# Patient Record
Sex: Female | Born: 1980
Health system: Southern US, Community
[De-identification: ages and names within clinical notes are randomized; demographics above are authoritative.]

## PROBLEM LIST (undated history)

## (undated) ENCOUNTER — Inpatient Hospital Stay (HOSPITAL_COMMUNITY): Payer: Self-pay

## (undated) DIAGNOSIS — Z8619 Personal history of other infectious and parasitic diseases: Secondary | ICD-10-CM

## (undated) DIAGNOSIS — Z5189 Encounter for other specified aftercare: Secondary | ICD-10-CM

## (undated) DIAGNOSIS — C719 Malignant neoplasm of brain, unspecified: Secondary | ICD-10-CM

## (undated) DIAGNOSIS — D573 Sickle-cell trait: Secondary | ICD-10-CM

## (undated) DIAGNOSIS — A599 Trichomoniasis, unspecified: Secondary | ICD-10-CM

## (undated) DIAGNOSIS — O09299 Supervision of pregnancy with other poor reproductive or obstetric history, unspecified trimester: Secondary | ICD-10-CM

## (undated) DIAGNOSIS — R87629 Unspecified abnormal cytological findings in specimens from vagina: Secondary | ICD-10-CM

## (undated) DIAGNOSIS — O139 Gestational [pregnancy-induced] hypertension without significant proteinuria, unspecified trimester: Secondary | ICD-10-CM

## (undated) DIAGNOSIS — F419 Anxiety disorder, unspecified: Secondary | ICD-10-CM

## (undated) DIAGNOSIS — N946 Dysmenorrhea, unspecified: Secondary | ICD-10-CM

## (undated) HISTORY — DX: Supervision of pregnancy with other poor reproductive or obstetric history, unspecified trimester: O09.299

## (undated) HISTORY — DX: Unspecified abnormal cytological findings in specimens from vagina: R87.629

## (undated) HISTORY — DX: Anxiety disorder, unspecified: F41.9

## (undated) HISTORY — DX: Personal history of other infectious and parasitic diseases: Z86.19

## (undated) HISTORY — DX: Trichomoniasis, unspecified: A59.9

## (undated) HISTORY — PX: CERVICAL CONE BIOPSY: SUR198

## (undated) HISTORY — DX: Malignant neoplasm of brain, unspecified: C71.9

## (undated) HISTORY — PX: BRAIN SURGERY: SHX531

---

## 2003-03-24 ENCOUNTER — Emergency Department (HOSPITAL_COMMUNITY): Admission: EM | Admit: 2003-03-24 | Discharge: 2003-03-24 | Payer: Self-pay | Admitting: Emergency Medicine

## 2004-06-12 ENCOUNTER — Emergency Department (HOSPITAL_COMMUNITY): Admission: EM | Admit: 2004-06-12 | Discharge: 2004-06-12 | Payer: Self-pay | Admitting: Emergency Medicine

## 2004-08-12 ENCOUNTER — Inpatient Hospital Stay (HOSPITAL_COMMUNITY): Admission: AD | Admit: 2004-08-12 | Discharge: 2004-08-16 | Payer: Self-pay | Admitting: General Surgery

## 2006-02-27 ENCOUNTER — Emergency Department (HOSPITAL_COMMUNITY): Admission: EM | Admit: 2006-02-27 | Discharge: 2006-02-27 | Payer: Self-pay | Admitting: Emergency Medicine

## 2006-09-05 ENCOUNTER — Encounter (INDEPENDENT_AMBULATORY_CARE_PROVIDER_SITE_OTHER): Payer: Self-pay | Admitting: Specialist

## 2006-09-05 ENCOUNTER — Other Ambulatory Visit: Admission: RE | Admit: 2006-09-05 | Discharge: 2006-09-05 | Payer: Self-pay | Admitting: Unknown Physician Specialty

## 2007-11-16 ENCOUNTER — Ambulatory Visit (HOSPITAL_COMMUNITY): Admission: RE | Admit: 2007-11-16 | Discharge: 2007-11-16 | Payer: Self-pay | Admitting: Family Medicine

## 2007-11-20 ENCOUNTER — Ambulatory Visit (HOSPITAL_COMMUNITY): Admission: RE | Admit: 2007-11-20 | Discharge: 2007-11-20 | Payer: Self-pay | Admitting: Family Medicine

## 2008-08-21 ENCOUNTER — Other Ambulatory Visit: Admission: RE | Admit: 2008-08-21 | Discharge: 2008-08-21 | Payer: Self-pay | Admitting: Obstetrics and Gynecology

## 2008-12-20 ENCOUNTER — Emergency Department (HOSPITAL_COMMUNITY): Admission: EM | Admit: 2008-12-20 | Discharge: 2008-12-20 | Payer: Self-pay | Admitting: Emergency Medicine

## 2009-03-12 ENCOUNTER — Ambulatory Visit: Payer: Self-pay | Admitting: Obstetrics & Gynecology

## 2009-03-12 ENCOUNTER — Inpatient Hospital Stay (HOSPITAL_COMMUNITY): Admission: AD | Admit: 2009-03-12 | Discharge: 2009-03-15 | Payer: Self-pay | Admitting: Obstetrics & Gynecology

## 2009-08-30 ENCOUNTER — Emergency Department (HOSPITAL_COMMUNITY): Admission: EM | Admit: 2009-08-30 | Discharge: 2009-08-30 | Payer: Self-pay | Admitting: Emergency Medicine

## 2009-09-07 ENCOUNTER — Emergency Department (HOSPITAL_COMMUNITY): Admission: EM | Admit: 2009-09-07 | Discharge: 2009-09-07 | Payer: Self-pay | Admitting: Emergency Medicine

## 2010-06-07 ENCOUNTER — Emergency Department (HOSPITAL_COMMUNITY)
Admission: EM | Admit: 2010-06-07 | Discharge: 2010-06-07 | Payer: Self-pay | Source: Home / Self Care | Admitting: Emergency Medicine

## 2010-10-15 LAB — CBC
HCT: 26.8 % — ABNORMAL LOW (ref 36.0–46.0)
HCT: 30.6 % — ABNORMAL LOW (ref 36.0–46.0)
MCHC: 34.3 g/dL (ref 30.0–36.0)
MCV: 106.8 fL — ABNORMAL HIGH (ref 78.0–100.0)
Platelets: 198 10*3/uL (ref 150–400)
RBC: 2.47 MIL/uL — ABNORMAL LOW (ref 3.87–5.11)
RDW: 13.3 % (ref 11.5–15.5)
WBC: 9.9 10*3/uL (ref 4.0–10.5)

## 2010-10-15 LAB — RPR: RPR Ser Ql: NONREACTIVE

## 2010-10-18 LAB — URINALYSIS, ROUTINE W REFLEX MICROSCOPIC
Bilirubin Urine: NEGATIVE
Hgb urine dipstick: NEGATIVE
Ketones, ur: NEGATIVE mg/dL
Nitrite: NEGATIVE
pH: 7 (ref 5.0–8.0)

## 2010-10-18 LAB — PREGNANCY, URINE: Preg Test, Ur: POSITIVE

## 2010-10-18 LAB — URINE MICROSCOPIC-ADD ON

## 2010-10-18 LAB — URINE CULTURE

## 2010-11-26 NOTE — Discharge Summary (Signed)
Maureen, Key         ACCOUNT NO.:  [id number]   MEDICAL RECORD NO.:  [id number]          PATIENT TYPE:  INP   LOCATION:  A311                          FACILITY:  [facility]   PHYSICIAN:  Annia Friendly. Loleta Chance, MD     DATE OF BIRTH:  [birth year]   DATE OF ADMISSION:  08/12/2004  DATE OF DISCHARGE:  02/06/2006LH                                 DISCHARGE SUMMARY   HISTORY OF PRESENT ILLNESS:  The patient is a 30 year old, single, gravida  0, unemployed black female from [place], [place].  She complains  of chills, general malaise, nausea, vomiting, diarrhea.  The patient had  been experiencing these symptoms x4 days.  She also experienced similar pain  with three episodes of watery brown stool on August 10, 2004.  History was  significant for runny nose (clear) x3 days, hot/cold shaking spells times  three days, headaches x4 days, nausea and vomiting less than one day.  Vomiting was described as clear.  She denied frequent cough, sore throat,  shortness of breath and wheezing.  The patient was exposed to a friend's  child with diarrhea approximately 5-6 days before admission.  History was  significant for soreness of lower back and stomach.  The patient was not  allergic to any medication.  Last menstrual cycle was on the day of  admission.   PAST SURGICAL HISTORY:  Hospitalization for meningitis at [facility] at age 82.   HABITS:  Positive for cigarette smoking (one pack per day); started at age  37.  Social use of ethanol since age 39.   FAMILY HISTORY:  Mother living at age 87, good health.  Father living at age  66, health unknown.  One sister living at age 73 with history of diabetes.   PROBLEMS:  #1 - Acute viral gastroenteritis.  General appearance revealed a  small frame, medium height, young adult black female in no apparent  respiratory distress.  The patient appeared not to feel well.  Vitals on  admission were as follows:  Temperature 104.2, pulse  128, respirations 20,  blood pressure 102/62.  Skin was hot and dry.  Sclerae were white.  Eyes  demonstrated no discharge.  Nose was negative of discharge.  Mouth  demonstrated piercing ring of tongue.  Posterior pharynx looked benign.  Neck negative for adenopathy or thyromegaly.  Lungs were clear.  Heart  revealed audible S1, S2, without murmur.  Rhythm was regular rate, was 96 at  the time of my exam.  Abdomen demonstrated no distention.  Abdominal exam  demonstrated hypoactive bowel signs.  Abdomen was positive for mid  epigastric tenderness and no palpable mass or organomegaly. Umbilical's  demonstrated piercing ring.  Back demonstrated some diffuse lumbar  tenderness.  Extremities demonstrated no joint swelling, redness or hotness.  Patient neurologically intact.  Significant labs on admission were as  follows: White count 7.4, hemoglobin 13.3, hematocrit 38.1, platelets  207,000, erythrocyte sedimentation rate 24.  Sodium 128, potassium 2.7,  chloride 94, CO2 28, glucose 121, BUN 8, creatinine 0.7, calcium 8.7.  Urinalysis:  Specific gravity 1.015, pH 5.0, no glucose, large amount  of  hemoglobin, ketone 15 mg/dL, protein 30 mg/dL, nitrate negative, 78-46  WBC's, 11-20 RBCs and many bacteria.  The patient was treated with IV  fluids, IV Levaquin, ordered urine culture, stool culture, stool for WBC, IV  fluids at 100 cc per hour x2, then 500 cc per hour x3, then 250 cc per hour,  Tamiflu 75 mg p.o. b.i.d., potassium 20 mEq p.o. at time of admission, 2200  and O800, thyroid panel, HIV test, Pepcid 20 mg IV q.12h., full liquid diet  and other supportive measures.  Urine culture was negative for growth.  Stool culture was negative at time of admission.  Stool did demonstrate  WBCs.  Electrolytes were corrected with IV fluids and potassium supplement.  Repeat met-7 on 08/13/2004 was as follows:  Sodium 140, potassium 4.0,  chloride 114, CO2 23, glucose 90, BUN 4, creatinine 0.7.  The  patient had a  chest x-ray on admission which demonstrated no active lung disease.  Ultrasound of kidney on 08/13/2004 was read as normal bladder.  Right kidney  demonstrated no evidence of hydronephrosis.  Ultrasound of left kidney  demonstrated no evidence of hydronephrosis.  Conclusion was normal urinary  tract ultrasound, specifically, no evidence of pyelonephritis read by Dr.  Hulan Saas.  Erythrocyte sedimentation rate was 24.  The patient  continued to improve during this hospitalization.  HIV test was nonreactive.  Thyroid panel demonstrated the following:  T3 uptake 45.0% (normal 22.5-  37.0), TSH 3.001 (normal 0.35-5.5 mIU) and T4 0.97 ng/dL (normal 9.62-9.52).  The patient's diet was advanced at the end of hospitalization.  She was  observed ambulatory in the hall without any discomfort.  She was keeping  down regular diet at the time of discharge.  She was alert and oriented to  person, place and time at time of discharge.  She had no complaints of  fever, chills or general malaise at time of discharge.  She was discharged  to her home where she lives with her grandmother.  Abdominal exam  demonstrated no significant tenderness at time of discharge.   #2 - Sickle cell screen positive.  The patient had a family history of  sickle cell disease.  Therefore, sickle cell screen was done and it was  positive.  Hemoglobin electrophoresis results were pending at time of  discharge.  Results will be followed as outpatient.   #3 - Street drug used.  Urine drug screen was positive for marijuana.  The  patient will be encouraged to seek counseling at [facility].   #4 - Dysmenorrhea.  During this hospitalization, the patient's menstrual  cycle came on.  She has experienced some discomfort involving back and lower  abdomen at times.  Her menstrual cycle was still on at time of discharge.   DISCHARGE INSTRUCTIONS:  1.  Diet:  Bland. 2.  Activity:  No restrictions.   DISCHARGE  MEDICATIONS:  1.  Zantac 75 mg one tablet b.i.d. (over-the-counter) x1 week.  2.  Tylenol 500 mg one tablet q.6h. p.r.n. pain.   FOLLOWUP:  Follow up with Dr. Harrell Lark Presnell in one week.   PRIMARY DIAGNOSES:  1.  Acute viral gastroenteritis.  2.  Electrolyte abnormality secondary to #1.   SECONDARY DIAGNOSES:  1.  Positive sickle cell screen test.  2.  Street drugs used.      GKH/MEDQ  D:  08/16/2004  T:  08/17/2004  Job:  [id number]

## 2010-11-26 NOTE — H&P (Signed)
NAMEFLORENE, BRILL         ACCOUNT NO.:  [id number]   MEDICAL RECORD NO.:  [id number]          PATIENT TYPE:  INP   LOCATION:  A310                          FACILITY:  [facility]   PHYSICIAN:  Annia Friendly. Loleta Chance, MD     DATE OF BIRTH:  07-11-1981   DATE OF ADMISSION:  08/12/2004  DATE OF DISCHARGE:  LH                                HISTORY & PHYSICAL   The patient is a 30 year old single gravida 0 unemployed black female from  [place], [place].  Chief complaints are chills, general malaise,  nausea, vomiting and diarrhea.   HISTORY OF PRESENT ILLNESS:  Patient has been experiencing these symptoms  over 4 days.  She incurred stomach pain with three episodes of watery brown  stool on August 10, 2004.  History is significant for a runny nose (clear)  x3 days, hot-cold shaking spells x3 days, headache x4 day, nausea and  vomiting less than 1 day.  Vomitus is described as clear.  She denied  frequent cough, sore throat, shortness of breath and wheezing.  Patient was  exposed to friend's child with the diarrhea approximately 5-6 days before  admission.  History is significant for soreness of lower back and stomach.   MEDICAL HISTORY:  Negative for hypertension, diabetes, tuberculosis, cancer,  sickle cell, asthma, seizure disorder.   PRESCRIBED MEDICATIONS ON ADMISSION:  None.   MEDICATION:  The patient is not allergic to any known medication.   LAST MENSTRUAL PERIOD:  Menstrual cycle came on on the day of admission.   PAST MEDICAL HISTORY:  Positive for hospitalization for meningitis at [facility] age 56 year.   HABITS:  Positive for cigarette smoking (six per day, started age 88) and  social use of ethanol (since age 56).  Patient denied using street drugs.   SEXUALLY TRANSMITTED DISEASE HISTORY:  Negative for gonorrhea, syphilis,  herpes and HIV infection.   FAMILY HISTORY:  Mother living age 56 good health; father living age 40  health unknown; one sister  living age 58 with history of diabetes.   REVIEW OF SYSTEMS:  Negative for epistaxis, bleeding gums, dysphagia,  hemoptysis, hematemesis, gross hematuria, melena, joint pain, joint hotness,  joint redness, dysuria, change in urinary frequency, vaginal discharge, etc.   PHYSICAL EXAMINATION:  GENERAL APPEARANCE:  Small frame, medium height,  young adult black female in no apparent respiratory distress.  Patient  appeared not to feel well.  VITALS:  Temperature 104.2, pulse 128, respirations 20, blood pressure  102/62.  SKIN:  Hot and dry.  HEAD:  Normocephalic.  EARS:  Normal auricles, external canals patent, tympanic membranes pearly  gray.  EYES:  Lids negative for ptosis.  Sclerae white.  Pupils round, equal and  reactive to light.  Extraocular movement intact.  Negative for discharge.  NOSE:  Negative for discharge.  MOUTH:  Tongue positive for ring piercing.  No oral lesion.  Posterior  pharynx benign.  NECK:  Negative for lymphadenopathy or thyromegaly.  Positive for tattoo  butterfly like on right side of neck.  Supraclavicular space no palpable  nodes.  LUNGS:  Clear.  BACK:  Lumbar positive for diffuse tenderness on palpation.  Positive for  tattoo in the form of a name.  HEART:  Audible S1/S2 without murmur, regular rhythm, rate of 96.  BREASTS:  No skin changes.  No nodules on palpation.  Nipples erect without  discharge.  ABDOMEN:  No distention.  Hyperactive bowel sounds, soft, positive  midepigastric tenderness.  No palpable masses.  No organomegaly.  Umbilicus  positive for ring piercing.  PELVIC:  Deferred.  RECTAL:  Deferred.  EXTREMITIES:  Upper left hand dorsal aspect positive for tattoo.  Lower no  tibia edema, no joint swelling, no joint redness, no joint hotness.  NEUROLOGIC:  Alert and oriented to person, place and time.  Cranial nerves  II-XII appeared intact.   LABORATORIES:  White count is 7.4, hemoglobin 13.3, hematocrit 38.1,  platelets 207,000,  sodium 128, potassium 2.7, chloride 94, CO2 28, glucose  121, BUN 8, creatinine 0.9.   IMPRESSION:  Primary acute bowel gastroenteritis.   SECONDARY DIAGNOSIS:  Hypokalemia.   PLAN:  IV fluid normal saline at 1000 mL/hr x2 then 500 mL/hr x4 then 250  mL/hr.  Potassium supplement p.o. 20 mEq now at 2300 hours and 0800 hours.  Repeat CBC and MET-7 in the morning.  Tamiflu 75 mg p.o. b.i.d. x5 days.  Pepcid 20 mg IV q.12h.  Diet full liquid.  Tylenol 325 mg p.o. q.4-6h. for  temperature 101 or greater.  Chest x-ray.  Urinalysis.  Erythrocyte  sedimentation rate.  Ultrasound as ordered by Dr. Katrinka Blazing.  Watch I's and O's  (input and output).      GKH/MEDQ  D:  08/12/2004  T:  08/12/2004  Job:  [id number]

## 2012-03-26 ENCOUNTER — Emergency Department (HOSPITAL_COMMUNITY)
Admission: EM | Admit: 2012-03-26 | Discharge: 2012-03-26 | Disposition: A | Discharge status: Home / Self Care | Payer: Self-pay | Attending: Emergency Medicine | Admitting: Emergency Medicine

## 2012-03-26 ENCOUNTER — Encounter (HOSPITAL_COMMUNITY): Payer: Self-pay | Admitting: *Deleted

## 2012-03-26 DIAGNOSIS — F17200 Nicotine dependence, unspecified, uncomplicated: Secondary | ICD-10-CM | POA: Insufficient documentation

## 2012-03-26 DIAGNOSIS — T6391XA Toxic effect of contact with unspecified venomous animal, accidental (unintentional), initial encounter: Secondary | ICD-10-CM | POA: Insufficient documentation

## 2012-03-26 DIAGNOSIS — T63461A Toxic effect of venom of wasps, accidental (unintentional), initial encounter: Secondary | ICD-10-CM | POA: Insufficient documentation

## 2012-03-26 DIAGNOSIS — T63441A Toxic effect of venom of bees, accidental (unintentional), initial encounter: Secondary | ICD-10-CM

## 2012-03-26 MED ORDER — HYDROXYZINE HCL 25 MG PO TABS
25.0000 mg | ORAL_TABLET | Freq: Once | ORAL | Status: AC
  Administered 2012-03-26: 25 mg via ORAL
  Filled 2012-03-26: qty 1

## 2012-03-26 MED ORDER — HYDROXYZINE PAMOATE 25 MG PO CAPS: ORAL_CAPSULE | ORAL | Status: DC

## 2012-03-26 MED ORDER — PREDNISONE 20 MG PO TABS
60.0000 mg | ORAL_TABLET | Freq: Once | ORAL | Status: AC
  Administered 2012-03-26: 60 mg via ORAL
  Filled 2012-03-26: qty 3

## 2012-03-26 MED ORDER — PREDNISONE 10 MG PO TABS: ORAL_TABLET | ORAL | Status: DC

## 2012-03-26 NOTE — ED Notes (Addendum)
Bee sting x2  To  Rt leg, red , swollen, painful.  No resp distress. No rashes.

## 2012-03-26 NOTE — ED Provider Notes (Signed)
History     CSN: [id number]  Arrival date & time 03/26/12  1137   First MD Initiated Contact with Patient 03/26/12 1330      Chief Complaint  Patient presents with  . Insect Bite    (Consider location/radiation/quality/duration/timing/severity/associated sxs/prior treatment) HPI Comments: Just prior to arrival to the emergency room the patient sustained 2E stings while at a gas station. The patient complains of a skin burning sensation in the lower leg at the site of the stings. The patient denies difficulty with breathing, swelling of the face tongue or throat. There've been no reported hives or other rash appreciated. Is no reported problem with previous bee stings.  The history is provided by the patient.    History reviewed. No pertinent past medical history.  History reviewed. No pertinent past surgical history.  History reviewed. No pertinent family history.  History  Substance Use Topics  . Smoking status: Current Every Day Smoker  . Smokeless tobacco: Not on file  . Alcohol Use: Yes     occ    OB History    Grav Para Term Preterm Abortions TAB SAB Ect Mult Living                  Review of Systems  Constitutional: Negative for activity change.       All ROS Neg except as noted in HPI  HENT: Negative for nosebleeds and neck pain.   Eyes: Negative for photophobia and discharge.  Respiratory: Negative for cough, shortness of breath and wheezing.   Cardiovascular: Negative for chest pain and palpitations.  Gastrointestinal: Negative for abdominal pain and blood in stool.  Genitourinary: Negative for dysuria, frequency and hematuria.  Musculoskeletal: Negative for back pain and arthralgias.  Skin: Negative.   Neurological: Negative for dizziness, seizures and speech difficulty.  Psychiatric/Behavioral: Negative for hallucinations and confusion.    Allergies  Review of patient's allergies indicates no known allergies.  Home Medications   Current  Outpatient Rx  Name Route Sig Dispense Refill  . ETONOGESTREL 68 MG [place] IMPL Subcutaneous Inject 1 each into the skin once.    Marland Kitchen HYDROXYZINE PAMOATE 25 MG PO CAPS  1 po q6h prn itching/burning rash 20 capsule 0  . PREDNISONE 10 MG PO TABS  5,4,3,2,1 - take with food 15 tablet 0    BP 99/73  Pulse 94  Temp 99.1 F (37.3 C) (Oral)  Resp 18  Ht 5' (1.524 m)  Wt 105 lb (47.628 kg)  BMI 20.51 kg/m2  SpO2 100%  Physical Exam  Nursing note and vitals reviewed. Constitutional: She is oriented to person, place, and time. She appears well-developed and well-nourished.  Non-toxic appearance.  HENT:  Head: Normocephalic.  Right Ear: Tympanic membrane and external ear normal.  Left Ear: Tympanic membrane and external ear normal.       No facial, mouth, or tongue area swelling.  Eyes: EOM and lids are normal. Pupils are equal, round, and reactive to light.  Neck: Normal range of motion. Neck supple. Carotid bruit is not present.  Cardiovascular: Normal rate, regular rhythm, normal heart sounds, intact distal pulses and normal pulses.   Pulmonary/Chest: Breath sounds normal. No respiratory distress.  Abdominal: Soft. Bowel sounds are normal. There is no tenderness. There is no guarding.  Musculoskeletal: Normal range of motion.       There is a small slightly raised red area of the upper calf of the left leg, as well as a small slightly raised red area of  the lower portion of the left eye. There is full range of motion of the right and left lower extremities.  Lymphadenopathy:       Head (right side): No submandibular adenopathy present.       Head (left side): No submandibular adenopathy present.    She has no cervical adenopathy.  Neurological: She is alert and oriented to person, place, and time. She has normal strength. No cranial nerve deficit or sensory deficit.  Skin: Skin is warm and dry.       No hives  Psychiatric: She has a normal mood and affect. Her speech is normal.    ED  Course  Procedures (including critical care time)  Labs Reviewed - No data to display No results found.   1. Bee sting       MDM  I have reviewed nursing notes, vital signs, and all appropriate lab and imaging results for this patient. The patient sustained 2 bee stings to the left lower extremity. The patient is not experiencing any form of anaphylaxis. She has been observed in the emergency department for nearly 2 hours and no anaphylaxis appreciated. The patient history needed with prednisone taper and Vistaril. The patient is advised to return immediately if any changes, problems, or concerns.       Kathie Dike, [place] 03/26/12 [id number]

## 2012-03-27 NOTE — ED Provider Notes (Signed)
Medical screening examination/treatment/procedure(s) were performed by non-physician practitioner and as supervising physician I was immediately available for consultation/collaboration.   Darnise Montag W. Hawke Villalpando, MD 03/27/12 2122 

## 2012-12-17 ENCOUNTER — Emergency Department (HOSPITAL_COMMUNITY): Payer: Self-pay

## 2012-12-17 ENCOUNTER — Emergency Department (HOSPITAL_COMMUNITY)
Admission: EM | Admit: 2012-12-17 | Discharge: 2012-12-17 | Disposition: A | Discharge status: Home / Self Care | Payer: Self-pay | Attending: Emergency Medicine | Admitting: Emergency Medicine

## 2012-12-17 ENCOUNTER — Encounter (HOSPITAL_COMMUNITY): Payer: Self-pay

## 2012-12-17 DIAGNOSIS — Z3202 Encounter for pregnancy test, result negative: Secondary | ICD-10-CM | POA: Insufficient documentation

## 2012-12-17 DIAGNOSIS — R1084 Generalized abdominal pain: Secondary | ICD-10-CM | POA: Insufficient documentation

## 2012-12-17 DIAGNOSIS — R1110 Vomiting, unspecified: Secondary | ICD-10-CM | POA: Insufficient documentation

## 2012-12-17 DIAGNOSIS — R109 Unspecified abdominal pain: Secondary | ICD-10-CM

## 2012-12-17 DIAGNOSIS — N939 Abnormal uterine and vaginal bleeding, unspecified: Secondary | ICD-10-CM | POA: Insufficient documentation

## 2012-12-17 DIAGNOSIS — N926 Irregular menstruation, unspecified: Secondary | ICD-10-CM | POA: Insufficient documentation

## 2012-12-17 DIAGNOSIS — F17200 Nicotine dependence, unspecified, uncomplicated: Secondary | ICD-10-CM | POA: Insufficient documentation

## 2012-12-17 DIAGNOSIS — Z79899 Other long term (current) drug therapy: Secondary | ICD-10-CM | POA: Insufficient documentation

## 2012-12-17 DIAGNOSIS — Z862 Personal history of diseases of the blood and blood-forming organs and certain disorders involving the immune mechanism: Secondary | ICD-10-CM | POA: Insufficient documentation

## 2012-12-17 DIAGNOSIS — Z8742 Personal history of other diseases of the female genital tract: Secondary | ICD-10-CM | POA: Insufficient documentation

## 2012-12-17 HISTORY — DX: Sickle-cell trait: D57.3

## 2012-12-17 HISTORY — DX: Dysmenorrhea, unspecified: N94.6

## 2012-12-17 LAB — URINALYSIS, ROUTINE W REFLEX MICROSCOPIC
Bilirubin Urine: NEGATIVE
Ketones, ur: 15 mg/dL — AB
Leukocytes, UA: NEGATIVE
Nitrite: NEGATIVE
Specific Gravity, Urine: <1.005 — ABNORMAL LOW (ref 1.005–1.030)

## 2012-12-17 LAB — COMPREHENSIVE METABOLIC PANEL
AST: 29 U/L (ref 0–37)
Albumin: 3.4 g/dL — ABNORMAL LOW (ref 3.5–5.2)
BUN: 9 mg/dL (ref 6–23)
Calcium: 9.3 mg/dL (ref 8.4–10.5)
Creatinine, Ser: 0.63 mg/dL (ref 0.50–1.10)
Total Bilirubin: 0.4 mg/dL (ref 0.3–1.2)
Total Protein: 6.9 g/dL (ref 6.0–8.3)

## 2012-12-17 LAB — CBC WITH DIFFERENTIAL/PLATELET
Basophils Absolute: 0 10*3/uL (ref 0.0–0.1)
Eosinophils Absolute: 0 10*3/uL (ref 0.0–0.7)
HCT: 33.1 % — ABNORMAL LOW (ref 36.0–46.0)
Lymphs Abs: 0.8 10*3/uL (ref 0.7–4.0)
MCHC: 36 g/dL (ref 30.0–36.0)
MCV: 94.8 fL (ref 78.0–100.0)
Monocytes Absolute: 0.4 10*3/uL (ref 0.1–1.0)
Neutro Abs: 10.9 10*3/uL — ABNORMAL HIGH (ref 1.7–7.7)
Platelets: 154 10*3/uL (ref 150–400)
RDW: 12.3 % (ref 11.5–15.5)
WBC: 12.1 10*3/uL — ABNORMAL HIGH (ref 4.0–10.5)

## 2012-12-17 LAB — URINE MICROSCOPIC-ADD ON

## 2012-12-17 LAB — PREGNANCY, URINE: Preg Test, Ur: NEGATIVE

## 2012-12-17 LAB — LIPASE, BLOOD: Lipase: 11 U/L (ref 11–59)

## 2012-12-17 MED ORDER — ONDANSETRON 8 MG PO TBDP
8.0000 mg | ORAL_TABLET | Freq: Once | ORAL | Status: AC
  Administered 2012-12-17: 8 mg via ORAL
  Filled 2012-12-17: qty 1

## 2012-12-17 MED ORDER — KETOROLAC TROMETHAMINE 60 MG/2ML IM SOLN
60.0000 mg | Freq: Once | INTRAMUSCULAR | Status: AC
Start: 2012-12-17 — End: 2012-12-17
  Administered 2012-12-17: 60 mg via INTRAMUSCULAR
  Filled 2012-12-17: qty 2

## 2012-12-17 MED ORDER — HYDROCODONE-ACETAMINOPHEN 5-325 MG PO TABS: ORAL_TABLET | ORAL | Status: DC

## 2012-12-17 MED ORDER — OXYCODONE-ACETAMINOPHEN 5-325 MG PO TABS
1.0000 | ORAL_TABLET | Freq: Once | ORAL | Status: AC
  Administered 2012-12-17: 1 via ORAL
  Filled 2012-12-17: qty 1

## 2012-12-17 MED ORDER — POTASSIUM CHLORIDE 20 MEQ/15ML (10%) PO LIQD
40.0000 meq | Freq: Once | ORAL | Status: AC
  Administered 2012-12-17: 40 meq via ORAL
  Filled 2012-12-17: qty 30

## 2012-12-17 MED ORDER — NAPROXEN 250 MG PO TABS: 250.0000 mg | ORAL_TABLET | Freq: Two times a day (BID) | ORAL | Status: DC

## 2012-12-17 NOTE — ED Notes (Addendum)
To restroom with assistance.  States that she needs a wheelchair because the pain is too great for her to walk, however does walk with stand by assist x1.

## 2012-12-17 NOTE — ED Notes (Signed)
See edp h and p for complete note, pt was seen at [facility] yesterday and returned today for a second opinion. Would also like a work note.  She was only given 1 day off by [facility].

## 2012-12-17 NOTE — ED Notes (Addendum)
Pt c/o generalized abd pain with n/v/d since Friday.  Went to [facility] yesterday and had CT scan.  Pt said the ct was neg and pt was diagnosed with "acute abd pain."  Pt says also had pelvic exam yesterday.  Reports was given IV demerol, IM phenergan and IM antibiotic.  Was given a prescription for nausea medication and flagyl but says didn't want to fill them until she got a second opinion.

## 2012-12-17 NOTE — ED Provider Notes (Signed)
History     CSN: [id number]  Arrival date & time 12/17/12  1247   First MD Initiated Contact with Patient 12/17/12 1542      Chief Complaint  Patient presents with  . Abdominal Pain  . Emesis    HPI Pt was seen at 1545.   Per pt, c/o gradual onset and persistence of constant generalized abd "pain" for the past 4 days.  Has been associated with several intermittent episodes of N/V/D as well as vaginal bleeding.  Describes the abd pain as "cramping."  States she has implantable birth control for several years, and "haven't bled in a while since I've been on it."  States she was evaluated at [facility] ED yesterday for same with physical exam (including pelvic exam), CT scan and labs; dx with "abd pain," and rx cipro and flagyl. States she has come to this ED today "for another opinion before I take the medicines" and "a work note because they only gave me yesterday off and I need another day."  Denies any change in her symptoms. Denies vaginal discharge, no fevers, no flank/back pain, no rash, no CP/SOB, no black or blood in stools or emesis.        Past Medical History  Diagnosis Date  . Sickle cell trait   . Dysmenorrhea     Past Surgical History  Procedure Laterality Date  . Cervical cone biopsy       History  Substance Use Topics  . Smoking status: Current Every Day Smoker  . Smokeless tobacco: Not on file  . Alcohol Use: Yes     Comment: occ      Review of Systems ROS: Statement: All systems negative except as marked or noted in the HPI; Constitutional: Negative for fever and chills. ; ; Eyes: Negative for eye pain, redness and discharge. ; ; ENMT: Negative for ear pain, hoarseness, nasal congestion, sinus pressure and sore throat. ; ; Cardiovascular: Negative for chest pain, palpitations, diaphoresis, dyspnea and peripheral edema. ; ; Respiratory: Negative for cough, wheezing and stridor. ; ; Gastrointestinal: +N/V/D, abd pain. Negative for blood in stool, hematemesis,  jaundice and rectal bleeding. . ; ; Genitourinary: Negative for dysuria, flank pain and hematuria. ; ; GYN:  +vaginal bleeding, no vaginal discharge, no vulvar pain.;; Musculoskeletal: Negative for back pain and neck pain. Negative for swelling and trauma.; ; Skin: Negative for pruritus, rash, abrasions, blisters, bruising and skin lesion.; ; Neuro: Negative for headache, lightheadedness and neck stiffness. Negative for weakness, altered level of consciousness , altered mental status, extremity weakness, paresthesias, involuntary movement, seizure and syncope.       Allergies  Review of patient's allergies indicates no known allergies.  Home Medications   Current Outpatient Rx  Name  Route  Sig  Dispense  Refill  . ibuprofen (ADVIL,MOTRIN) 200 MG tablet   Oral   Take 200 mg by mouth every 6 (six) hours as needed for pain.         Marland Kitchen etonogestrel (IMPLANON) 68 MG IMPL implant   Subcutaneous   Inject 1 each into the skin once.         Marland Kitchen HYDROcodone-acetaminophen (NORCO/VICODIN) 5-325 MG per tablet      1 or 2 tabs PO q6 hours prn pain   20 tablet   0   . naproxen (NAPROSYN) 250 MG tablet   Oral   Take 1 tablet (250 mg total) by mouth 2 (two) times daily with a meal.   14 tablet  0     BP 98/65  Pulse 102  Temp(Src) 100 F (37.8 C) (Oral)  Resp 18  Ht 5\' 1"  (1.549 m)  Wt 106 lb (48.081 kg)  BMI 20.04 kg/m2  SpO2 100%  Physical Exam 1550: Physical examination:  Nursing notes reviewed; Vital signs and O2 SAT reviewed;  Constitutional: Well developed, Well nourished, Well hydrated, In no acute distress when distracted; Head:  Normocephalic, atraumatic; Eyes: EOMI, PERRL, No scleral icterus; ENMT: Mouth and pharynx normal, Mucous membranes moist; Neck: Supple, Full range of motion, No lymphadenopathy; Cardiovascular: Regular rate and rhythm, No murmur, rub, or gallop; Respiratory: Breath sounds clear & equal bilaterally, No rales, rhonchi, wheezes.  Speaking full sentences  with ease, Normal respiratory effort/excursion; Chest: Nontender, Movement normal; Abdomen: Soft, +diffusely tender to palp. No rebound or guarding. Nondistended, Normal bowel sounds; Genitourinary: No CVA tenderness; Extremities: Pulses normal, No tenderness, No edema, No calf edema or asymmetry.; Neuro: AA&Ox3, Major CN grossly intact.  Speech clear. No gross focal motor or sensory deficits in extremities.; Skin: Color normal, Warm, Dry.   ED Course  Procedures  1600:  When distracted, pt stops crying, appears NAD, resps easy and speaks calmly. Pt informed that I will medicate her for pain, but that I will not repeat a CT of her abd because she just had one yesterday. Pt and family verb understanding. [facility] ED Records receipt pending.   1730:  Received ED records from [facility]: WBC count 16, +BV on wet prep, +THC on UDS, CT A/P: "suggestion of mild dilatation of the proximal small bowel, thickening of the distal small bowel and some associated fluid in the proximal colon. Most likely etiology of findings is some type of enteritis. Inflammatory bowel disease if felt less likely. There is no evidence of acute appendicitis or focal abscess."  Pt was rx cipro and flagyl. Will recheck UA/preg, AXR, labs today.  Explained to pt that I will not re-CT scan her today, as she just had one yesterday; verb understanding.    MDM  MDM Reviewed: previous chart, nursing note and vitals Reviewed previous: labs and CT scan Interpretation: labs, ultrasound and x-ray   Results for orders placed during the hospital encounter of 12/17/12  URINALYSIS, ROUTINE W REFLEX MICROSCOPIC      Result Value Range   Color, Urine YELLOW  YELLOW   APPearance CLEAR  CLEAR   Specific Gravity, Urine <1.005 (*) 1.005 - 1.030   pH 6.0  5.0 - 8.0   Glucose, UA NEGATIVE  NEGATIVE mg/dL   Hgb urine dipstick LARGE (*) NEGATIVE   Bilirubin Urine NEGATIVE  NEGATIVE   Ketones, ur 15 (*) NEGATIVE mg/dL   Protein, ur TRACE (*)  NEGATIVE mg/dL   Urobilinogen, UA 0.2  0.0 - 1.0 mg/dL   Nitrite NEGATIVE  NEGATIVE   Leukocytes, UA NEGATIVE  NEGATIVE  PREGNANCY, URINE      Result Value Range   Preg Test, Ur NEGATIVE  NEGATIVE  URINE MICROSCOPIC-ADD ON      Result Value Range   Squamous Epithelial / LPF MANY (*) RARE   WBC, UA 7-10  <3 WBC/hpf   RBC / HPF 0-2  <3 RBC/hpf   Bacteria, UA MANY (*) RARE  CBC WITH DIFFERENTIAL      Result Value Range   WBC 12.1 (*) 4.0 - 10.5 K/uL   RBC 3.49 (*) 3.87 - 5.11 MIL/uL   Hemoglobin 11.9 (*) 12.0 - 15.0 g/dL   HCT 19.1 (*) 47.8 - 29.5 %  MCV 94.8  78.0 - 100.0 fL   MCH 34.1 (*) 26.0 - 34.0 pg   MCHC 36.0  30.0 - 36.0 g/dL   RDW 16.1  09.6 - 04.5 %   Platelets 154  150 - 400 K/uL   Neutrophils Relative % 90 (*) 43 - 77 %   Lymphocytes Relative 7 (*) 12 - 46 %   Monocytes Relative 3  3 - 12 %   Eosinophils Relative 0  0 - 5 %   Basophils Relative 0  0 - 1 %   Neutro Abs 10.9 (*) 1.7 - 7.7 K/uL   Lymphs Abs 0.8  0.7 - 4.0 K/uL   Monocytes Absolute 0.4  0.1 - 1.0 K/uL   Eosinophils Absolute 0.0  0.0 - 0.7 K/uL   Basophils Absolute 0.0  0.0 - 0.1 K/uL   WBC Morphology ATYPICAL LYMPHOCYTES    COMPREHENSIVE METABOLIC PANEL      Result Value Range   Sodium 140  135 - 145 mEq/L   Potassium 3.0 (*) 3.5 - 5.1 mEq/L   Chloride 102  96 - 112 mEq/L   CO2 26  19 - 32 mEq/L   Glucose, Bld 91  70 - 99 mg/dL   BUN 9  6 - 23 mg/dL   Creatinine, Ser 4.09  0.50 - 1.10 mg/dL   Calcium 9.3  8.4 - 81.1 mg/dL   Total Protein 6.9  6.0 - 8.3 g/dL   Albumin 3.4 (*) 3.5 - 5.2 g/dL   AST 29  0 - 37 U/L   ALT 14  0 - 35 U/L   Alkaline Phosphatase 68  39 - 117 U/L   Total Bilirubin 0.4  0.3 - 1.2 mg/dL   GFR calc non Af Amer >90  >90 mL/min   GFR calc Af Amer >90  >90 mL/min  LIPASE, BLOOD      Result Value Range   Lipase 11  11 - 59 U/L   US Transvaginal Non-ob 12/17/2012   *RADIOLOGY REPORT*  Clinical Data: Vaginal bleeding, pain  TRANSABDOMINAL AND TRANSVAGINAL ULTRASOUND OF  PELVIS Technique:  Both transabdominal and transvaginal ultrasound examinations of the pelvis were performed. Transabdominal technique was performed for global imaging of the pelvis including uterus, ovaries, adnexal regions, and pelvic cul-de-sac.  It was necessary to proceed with endovaginal exam following the transabdominal exam to visualize the uterus and bilateral ovaries.  Comparison:  None  Findings:  Uterus: Normal in size and appearance, measuring 5.7 x 3.1 x 4.4 cm.  Endometrium: Normal in thickness and appearance, measuring 5 mm.  Right ovary:  Normal appearance/no adnexal mass, measuring 3.3 x 2.3 x 3.2 cm.  Left ovary: Normal appearance/no adnexal mass, measuring 2.5 x 1.8 x 3.2 cm.  Other findings: Small volume pelvic fluid/hemorrhage, likely physiologic.  IMPRESSION: Small volume pelvic fluid/hemorrhage, likely physiologic.  Otherwise negative pelvic ultrasound.   Original Report Authenticated By: Charline Bills, M.D.   Dg Abd Acute W/chest 12/17/2012   *RADIOLOGY REPORT*  Clinical Data: Abdominal pain, distention and vomiting.  History sickle cell anemia.  ACUTE ABDOMEN SERIES (ABDOMEN 2 VIEW & CHEST 1 VIEW)  Comparison:  CT of the abdomen and pelvis performed at [facility] on 12/16/2012.  Findings:  There is no evidence of dilated bowel loops or free intraperitoneal air.  No radiopaque calculi or other significant radiographic abnormality is seen. Oral contrast administered yesterday for CT is identified and a nondilated colon.  Heart size and mediastinal contours are within normal limits.  Both lungs are clear.  IMPRESSION: Negative abdominal radiographs.  No acute cardiopulmonary disease.   Original Report Authenticated By: Irish Lack, M.D.    1930:  WBC improved today. No SBO on AXR.  Potassium repleted PO.  Pt has tol PO well while in the ED without N/V.  No stooling while in the ED.  Abd benign, VSS. Wants to go home now. Encouraged to fill her rx from her ED visit at  [facility] yesterday. Dx and testing d/w pt and family.  Questions answered.  Verb understanding, agreeable to d/c home with outpt f/u.            Laray Anger, DO 12/20/12 0129

## 2012-12-17 NOTE — ED Notes (Signed)
Pt alert & oriented x4, stable gait. Patient given discharge instructions, paperwork & prescription(s). Patient  instructed to stop at the registration desk to finish any additional paperwork. Patient verbalized understanding. Pt left department w/ no further questions. 

## 2012-12-19 LAB — URINE CULTURE: Colony Count: NO GROWTH

## 2015-09-29 DIAGNOSIS — O26879 Cervical shortening, unspecified trimester: Secondary | ICD-10-CM

## 2016-09-21 LAB — OB RESULTS CONSOLE GC/CHLAMYDIA
CHLAMYDIA, DNA PROBE: NEGATIVE
Chlamydia: NEGATIVE
GC PROBE AMP, GENITAL: NEGATIVE
GC PROBE AMP, GENITAL: NEGATIVE

## 2016-09-21 LAB — OB RESULTS CONSOLE HIV ANTIBODY (ROUTINE TESTING)
HIV: NONREACTIVE
HIV: NONREACTIVE

## 2016-09-21 LAB — OB RESULTS CONSOLE RUBELLA ANTIBODY, IGM
Rubella: IMMUNE
Rubella: IMMUNE

## 2016-09-21 LAB — OB RESULTS CONSOLE HGB/HCT, BLOOD
HEMATOCRIT: 35
HEMOGLOBIN: 12.2

## 2016-09-21 LAB — OB RESULTS CONSOLE ABO/RH: RH TYPE: POSITIVE

## 2016-09-21 LAB — OB RESULTS CONSOLE PLATELET COUNT: PLATELETS: 174

## 2016-09-21 LAB — OB RESULTS CONSOLE HEPATITIS B SURFACE ANTIGEN
Hepatitis B Surface Ag: NEGATIVE
Hepatitis B Surface Ag: NEGATIVE

## 2016-09-21 LAB — OB RESULTS CONSOLE RPR
RPR: NONREACTIVE
RPR: NONREACTIVE

## 2016-09-21 LAB — OB RESULTS CONSOLE ANTIBODY SCREEN: Antibody Screen: NEGATIVE

## 2017-01-12 ENCOUNTER — Encounter: Payer: Self-pay | Admitting: *Deleted

## 2017-01-18 ENCOUNTER — Ambulatory Visit: Payer: Medicaid Other | Admitting: *Deleted

## 2017-01-18 ENCOUNTER — Ambulatory Visit (INDEPENDENT_AMBULATORY_CARE_PROVIDER_SITE_OTHER): Payer: Medicaid Other | Admitting: Advanced Practice Midwife

## 2017-01-18 ENCOUNTER — Encounter: Payer: Self-pay | Admitting: Advanced Practice Midwife

## 2017-01-18 VITALS — BP 94/68 | HR 97 | Wt 106.0 lb

## 2017-01-18 DIAGNOSIS — Z3482 Encounter for supervision of other normal pregnancy, second trimester: Secondary | ICD-10-CM

## 2017-01-18 DIAGNOSIS — Z331 Pregnant state, incidental: Secondary | ICD-10-CM

## 2017-01-18 DIAGNOSIS — O0932 Supervision of pregnancy with insufficient antenatal care, second trimester: Secondary | ICD-10-CM | POA: Diagnosis not present

## 2017-01-18 DIAGNOSIS — Z1389 Encounter for screening for other disorder: Secondary | ICD-10-CM

## 2017-01-18 DIAGNOSIS — Z3A22 22 weeks gestation of pregnancy: Secondary | ICD-10-CM

## 2017-01-18 DIAGNOSIS — Z8751 Personal history of pre-term labor: Secondary | ICD-10-CM

## 2017-01-18 DIAGNOSIS — A599 Trichomoniasis, unspecified: Secondary | ICD-10-CM

## 2017-01-18 DIAGNOSIS — Z363 Encounter for antenatal screening for malformations: Secondary | ICD-10-CM

## 2017-01-18 DIAGNOSIS — Z3490 Encounter for supervision of normal pregnancy, unspecified, unspecified trimester: Secondary | ICD-10-CM | POA: Insufficient documentation

## 2017-01-18 DIAGNOSIS — O98312 Other infections with a predominantly sexual mode of transmission complicating pregnancy, second trimester: Secondary | ICD-10-CM | POA: Diagnosis not present

## 2017-01-18 DIAGNOSIS — O0930 Supervision of pregnancy with insufficient antenatal care, unspecified trimester: Secondary | ICD-10-CM

## 2017-01-18 LAB — POCT URINALYSIS DIPSTICK
Blood, UA: NEGATIVE
GLUCOSE UA: NEGATIVE
Ketones, UA: NEGATIVE
NITRITE UA: NEGATIVE
Protein, UA: NEGATIVE

## 2017-01-18 MED ORDER — PRENATAL 27-0.8 MG PO TABS: 1.0000 | ORAL_TABLET | Freq: Every day | ORAL | 11 refills | Status: DC

## 2017-01-18 NOTE — Progress Notes (Signed)
Subjective:    Maureen Key is a [id number] [redacted]w[redacted]d being seen today for her first obstetrical visit.  Her obstetrical history is significant for PTB @ 32 weeks, CS for twins.  Records requested, pt poor historian. Mono/Mono, but states was intubated in a coma for 2 days. Had a SVD at term in 2010. Wants TOLAC.  CO vaginal itch/irritation for a few weeks.  Has been w/FOB for 10 years, but he was caught cheating a few years ago and he got gonorrhea.  Has felt depressed for 3 years, now wants to get treated.  Agreed to therapy and possibly meds.   Patient reports feeling like she has BV.   Vitals:   01/18/17 1432  BP: 94/68  Pulse: 97  Weight: 106 lb (48.1 kg)    HISTORY: OB History  Gravida Para Term Preterm AB Living  3 2 1 1   3   SAB TAB Ectopic Multiple Live Births        1 3    # Outcome Date GA Lbr Len/2nd Weight Sex Delivery Anes PTL Lv  3 Current           2A Preterm 09/29/15 [redacted]w[redacted]d  2 lb 14 oz (1.304 kg) F CS-LTranv Spinal N LIV     Complications: Short cervix affecting pregnancy  2B Preterm 09/29/15 [redacted]w[redacted]d  2 lb 6 oz (1.077 kg) F CS-LTranv Spinal N LIV  1 Term 03/13/09 [redacted]w[redacted]d  6 lb 7 oz (2.92 kg) M Vag-Spont EPI N LIV     Past Medical History:  Diagnosis Date  . Dysmenorrhea   . Sickle cell trait [facility])    Past Surgical History:  Procedure Laterality Date  . CERVICAL CONE BIOPSY    . CESAREAN SECTION     Family History  Problem Relation Age of Onset  . Hypertension Mother   . HIV/AIDS Father   . Cancer Father        lung  . Diabetes Sister   . Diabetes Maternal Aunt   . Diabetes Maternal Uncle   . Cancer Paternal Aunt   . Cancer Paternal Uncle   . Diabetes Maternal Grandmother   . Alzheimer's disease Paternal Grandmother      Exam       Pelvic Exam:    Perineum: Normal Perineum   Vulva: normal   Vagina:  normal mucosa, yellow frothy dc;  Wet prep + trich, WBC, no clues.  no palpable nodules   Uterus Normal, Gravid, FH:       Cervix: normal   Adnexa: Not palpable   Urinary:  urethral meatus normal    System:     Skin: normal coloration and turgor, no rashes    Neurologic: oriented, normal, normal mood   Extremities: normal strength, tone, and muscle mass   HEENT PERRLA   Mouth/Teeth mucous membranes moist, normal dentition   Neck supple and no masses   Cardiovascular: regular rate and rhythm   Respiratory:  appears well, vitals normal, no respiratory distress, acyanotic   Abdomen: soft, non-tender;  FHR: 150          Assessment:    Pregnancy: [id number] Patient Active Problem List   Diagnosis Date Noted  . Supervision of normal pregnancy 01/18/2017  . History of preterm delivery 01/18/2017        Plan:      Continue prenatal vitamins  Problem list reviewed and updated  Reviewed recommended weight gain based on pre-gravid BMI  Encouraged well-balanced diet Flagyl for pt and  partner (doesn't have insurance yet, so 2gm Flagyl each ordered rather than 7 day tx per request) Referral to [facility] sent Genetic Screening discussed CfDNA: results reviewed.  Ultrasound discussed; fetal survey: ordered.  Return for asap for anatomy scan only; 4 weeks for [facility]  Request records from [place].  Maureen Key 01/18/2017

## 2017-01-18 NOTE — Patient Instructions (Signed)
Safe Medications in Pregnancy   Acne: Benzoyl Peroxide Salicylic Acid  Backache/Headache: Tylenol: 2 regular strength every 4 hours OR              2 Extra strength every 6 hours  Colds/Coughs/Allergies: Benadryl (alcohol free) 25 mg every 6 hours as needed Breath right strips Claritin Cepacol throat lozenges Chloraseptic throat spray Cold-Eeze- up to three times per day Cough drops, alcohol free Flonase (by prescription only) Guaifenesin Mucinex Robitussin DM (plain only, alcohol free) Saline nasal spray/drops Sudafed (pseudoephedrine) & Actifed ** use only after [redacted] weeks gestation and if you do not have high blood pressure Tylenol Vicks Vaporub Zinc lozenges Zyrtec   Constipation: Colace Ducolax suppositories Fleet enema Glycerin suppositories Metamucil Milk of magnesia Miralax Senokot Smooth move tea  Diarrhea: Kaopectate Imodium A-D  *NO pepto Bismol  Hemorrhoids: Anusol Anusol HC Preparation H Tucks  Indigestion: Tums Maalox Mylanta Zantac  Pepcid  Insomnia: Benadryl (alcohol free) 25mg every 6 hours as needed Tylenol PM Unisom, no Gelcaps  Leg Cramps: Tums MagGel  Nausea/Vomiting:  Bonine Dramamine Emetrol Ginger extract Sea bands Meclizine  Nausea medication to take during pregnancy:  Unisom (doxylamine succinate 25 mg tablets) Take one tablet daily at bedtime. If symptoms are not adequately controlled, the dose can be increased to a maximum recommended dose of two tablets daily (1/2 tablet in the morning, 1/2 tablet mid-afternoon and one at bedtime). Vitamin B6 100mg tablets. Take one tablet twice a day (up to 200 mg per day).  Skin Rashes: Aveeno products Benadryl cream or 25mg every 6 hours as needed Calamine Lotion 1% cortisone cream  Yeast infection: Gyne-lotrimin 7 Monistat 7   **If taking multiple medications, please check labels to avoid duplicating the same active ingredients **take medication as directed on  the label ** Do not exceed 4000 mg of tylenol in 24 hours **Do not take medications that contain aspirin or ibuprofen     Second Trimester of Pregnancy The second trimester is from week 14 through week 27 (months 4 through 6). The second trimester is often a time when you feel your best. Your body has adjusted to being pregnant, and you begin to feel better physically. Usually, morning sickness has lessened or quit completely, you may have more energy, and you may have an increase in appetite. The second trimester is also a time when the fetus is growing rapidly. At the end of the sixth month, the fetus is about 9 inches long and weighs about 1 pounds. You will likely begin to feel the baby move (quickening) between 16 and 20 weeks of pregnancy. Body changes during your second trimester Your body continues to go through many changes during your second trimester. The changes vary from woman to woman.  Your weight will continue to increase. You will notice your lower abdomen bulging out.  You may begin to get stretch marks on your hips, abdomen, and breasts.  You may develop headaches that can be relieved by medicines. The medicines should be approved by your health care provider.  You may urinate more often because the fetus is pressing on your bladder.  You may develop or continue to have heartburn as a result of your pregnancy.  You may develop constipation because certain hormones are causing the muscles that push waste through your intestines to slow down.  You may develop hemorrhoids or swollen, bulging veins (varicose veins).  You may have back pain. This is caused by: ? Weight gain. ? Pregnancy hormones that are   relaxing the joints in your pelvis. ? A shift in weight and the muscles that support your balance.  Your breasts will continue to grow and they will continue to become tender.  Your gums may bleed and may be sensitive to brushing and flossing.  Dark spots or blotches  (chloasma, mask of pregnancy) may develop on your face. This will likely fade after the baby is born.  A dark line from your belly button to the pubic area (linea nigra) may appear. This will likely fade after the baby is born.  You may have changes in your hair. These can include thickening of your hair, rapid growth, and changes in texture. Some women also have hair loss during or after pregnancy, or hair that feels dry or thin. Your hair will most likely return to normal after your baby is born.  What to expect at prenatal visits During a routine prenatal visit:  You will be weighed to make sure you and the fetus are growing normally.  Your blood pressure will be taken.  Your abdomen will be measured to track your baby's growth.  The fetal heartbeat will be listened to.  Any test results from the previous visit will be discussed.  Your health care provider may ask you:  How you are feeling.  If you are feeling the baby move.  If you have had any abnormal symptoms, such as leaking fluid, bleeding, severe headaches, or abdominal cramping.  If you are using any tobacco products, including cigarettes, chewing tobacco, and electronic cigarettes.  If you have any questions.  Other tests that may be performed during your second trimester include:  Blood tests that check for: ? Low iron levels (anemia). ? High blood sugar that affects pregnant women (gestational diabetes) between 24 and 28 weeks. ? Rh antibodies. This is to check for a protein on red blood cells (Rh factor).  Urine tests to check for infections, diabetes, or protein in the urine.  An ultrasound to confirm the proper growth and development of the baby.  An amniocentesis to check for possible genetic problems.  Fetal screens for spina bifida and Down syndrome.  HIV (human immunodeficiency virus) testing. Routine prenatal testing includes screening for HIV, unless you choose not to have this test.  Follow  these instructions at home: Medicines  Follow your health care provider's instructions regarding medicine use. Specific medicines may be either safe or unsafe to take during pregnancy.  Take a prenatal vitamin that contains at least 600 micrograms (mcg) of folic acid.  If you develop constipation, try taking a stool softener if your health care provider approves. Eating and drinking  Eat a balanced diet that includes fresh fruits and vegetables, whole grains, good sources of protein such as meat, eggs, or tofu, and low-fat dairy. Your health care provider will help you determine the amount of weight gain that is right for you.  Avoid raw meat and uncooked cheese. These carry germs that can cause birth defects in the baby.  If you have low calcium intake from food, talk to your health care provider about whether you should take a daily calcium supplement.  Limit foods that are high in fat and processed sugars, such as fried and sweet foods.  To prevent constipation: ? Drink enough fluid to keep your urine clear or pale yellow. ? Eat foods that are high in fiber, such as fresh fruits and vegetables, whole grains, and beans. Activity  Exercise only as directed by your health care provider.   Most women can continue their usual exercise routine during pregnancy. Try to exercise for 30 minutes at least 5 days a week. Stop exercising if you experience uterine contractions.  Avoid heavy lifting, wear low heel shoes, and practice good posture.  A sexual relationship may be continued unless your health care provider directs you otherwise. Relieving pain and discomfort  Wear a good support bra to prevent discomfort from breast tenderness.  Take warm sitz baths to soothe any pain or discomfort caused by hemorrhoids. Use hemorrhoid cream if your health care provider approves.  Rest with your legs elevated if you have leg cramps or low back pain.  If you develop varicose veins, wear support  hose. Elevate your feet for 15 minutes, 3-4 times a day. Limit salt in your diet. Prenatal Care  Write down your questions. Take them to your prenatal visits.  Keep all your prenatal visits as told by your health care provider. This is important. Safety  Wear your seat belt at all times when driving.  Make a list of emergency phone numbers, including numbers for family, friends, the hospital, and police and fire departments. General instructions  Ask your health care provider for a referral to a local prenatal education class. Begin classes no later than the beginning of month 6 of your pregnancy.  Ask for help if you have counseling or nutritional needs during pregnancy. Your health care provider can offer advice or refer you to specialists for help with various needs.  Do not use hot tubs, steam rooms, or saunas.  Do not douche or use tampons or scented sanitary pads.  Do not cross your legs for long periods of time.  Avoid cat litter boxes and soil used by cats. These carry germs that can cause birth defects in the baby and possibly loss of the fetus by miscarriage or stillbirth.  Avoid all smoking, herbs, alcohol, and unprescribed drugs. Chemicals in these products can affect the formation and growth of the baby.  Do not use any products that contain nicotine or tobacco, such as cigarettes and e-cigarettes. If you need help quitting, ask your health care provider.  Visit your dentist if you have not gone yet during your pregnancy. Use a soft toothbrush to brush your teeth and be gentle when you floss. Contact a health care provider if:  You have dizziness.  You have mild pelvic cramps, pelvic pressure, or nagging pain in the abdominal area.  You have persistent nausea, vomiting, or diarrhea.  You have a bad smelling vaginal discharge.  You have pain when you urinate. Get help right away if:  You have a fever.  You are leaking fluid from your vagina.  You have  spotting or bleeding from your vagina.  You have severe abdominal cramping or pain.  You have rapid weight gain or weight loss.  You have shortness of breath with chest pain.  You notice sudden or extreme swelling of your face, hands, ankles, feet, or legs.  You have not felt your baby move in over an hour.  You have severe headaches that do not go away when you take medicine.  You have vision changes. Summary  The second trimester is from week 14 through week 27 (months 4 through 6). It is also a time when the fetus is growing rapidly.  Your body goes through many changes during pregnancy. The changes vary from woman to woman.  Avoid all smoking, herbs, alcohol, and unprescribed drugs. These chemicals affect the formation and growth   your baby.  Do not use any tobacco products, such as cigarettes, chewing tobacco, and e-cigarettes. If you need help quitting, ask your health care provider.  Contact your health care provider if you have any questions. Keep all prenatal visits as told by your health care provider. This is important. This information is not intended to replace advice given to you by your health care provider. Make sure you discuss any questions you have with your health care provider. Document Released: 06/21/2001 Document Revised: 12/03/2015 Document Reviewed: 08/28/2012 Elsevier Interactive Patient Education  2017 [employer]  

## 2017-01-18 NOTE — Progress Notes (Signed)
  Subjective:     error

## 2017-01-23 ENCOUNTER — Telehealth: Payer: Self-pay | Admitting: *Deleted

## 2017-01-24 ENCOUNTER — Ambulatory Visit (INDEPENDENT_AMBULATORY_CARE_PROVIDER_SITE_OTHER): Payer: Medicaid Other

## 2017-01-24 DIAGNOSIS — Z363 Encounter for antenatal screening for malformations: Secondary | ICD-10-CM | POA: Diagnosis not present

## 2017-01-24 DIAGNOSIS — Z3402 Encounter for supervision of normal first pregnancy, second trimester: Secondary | ICD-10-CM

## 2017-01-24 DIAGNOSIS — Z8751 Personal history of pre-term labor: Secondary | ICD-10-CM

## 2017-01-24 NOTE — Progress Notes (Signed)
[place] 23+4 wks,frank breech,cx 3.1 cm,ant pl gr 0,normal ovaries bilat,svp of fluid 5.3 cm,fhr 144 bpm,EFW 637 g,anatomy complete,no obvious abnormalities seen

## 2017-01-25 ENCOUNTER — Encounter: Payer: Self-pay | Admitting: Advanced Practice Midwife

## 2017-02-06 ENCOUNTER — Encounter: Payer: Self-pay | Admitting: Advanced Practice Midwife

## 2017-02-16 ENCOUNTER — Encounter: Payer: Self-pay | Admitting: Obstetrics & Gynecology

## 2017-02-16 ENCOUNTER — Encounter: Payer: Self-pay | Admitting: Women's Health

## 2017-02-16 ENCOUNTER — Ambulatory Visit (INDEPENDENT_AMBULATORY_CARE_PROVIDER_SITE_OTHER): Payer: Medicaid Other | Admitting: Obstetrics & Gynecology

## 2017-02-16 VITALS — BP 92/50 | HR 87 | Wt 115.5 lb

## 2017-02-16 DIAGNOSIS — B373 Candidiasis of vulva and vagina: Secondary | ICD-10-CM

## 2017-02-16 DIAGNOSIS — Z3482 Encounter for supervision of other normal pregnancy, second trimester: Secondary | ICD-10-CM

## 2017-02-16 DIAGNOSIS — Z1389 Encounter for screening for other disorder: Secondary | ICD-10-CM

## 2017-02-16 DIAGNOSIS — Z331 Pregnant state, incidental: Secondary | ICD-10-CM

## 2017-02-16 DIAGNOSIS — Z3A26 26 weeks gestation of pregnancy: Secondary | ICD-10-CM

## 2017-02-16 DIAGNOSIS — B3731 Acute candidiasis of vulva and vagina: Secondary | ICD-10-CM

## 2017-02-16 LAB — POCT URINALYSIS DIPSTICK
GLUCOSE UA: NEGATIVE
KETONES UA: NEGATIVE
Nitrite, UA: NEGATIVE
Protein, UA: NEGATIVE
RBC UA: NEGATIVE

## 2017-02-16 MED ORDER — TERCONAZOLE 0.4 % VA CREA: 1.0000 | TOPICAL_CREAM | Freq: Every day | VAGINAL | 0 refills | Status: DC

## 2017-02-16 NOTE — Progress Notes (Signed)
[id number] [redacted]w[redacted]d Estimated Date of Delivery: 05/19/17  Blood pressure (!) 92/50, pulse 87, weight 115 lb 8 oz (52.4 kg), last menstrual period 08/05/2016.   BP weight and urine results all reviewed and noted.  Please refer to the obstetrical flow sheet for the fundal height and fetal heart rate documentation:  Patient reports good fetal movement, denies any bleeding and no rupture of membranes symptoms or regular contractions. Patient is without complaints. All questions were answered.  Orders Placed This Encounter  Procedures  . POCT Urinalysis Dipstick    Plan:  Continued routine obstetrical care,  No trichomonas + yeast infection  Meds ordered this encounter  Medications  . terconazole (TERAZOL 7) 0.4 % vaginal cream    Sig: Place 1 applicator vaginally at bedtime.    Dispense:  45 g    Refill:  0    Return in about 2 weeks (around 03/02/2017) for PN2, LROB.

## 2017-02-22 ENCOUNTER — Encounter: Payer: Self-pay | Admitting: Obstetrics & Gynecology

## 2017-02-22 ENCOUNTER — Encounter: Payer: Self-pay | Admitting: Advanced Practice Midwife

## 2017-03-02 ENCOUNTER — Encounter: Payer: Self-pay | Admitting: Advanced Practice Midwife

## 2017-03-02 ENCOUNTER — Other Ambulatory Visit: Payer: Medicaid Other

## 2017-03-02 ENCOUNTER — Ambulatory Visit (INDEPENDENT_AMBULATORY_CARE_PROVIDER_SITE_OTHER): Payer: Medicaid Other | Admitting: Advanced Practice Midwife

## 2017-03-02 VITALS — BP 90/50 | HR 100 | Wt 116.0 lb

## 2017-03-02 DIAGNOSIS — Z3A28 28 weeks gestation of pregnancy: Secondary | ICD-10-CM

## 2017-03-02 DIAGNOSIS — Z1389 Encounter for screening for other disorder: Secondary | ICD-10-CM

## 2017-03-02 DIAGNOSIS — Z3483 Encounter for supervision of other normal pregnancy, third trimester: Secondary | ICD-10-CM

## 2017-03-02 DIAGNOSIS — O368130 Decreased fetal movements, third trimester, not applicable or unspecified: Secondary | ICD-10-CM | POA: Diagnosis not present

## 2017-03-02 DIAGNOSIS — Z331 Pregnant state, incidental: Secondary | ICD-10-CM

## 2017-03-02 DIAGNOSIS — Z3482 Encounter for supervision of other normal pregnancy, second trimester: Secondary | ICD-10-CM

## 2017-03-02 DIAGNOSIS — Z131 Encounter for screening for diabetes mellitus: Secondary | ICD-10-CM

## 2017-03-02 LAB — POCT URINALYSIS DIPSTICK
GLUCOSE UA: NEGATIVE
KETONES UA: NEGATIVE
Leukocytes, UA: NEGATIVE
Nitrite, UA: NEGATIVE
RBC UA: NEGATIVE

## 2017-03-02 NOTE — Progress Notes (Signed)
[id number] [redacted]w[redacted]d Estimated Date of Delivery: 05/19/17  Blood pressure (!) 90/50, pulse 100, weight 116 lb (52.6 kg), last menstrual period 08/05/2016.   BP weight and urine results all reviewed and noted.  Please refer to the obstetrical flow sheet for the fundal height and fetal heart rate documentation:  Patient reports decreased fetal movement last night, denies any bleeding and no rupture of membranes symptoms or regular contractions. Patient is without complaints. All questions were answered.   NSt reassuring, active fetus now.  Still don't have records from last delivery.  Request sent again   Orders Placed This Encounter  Procedures  . POCT urinalysis dipstick    Plan:  Continued routine obstetrical care, PN2 today  Return in about 3 weeks (around 03/23/2017) for LROB.

## 2017-03-02 NOTE — Patient Instructions (Signed)

## 2017-03-03 LAB — ANTIBODY SCREEN: ANTIBODY SCREEN: NEGATIVE

## 2017-03-03 LAB — CBC
HEMATOCRIT: 31.6 % — AB (ref 34.0–46.6)
HEMOGLOBIN: 10.9 g/dL — AB (ref 11.1–15.9)
MCH: 34.4 pg — ABNORMAL HIGH (ref 26.6–33.0)
MCHC: 34.5 g/dL (ref 31.5–35.7)
MCV: 100 fL — AB (ref 79–97)
Platelets: 168 10*3/uL (ref 150–379)
RBC: 3.17 x10E6/uL — AB (ref 3.77–5.28)
RDW: 12.5 % (ref 12.3–15.4)
WBC: 6.6 10*3/uL (ref 3.4–10.8)

## 2017-03-03 LAB — RPR: RPR: NONREACTIVE

## 2017-03-03 LAB — GLUCOSE TOLERANCE, 2 HOURS W/ 1HR
GLUCOSE, 1 HOUR: 108 mg/dL (ref 65–179)
GLUCOSE, 2 HOUR: 59 mg/dL — AB (ref 65–152)
Glucose, Fasting: 79 mg/dL (ref 65–91)

## 2017-03-03 LAB — HIV ANTIBODY (ROUTINE TESTING W REFLEX): HIV Screen 4th Generation wRfx: NONREACTIVE

## 2017-03-10 ENCOUNTER — Encounter: Payer: Self-pay | Admitting: Obstetrics & Gynecology

## 2017-03-14 ENCOUNTER — Telehealth: Payer: Self-pay | Admitting: *Deleted

## 2017-03-14 ENCOUNTER — Encounter: Payer: Self-pay | Admitting: Advanced Practice Midwife

## 2017-03-14 NOTE — Telephone Encounter (Signed)
Called pt trying to get more info regarding pt advice email to our office. Left message @ 5:07 pm. Advised if she needs to go to ER for eval, should go. [place]

## 2017-03-16 NOTE — Telephone Encounter (Signed)
Spoke with pt. Pt is not having any redness or warm to touch in leg. Pt's hand is still bothering her. Advised to keep her appt for the 13th and mention it at her appt. Call sooner if problems. Pt voiced understanding. [place]

## 2017-03-23 ENCOUNTER — Encounter: Payer: Self-pay | Admitting: Obstetrics & Gynecology

## 2017-03-23 ENCOUNTER — Ambulatory Visit (INDEPENDENT_AMBULATORY_CARE_PROVIDER_SITE_OTHER): Payer: Medicaid Other | Admitting: Obstetrics & Gynecology

## 2017-03-23 VITALS — BP 90/52 | HR 97 | Wt 126.0 lb

## 2017-03-23 DIAGNOSIS — Z23 Encounter for immunization: Secondary | ICD-10-CM

## 2017-03-23 DIAGNOSIS — Z3483 Encounter for supervision of other normal pregnancy, third trimester: Secondary | ICD-10-CM

## 2017-03-23 DIAGNOSIS — Z331 Pregnant state, incidental: Secondary | ICD-10-CM

## 2017-03-23 DIAGNOSIS — Z3A31 31 weeks gestation of pregnancy: Secondary | ICD-10-CM

## 2017-03-23 DIAGNOSIS — Z1389 Encounter for screening for other disorder: Secondary | ICD-10-CM

## 2017-03-23 LAB — POCT URINALYSIS DIPSTICK
Blood, UA: NEGATIVE
Glucose, UA: NEGATIVE
Ketones, UA: NEGATIVE
LEUKOCYTES UA: NEGATIVE
NITRITE UA: NEGATIVE
PROTEIN UA: NEGATIVE

## 2017-03-23 NOTE — Progress Notes (Signed)
[id number] [redacted]w[redacted]d Estimated Date of Delivery: 05/19/17  Blood pressure (!) 90/52, pulse 97, weight 126 lb (57.2 kg), last menstrual period 08/05/2016.   BP weight and urine results all reviewed and noted.  Please refer to the obstetrical flow sheet for the fundal height and fetal heart rate documentation:  Patient reports good fetal movement, denies any bleeding and no rupture of membranes symptoms or regular contractions. Patient is without complaints. All questions were answered.  Orders Placed This Encounter  Procedures  . Flu Vaccine QUAD 36+ mos IM  . POCT urinalysis dipstick    Plan:  Continued routine obstetrical care, previous C section desires TOLAC  Return in about 2 weeks (around 04/06/2017) for [place].

## 2017-04-06 ENCOUNTER — Encounter: Payer: Self-pay | Admitting: Obstetrics and Gynecology

## 2017-04-06 ENCOUNTER — Ambulatory Visit (INDEPENDENT_AMBULATORY_CARE_PROVIDER_SITE_OTHER): Payer: Medicaid Other | Admitting: Obstetrics and Gynecology

## 2017-04-06 ENCOUNTER — Encounter: Payer: Self-pay | Admitting: Advanced Practice Midwife

## 2017-04-06 VITALS — BP 100/60 | HR 84 | Wt 129.0 lb

## 2017-04-06 DIAGNOSIS — Z3482 Encounter for supervision of other normal pregnancy, second trimester: Secondary | ICD-10-CM

## 2017-04-06 DIAGNOSIS — Z331 Pregnant state, incidental: Secondary | ICD-10-CM

## 2017-04-06 DIAGNOSIS — Z3A33 33 weeks gestation of pregnancy: Secondary | ICD-10-CM

## 2017-04-06 DIAGNOSIS — Z1389 Encounter for screening for other disorder: Secondary | ICD-10-CM

## 2017-04-06 LAB — POCT URINALYSIS DIPSTICK
Blood, UA: NEGATIVE
Glucose, UA: NEGATIVE
KETONES UA: NEGATIVE
LEUKOCYTES UA: NEGATIVE
Nitrite, UA: NEGATIVE
PROTEIN UA: NEGATIVE

## 2017-04-06 NOTE — Progress Notes (Signed)
Patient ID: Maureen Key, female   DOB: [birth year], 36 y.o.   MRN: [id number]  Estimated Date of Delivery: 05/19/17 LROB [redacted]w[redacted]d  Chief Complaint  Patient presents with  . Routine Prenatal Visit  ____  Patient complaints: Pt is left handed and reports pain to her hand. She thinks it is carpel tunnel. Patient reports good fetal movement, denies any bleeding, rupture of membranes,or regular contractions.  Blood pressure 100/60, pulse 84, weight 129 lb (58.5 kg), last menstrual period 08/05/2016.   Urine results:notable for negative refer to the ob flow sheet for FH and FHR, ,                          Physical Examination: General appearance - alert, well appearing, and in no distress, oriented to person, place, and time and normal appearing weight Abdomen - FH 33 cm,                   -FHR 138                                            Questions were answered. Assessment: LROB [id number] @ [redacted]w[redacted]d   Plan:  Continued routine obstetrical care,  F/u in 2 weeks for LROB Tubal ligation after birth  By signing my name below, I, Margit Banda, attest that this documentation has been prepared under the direction and in the presence of Jonnie Kind, MD. Electronically Signed: Margit Banda, Medical Scribe. 04/06/17. 11:13 AM.  I personally performed the services described in this documentation, which was SCRIBED in my presence. The recorded information has been reviewed and considered accurate. It has been edited as necessary during review. Jonnie Kind, MD

## 2017-04-18 ENCOUNTER — Inpatient Hospital Stay (HOSPITAL_COMMUNITY)
Admission: AD | Admit: 2017-04-18 | Discharge: 2017-04-18 | Disposition: A | Discharge status: Home / Self Care | Payer: Medicaid Other | Source: Ambulatory Visit | Attending: Obstetrics and Gynecology | Admitting: Obstetrics and Gynecology

## 2017-04-18 ENCOUNTER — Encounter (HOSPITAL_COMMUNITY): Payer: Self-pay | Admitting: *Deleted

## 2017-04-18 DIAGNOSIS — Z3A35 35 weeks gestation of pregnancy: Secondary | ICD-10-CM | POA: Insufficient documentation

## 2017-04-18 DIAGNOSIS — F17210 Nicotine dependence, cigarettes, uncomplicated: Secondary | ICD-10-CM | POA: Insufficient documentation

## 2017-04-18 DIAGNOSIS — O4703 False labor before 37 completed weeks of gestation, third trimester: Secondary | ICD-10-CM

## 2017-04-18 DIAGNOSIS — O99333 Smoking (tobacco) complicating pregnancy, third trimester: Secondary | ICD-10-CM | POA: Diagnosis not present

## 2017-04-18 DIAGNOSIS — O479 False labor, unspecified: Secondary | ICD-10-CM

## 2017-04-18 DIAGNOSIS — O4700 False labor before 37 completed weeks of gestation, unspecified trimester: Secondary | ICD-10-CM

## 2017-04-18 DIAGNOSIS — M549 Dorsalgia, unspecified: Secondary | ICD-10-CM | POA: Diagnosis present

## 2017-04-18 MED ORDER — BETAMETHASONE SOD PHOS & ACET 6 (3-3) MG/ML IJ SUSP
12.0000 mg | Freq: Once | INTRAMUSCULAR | Status: AC
  Administered 2017-04-18: 12 mg via INTRAMUSCULAR
  Filled 2017-04-18: qty 2

## 2017-04-18 NOTE — MAU Note (Signed)
+  contractions +lower back pain +increase in vaginal pressure Rating pain at 10/10  Denies VB or LOF  +FM

## 2017-04-18 NOTE — MAU Provider Note (Signed)
Chief Complaint:  Contractions and Back Pain  None   HPI: Maureen Key is a 36 y.o. [id number] at [redacted]w[redacted]d who presents to MAU reporting contractions and back pain. Patient states that contractions started occurring yesterday evening. They were spaced out and irregular at fist but now coming more often. She has also noticed some increased pelvic pressure with some increased mucous. Rating contraction pain 10/10. Patient is anxious because she has a history of preterm complicated delivery with her twins.   Denies leakage of fluid, vaginal discharge, or vaginal bleeding. Good fetal movement.   Pregnancy Course:  Gets prenatal care at [facility] H/o cervical biopsy for abnormal pap  Past Medical History: Past Medical History:  Diagnosis Date  . Dysmenorrhea   . Sickle cell trait ([place])   . Trichimoniasis     Past obstetric history: OB History  Gravida Para Term Preterm AB Living  3 2 1 1   3   SAB TAB Ectopic Multiple Live Births        1 3    # Outcome Date GA Lbr Len/2nd Weight Sex Delivery Anes PTL Lv  3 Current           2A Preterm 09/29/15 [redacted]w[redacted]d  1.304 kg (2 lb 14 oz) F CS-LTranv Spinal N LIV     Complications: Short cervix affecting pregnancy  2B Preterm 09/29/15 [redacted]w[redacted]d  1.077 kg (2 lb 6 oz) F CS-LTranv Spinal N LIV  1 Term 03/13/09 [redacted]w[redacted]d  2.92 kg (6 lb 7 oz) M Vag-Spont EPI N LIV    Obstetric Comments  Mono/mono twins, scheduled    Past Surgical History: Past Surgical History:  Procedure Laterality Date  . CERVICAL CONE BIOPSY    . CESAREAN SECTION       Family History: Family History  Problem Relation Age of Onset  . Hypertension Mother   . HIV/AIDS Father   . Cancer Father        lung  . Diabetes Sister   . Diabetes Maternal Aunt   . Diabetes Maternal Uncle   . Cancer Paternal Aunt   . Cancer Paternal Uncle   . Diabetes Maternal Grandmother   . Alzheimer's disease Paternal Grandmother     Social History: Social History  Substance Use Topics  . Smoking  status: Current Every Day Smoker    Packs/day: 0.25  . Smokeless tobacco: Never Used     Comment: 2 a day  . Alcohol use No    Allergies: No Known Allergies  Meds:  Prescriptions Prior to Admission  Medication Sig Dispense Refill Last Dose  . Omega-3 Fatty Acids (FISH OIL) 1000 MG CAPS Take by mouth daily.    Taking  . Prenatal Vit-Fe Fumarate-FA (MULTIVITAMIN-PRENATAL) 27-0.8 MG TABS tablet Take 1 tablet by mouth daily at 12 noon. 44 each 11 Taking    I have reviewed patient's Past Medical Hx, Surgical Hx, Family Hx, Social Hx, medications and allergies.   ROS:  All systems reviewed and are negative for acute change except as noted in the HPI.   Physical Exam  Patient Vitals for the past 24 hrs:  BP Temp Temp src Pulse Resp SpO2 Weight  04/18/17 1624 101/72 98.2 F (36.8 C) Oral 96 17 99 % 61.7 kg (136 lb 0.6 oz)   Constitutional: Well-developed, well-nourished female in no acute distress.  Cardiovascular: normal rate and rhythm, pulses intact Respiratory: normal rate and effort.  GI: Abd soft, non-tender, gravid appropriate for gestational age.  MS: Extremities nontender, no edema,  normal ROM Neurologic: Alert and oriented x 4.  Psych: normal mood and affect  Dilation: 1 Effacement (%): 70 Cervical Position: Anterior Station: Ballotable Exam by:: Darcel Bayley, MD   Labs: No results found for this or any previous visit (from the past 24 hour(s)).  Imaging:  No results found.  MAU Course: Vitals and nursing notes reviewed Cervical exam as above Observed patient for 1hr without change in cervix Patient PO hydrated with ctx pattern going from q4-81min to UI Treatments given in MAU: BMZ x1 given with repeat in 24hrs  I personally reviewed the patient's NST today, found to be REACTIVE. 130 bpm, mod var, +accels, no decels. CTX: UI.   MDM: Plan of care reviewed with patient, including labs and tests ordered and medical treatment.   Assessment: 1. Preterm  contractions   2. Braxton Hick's contraction     Plan: Discharge home in stable condition Return for repeat BMZ in 24hrs Preterm labor precautions and fetal kick counts reviewed Handout given Follow-up with OB provider on Thursday at regular appointment   Luiz Blare, [place] for [facility], [facility] 04/18/2017 4:41 PM

## 2017-04-18 NOTE — MAU Note (Signed)
Urine  In the lab

## 2017-04-18 NOTE — Discharge Instructions (Signed)
Return to MAU in 24hrs for second dose of steroids Keep OB appointment in Thursday  Preterm Labor and Birth Information Pregnancy normally lasts 39-41 weeks. Preterm labor is when labor starts early. It starts before you have been pregnant for 37 whole weeks. What are the risk factors for preterm labor? Preterm labor is more likely to occur in women who:  Have an infection while pregnant.  Have a cervix that is short.  Have gone into preterm labor before.  Have had surgery on their cervix.  Are younger than age 25.  Are older than age 62.  Are African American.  Are pregnant with two or more babies.  Take street drugs while pregnant.  Smoke while pregnant.  Do not gain enough weight while pregnant.  Got pregnant right after another pregnancy.  What are the symptoms of preterm labor? Symptoms of preterm labor include:  Cramps. The cramps may feel like the cramps some women get during their period. The cramps may happen with watery poop (diarrhea).  Pain in the belly (abdomen).  Pain in the lower back.  Regular contractions or tightening. It may feel like your belly is getting tighter.  Pressure in the lower belly that seems to get stronger.  More fluid (discharge) leaking from the vagina. The fluid may be watery or bloody.  Water breaking.  Why is it important to notice signs of preterm labor? Babies who are born early may not be fully developed. They have a higher chance for:  Long-term heart problems.  Long-term lung problems.  Trouble controlling body systems, like breathing.  Bleeding in the brain.  A condition called cerebral palsy.  Learning difficulties.  Death.  These risks are highest for babies who are born before 83 weeks of pregnancy. How is preterm labor treated? Treatment depends on:  How long you were pregnant.  Your condition.  The health of your baby.  Treatment may involve:  Having a stitch (suture) placed in your cervix.  When you give birth, your cervix opens so the baby can come out. The stitch keeps the cervix from opening too soon.  Staying at the hospital.  Taking or getting medicines, such as: ? Hormone medicines. ? Medicines to stop contractions. ? Medicines to help the babys lungs develop. ? Medicines to prevent your baby from having cerebral palsy.  What should I do if I am in preterm labor? If you think you are going into labor too soon, call your doctor right away. How can I prevent preterm labor?  Do not use any tobacco products. ? Examples of these are cigarettes, chewing tobacco, and e-cigarettes. ? If you need help quitting, ask your doctor.  Do not use street drugs.  Do not use any medicines unless you ask your doctor if they are safe for you.  Talk with your doctor before taking any herbal supplements.  Make sure you gain enough weight.  Watch for infection. If you think you might have an infection, get it checked right away.  If you have gone into preterm labor before, tell your doctor. This information is not intended to replace advice given to you by your health care provider. Make sure you discuss any questions you have with your health care provider. Document Released: 09/23/2008 Document Revised: 12/08/2015 Document Reviewed: 11/18/2015 Elsevier Interactive Patient Education  2018 [employer].

## 2017-04-19 ENCOUNTER — Inpatient Hospital Stay (HOSPITAL_COMMUNITY)
Admission: AD | Admit: 2017-04-19 | Discharge: 2017-04-19 | Disposition: A | Discharge status: Home / Self Care | Payer: Medicaid Other | Source: Ambulatory Visit | Attending: Obstetrics and Gynecology | Admitting: Obstetrics and Gynecology

## 2017-04-19 DIAGNOSIS — Z3A35 35 weeks gestation of pregnancy: Secondary | ICD-10-CM | POA: Diagnosis not present

## 2017-04-19 DIAGNOSIS — O4703 False labor before 37 completed weeks of gestation, third trimester: Secondary | ICD-10-CM | POA: Diagnosis present

## 2017-04-19 MED ORDER — BETAMETHASONE SOD PHOS & ACET 6 (3-3) MG/ML IJ SUSP
12.0000 mg | Freq: Once | INTRAMUSCULAR | Status: AC
  Administered 2017-04-19: 12 mg via INTRAMUSCULAR
  Filled 2017-04-19: qty 2

## 2017-04-19 NOTE — MAU Note (Signed)
Patient here for 2nd [place]. Denies any complaints at this time

## 2017-04-20 ENCOUNTER — Ambulatory Visit (INDEPENDENT_AMBULATORY_CARE_PROVIDER_SITE_OTHER): Payer: Medicaid Other | Admitting: Advanced Practice Midwife

## 2017-04-20 ENCOUNTER — Encounter: Payer: Self-pay | Admitting: Advanced Practice Midwife

## 2017-04-20 VITALS — BP 132/82 | HR 77 | Wt 139.0 lb

## 2017-04-20 DIAGNOSIS — O26843 Uterine size-date discrepancy, third trimester: Secondary | ICD-10-CM

## 2017-04-20 DIAGNOSIS — Z331 Pregnant state, incidental: Secondary | ICD-10-CM

## 2017-04-20 DIAGNOSIS — Z3A35 35 weeks gestation of pregnancy: Secondary | ICD-10-CM

## 2017-04-20 DIAGNOSIS — Z1389 Encounter for screening for other disorder: Secondary | ICD-10-CM

## 2017-04-20 DIAGNOSIS — O09893 Supervision of other high risk pregnancies, third trimester: Secondary | ICD-10-CM

## 2017-04-20 DIAGNOSIS — Z3483 Encounter for supervision of other normal pregnancy, third trimester: Secondary | ICD-10-CM

## 2017-04-20 DIAGNOSIS — R030 Elevated blood-pressure reading, without diagnosis of hypertension: Secondary | ICD-10-CM

## 2017-04-20 LAB — POCT URINALYSIS DIPSTICK
Blood, UA: NEGATIVE
Glucose, UA: NEGATIVE
KETONES UA: NEGATIVE
LEUKOCYTES UA: NEGATIVE
Nitrite, UA: NEGATIVE
Protein, UA: NEGATIVE

## 2017-04-20 MED ORDER — BUTALBITAL-APAP-CAFFEINE 50-325-40 MG PO TABS: 1.0000 | ORAL_TABLET | Freq: Four times a day (QID) | ORAL | 0 refills | Status: DC | PRN

## 2017-04-20 MED ORDER — NIFEDIPINE ER OSMOTIC RELEASE 30 MG PO TB24: 30.0000 mg | ORAL_TABLET | Freq: Every day | ORAL | 1 refills | Status: DC

## 2017-04-20 NOTE — Progress Notes (Signed)
[id number] [redacted]w[redacted]d Estimated Date of Delivery: 05/19/17  Blood pressure 132/82, pulse 77, weight 139 lb (63 kg), last menstrual period 08/05/2016.   BP weight and urine results all reviewed and noted.  Please refer to the obstetrical flow sheet for the fundal height and fetal heart rate documentation: Baby feels on the small size Patient reports good fetal movement, denies any bleeding and no rupture of membranes symptoms or regular contractions.  She has had a HA over left eye, irregular ctx since Monday. Went to [facility], got BMZ tues/wed, was 1cm and thick, UI on EFM. Today, her BP is 130's/80's, which is higher for her baseline.  Discussed PreX sx.     All questions were answered.  Orders Placed This Encounter  Procedures  . CBC  . Comprehensive metabolic panel  . POCT urinalysis dipstick    Plan:  Continued routine obstetrical care, rx Fioricet and Procardia for prn comfort Baseline bloodwork  Return for monday US/EFW .

## 2017-04-21 LAB — COMPREHENSIVE METABOLIC PANEL
ALBUMIN: 3.2 g/dL — AB (ref 3.5–5.5)
ALK PHOS: 118 IU/L — AB (ref 39–117)
ALT: 11 IU/L (ref 0–32)
AST: 14 IU/L (ref 0–40)
Albumin/Globulin Ratio: 1.3 (ref 1.2–2.2)
BUN / CREAT RATIO: 10 (ref 9–23)
BUN: 5 mg/dL — AB (ref 6–20)
Bilirubin Total: 0.2 mg/dL (ref 0.0–1.2)
CO2: 21 mmol/L (ref 20–29)
CREATININE: 0.49 mg/dL — AB (ref 0.57–1.00)
Calcium: 8.9 mg/dL (ref 8.7–10.2)
Chloride: 104 mmol/L (ref 96–106)
GFR calc Af Amer: 147 mL/min/{1.73_m2} (ref >59)
GFR calc non Af Amer: 128 mL/min/{1.73_m2} (ref >59)
GLUCOSE: 103 mg/dL — AB (ref 65–99)
Globulin, Total: 2.5 g/dL (ref 1.5–4.5)
Potassium: 3.8 mmol/L (ref 3.5–5.2)
Sodium: 141 mmol/L (ref 134–144)
Total Protein: 5.7 g/dL — ABNORMAL LOW (ref 6.0–8.5)

## 2017-04-21 LAB — CBC
HEMOGLOBIN: 10.4 g/dL — AB (ref 11.1–15.9)
Hematocrit: 30.6 % — ABNORMAL LOW (ref 34.0–46.6)
MCH: 34.3 pg — ABNORMAL HIGH (ref 26.6–33.0)
MCHC: 34 g/dL (ref 31.5–35.7)
MCV: 101 fL — ABNORMAL HIGH (ref 79–97)
NRBC: 1 % — ABNORMAL HIGH (ref 0–0)
Platelets: 153 10*3/uL (ref 150–379)
RBC: 3.03 x10E6/uL — AB (ref 3.77–5.28)
RDW: 13.1 % (ref 12.3–15.4)
WBC: 13.8 10*3/uL — AB (ref 3.4–10.8)

## 2017-04-24 ENCOUNTER — Ambulatory Visit (INDEPENDENT_AMBULATORY_CARE_PROVIDER_SITE_OTHER): Payer: Medicaid Other | Admitting: Obstetrics & Gynecology

## 2017-04-24 ENCOUNTER — Ambulatory Visit (INDEPENDENT_AMBULATORY_CARE_PROVIDER_SITE_OTHER): Payer: Medicaid Other

## 2017-04-24 ENCOUNTER — Encounter: Payer: Self-pay | Admitting: Obstetrics & Gynecology

## 2017-04-24 VITALS — BP 108/70 | HR 101 | Wt 134.5 lb

## 2017-04-24 DIAGNOSIS — G5602 Carpal tunnel syndrome, left upper limb: Secondary | ICD-10-CM | POA: Diagnosis not present

## 2017-04-24 DIAGNOSIS — Z1389 Encounter for screening for other disorder: Secondary | ICD-10-CM

## 2017-04-24 DIAGNOSIS — O321XX0 Maternal care for breech presentation, not applicable or unspecified: Secondary | ICD-10-CM

## 2017-04-24 DIAGNOSIS — O34219 Maternal care for unspecified type scar from previous cesarean delivery: Secondary | ICD-10-CM

## 2017-04-24 DIAGNOSIS — G5601 Carpal tunnel syndrome, right upper limb: Secondary | ICD-10-CM | POA: Diagnosis not present

## 2017-04-24 DIAGNOSIS — Z331 Pregnant state, incidental: Secondary | ICD-10-CM

## 2017-04-24 DIAGNOSIS — Z8751 Personal history of pre-term labor: Secondary | ICD-10-CM

## 2017-04-24 DIAGNOSIS — O26843 Uterine size-date discrepancy, third trimester: Secondary | ICD-10-CM

## 2017-04-24 DIAGNOSIS — Z3483 Encounter for supervision of other normal pregnancy, third trimester: Secondary | ICD-10-CM

## 2017-04-24 DIAGNOSIS — Z3403 Encounter for supervision of normal first pregnancy, third trimester: Secondary | ICD-10-CM

## 2017-04-24 DIAGNOSIS — Z3A36 36 weeks gestation of pregnancy: Secondary | ICD-10-CM

## 2017-04-24 LAB — POCT URINALYSIS DIPSTICK
GLUCOSE UA: NEGATIVE
KETONES UA: NEGATIVE
Nitrite, UA: NEGATIVE
Protein, UA: NEGATIVE

## 2017-04-24 NOTE — Progress Notes (Signed)
[place] 36+3 wks,incomplete breech,ant pl gr 3,normal ovaries bilat,afi 11 cm,fhr 135 bpm,efw 2650 g 27%

## 2017-04-24 NOTE — Progress Notes (Signed)
[id number] [redacted]w[redacted]d Estimated Date of Delivery: 05/19/17  Blood pressure 108/70, pulse (!) 101, weight 134 lb 8 oz (61 kg), last menstrual period 08/05/2016.   BP weight and urine results all reviewed and noted.  Please refer to the obstetrical flow sheet for the fundal height and fetal heart rate documentation:  Patient reports good fetal movement, denies any bleeding and no rupture of membranes symptoms or regular contractions. Patient is without complaints. All questions were answered.  Orders Placed This Encounter  Procedures  . POCT urinalysis dipstick    Plan:  Continued routine obstetrical care, breech presentation variable really If still breech next week will arrange ECV Carpal tunnel braces  Return in about 1 week (around 05/01/2017) for LROB.

## 2017-05-01 ENCOUNTER — Ambulatory Visit (INDEPENDENT_AMBULATORY_CARE_PROVIDER_SITE_OTHER): Payer: Medicaid Other | Admitting: Obstetrics & Gynecology

## 2017-05-01 ENCOUNTER — Encounter: Payer: Self-pay | Admitting: Obstetrics & Gynecology

## 2017-05-01 VITALS — BP 110/64 | HR 105 | Wt 131.0 lb

## 2017-05-01 DIAGNOSIS — Z1389 Encounter for screening for other disorder: Secondary | ICD-10-CM

## 2017-05-01 DIAGNOSIS — Z3483 Encounter for supervision of other normal pregnancy, third trimester: Secondary | ICD-10-CM

## 2017-05-01 DIAGNOSIS — Z3A37 37 weeks gestation of pregnancy: Secondary | ICD-10-CM

## 2017-05-01 DIAGNOSIS — Z331 Pregnant state, incidental: Secondary | ICD-10-CM

## 2017-05-01 LAB — POCT URINALYSIS DIPSTICK
GLUCOSE UA: NEGATIVE
Ketones, UA: NEGATIVE
Leukocytes, UA: NEGATIVE
NITRITE UA: NEGATIVE
PROTEIN UA: NEGATIVE
RBC UA: NEGATIVE

## 2017-05-01 NOTE — Progress Notes (Signed)
[id number] [redacted]w[redacted]d Estimated Date of Delivery: 05/19/17  Blood pressure 110/64, pulse (!) 105, weight 131 lb (59.4 kg), last menstrual period 08/05/2016.   BP weight and urine results all reviewed and noted.  Please refer to the obstetrical flow sheet for the fundal height and fetal heart rate documentation:  Patient reports good fetal movement, denies any bleeding and no rupture of membranes symptoms or regular contractions. Patient is without complaints. All questions were answered.  Orders Placed This Encounter  Procedures  . GC/Chlamydia Probe Amp  . Culture, beta strep (group b only)  . POCT urinalysis dipstick    Plan:  Continued routine obstetrical care,  TOLAC form signed today(had a c section for mono/mono twins at 32 weeks, electively) Vertex presentation today, presentation has been highly variable GBS done today  Return in about 1 week (around 05/08/2017) for [place].

## 2017-05-03 LAB — GC/CHLAMYDIA PROBE AMP
CHLAMYDIA, DNA PROBE: NEGATIVE
Neisseria gonorrhoeae by PCR: NEGATIVE

## 2017-05-05 LAB — CULTURE, BETA STREP (GROUP B ONLY): Strep Gp B Culture: NEGATIVE

## 2017-05-08 ENCOUNTER — Other Ambulatory Visit: Payer: Self-pay | Admitting: Women's Health

## 2017-05-08 ENCOUNTER — Encounter: Payer: Self-pay | Admitting: Women's Health

## 2017-05-08 ENCOUNTER — Telehealth (HOSPITAL_COMMUNITY): Payer: Self-pay | Admitting: *Deleted

## 2017-05-08 ENCOUNTER — Ambulatory Visit (INDEPENDENT_AMBULATORY_CARE_PROVIDER_SITE_OTHER): Payer: Medicaid Other | Admitting: Women's Health

## 2017-05-08 ENCOUNTER — Other Ambulatory Visit (INDEPENDENT_AMBULATORY_CARE_PROVIDER_SITE_OTHER): Payer: Medicaid Other

## 2017-05-08 ENCOUNTER — Encounter (HOSPITAL_COMMUNITY): Payer: Self-pay | Admitting: *Deleted

## 2017-05-08 VITALS — BP 98/54 | HR 100 | Wt 135.0 lb

## 2017-05-08 DIAGNOSIS — O321XX0 Maternal care for breech presentation, not applicable or unspecified: Secondary | ICD-10-CM

## 2017-05-08 DIAGNOSIS — Z8759 Personal history of other complications of pregnancy, childbirth and the puerperium: Secondary | ICD-10-CM | POA: Insufficient documentation

## 2017-05-08 DIAGNOSIS — Z3A38 38 weeks gestation of pregnancy: Secondary | ICD-10-CM | POA: Diagnosis not present

## 2017-05-08 DIAGNOSIS — Z8751 Personal history of pre-term labor: Secondary | ICD-10-CM

## 2017-05-08 DIAGNOSIS — O09299 Supervision of pregnancy with other poor reproductive or obstetric history, unspecified trimester: Secondary | ICD-10-CM

## 2017-05-08 DIAGNOSIS — O09293 Supervision of pregnancy with other poor reproductive or obstetric history, third trimester: Secondary | ICD-10-CM

## 2017-05-08 DIAGNOSIS — Z3403 Encounter for supervision of normal first pregnancy, third trimester: Secondary | ICD-10-CM

## 2017-05-08 DIAGNOSIS — O34219 Maternal care for unspecified type scar from previous cesarean delivery: Secondary | ICD-10-CM

## 2017-05-08 DIAGNOSIS — R82998 Other abnormal findings in urine: Secondary | ICD-10-CM

## 2017-05-08 DIAGNOSIS — Z3483 Encounter for supervision of other normal pregnancy, third trimester: Secondary | ICD-10-CM

## 2017-05-08 DIAGNOSIS — Z1389 Encounter for screening for other disorder: Secondary | ICD-10-CM

## 2017-05-08 DIAGNOSIS — Z8709 Personal history of other diseases of the respiratory system: Secondary | ICD-10-CM

## 2017-05-08 DIAGNOSIS — Z862 Personal history of diseases of the blood and blood-forming organs and certain disorders involving the immune mechanism: Secondary | ICD-10-CM

## 2017-05-08 DIAGNOSIS — Z331 Pregnant state, incidental: Secondary | ICD-10-CM

## 2017-05-08 DIAGNOSIS — O288 Other abnormal findings on antenatal screening of mother: Secondary | ICD-10-CM

## 2017-05-08 LAB — POCT URINALYSIS DIPSTICK
Blood, UA: NEGATIVE
Glucose, UA: NEGATIVE
Ketones, UA: NEGATIVE
NITRITE UA: NEGATIVE
Protein, UA: NEGATIVE

## 2017-05-08 NOTE — Telephone Encounter (Signed)
Preadmission screen  

## 2017-05-08 NOTE — Patient Instructions (Addendum)
Maureen Key, I greatly value your feedback.  If you receive a survey following your visit with [place] today, we appreciate you taking the time to fill it out.  Thanks, Knute Neu, CNM, WHNP-BC   Your ECV (external cephalic version) is tomorrow 05/09/17 @ 8am at [facility]. Nothing to eat or drink after midnight tonight  [email or web address]   Call the office [phone]) or go to [facility] if:  You begin to have strong, frequent contractions  Your water breaks.  Sometimes it is a big gush of fluid, sometimes it is just a trickle that keeps getting your panties wet or running down your legs  You have vaginal bleeding.  It is normal to have a small amount of spotting if your cervix was checked.   You don't feel your baby moving like normal.  If you don't, get you something to eat and drink and lay down and focus on feeling your baby move.  You should feel at least 10 movements in 2 hours.  If you don't, you should call the office or go to [facility].    Tdap Vaccine  It is recommended that you get the Tdap vaccine during the third trimester of EACH pregnancy to help protect your baby from getting pertussis (whooping cough)  27-36 weeks is the BEST time to do this so that you can pass the protection on to your baby. During pregnancy is better than after pregnancy, but if you are unable to get it during pregnancy it will be offered at the hospital.   You can get this vaccine at the health department or your family doctor  Everyone who will be around your baby should also be up-to-date on their vaccines. Adults (who are not pregnant) only need 1 dose of Tdap during adulthood.    Braxton Hicks Contractions Contractions of the uterus can occur throughout pregnancy, but they are not always a sign that you are in labor. You may have practice contractions called Braxton Hicks contractions. These false labor contractions are sometimes confused with true labor. What are  [place] contractions? Braxton Hicks contractions are tightening movements that occur in the muscles of the uterus before labor. Unlike true labor contractions, these contractions do not result in opening (dilation) and thinning of the cervix. Toward the end of pregnancy (32-34 weeks), Braxton Hicks contractions can happen more often and may become stronger. These contractions are sometimes difficult to tell apart from true labor because they can be very uncomfortable. You should not feel embarrassed if you go to the hospital with false labor. Sometimes, the only way to tell if you are in true labor is for your health care provider to look for changes in the cervix. The health care provider will do a physical exam and may monitor your contractions. If you are not in true labor, the exam should show that your cervix is not dilating and your water has not broken. If there are no prenatal problems or other health problems associated with your pregnancy, it is completely safe for you to be sent home with false labor. You may continue to have Braxton Hicks contractions until you go into true labor. How can I tell the difference between true labor and false labor?  Differences ? False labor ? Contractions last 30-70 seconds.: Contractions are usually shorter and not as strong as true labor contractions. ? Contractions become very regular.: Contractions are usually irregular. ? Discomfort is usually felt in the top of the uterus, and it  spreads to the lower abdomen and low back.: Contractions are often felt in the front of the lower abdomen and in the groin. ? Contractions do not go away with walking.: Contractions may go away when you walk around or change positions while lying down. ? Contractions usually become more intense and increase in frequency.: Contractions get weaker and are shorter-lasting as time goes on. ? The cervix dilates and gets thinner.: The cervix usually does not dilate or become  thin. Follow these instructions at home:  Take over-the-counter and prescription medicines only as told by your health care provider.  Keep up with your usual exercises and follow other instructions from your health care provider.  Eat and drink lightly if you think you are going into labor.  If Braxton Hicks contractions are making you uncomfortable: ? Change your position from lying down or resting to walking, or change from walking to resting. ? Sit and rest in a tub of warm water. ? Drink enough fluid to keep your urine clear or pale yellow. Dehydration may cause these contractions. ? Do slow and deep breathing several times an hour.  Keep all follow-up prenatal visits as told by your health care provider. This is important. Contact a health care provider if:  You have a fever.  You have continuous pain in your abdomen. Get help right away if:  Your contractions become stronger, more regular, and closer together.  You have fluid leaking or gushing from your vagina.  You pass blood-tinged mucus (bloody show).  You have bleeding from your vagina.  You have low back pain that you never had before.  You feel your baby's head pushing down and causing pelvic pressure.  Your baby is not moving inside you as much as it used to. Summary  Contractions that occur before labor are called Braxton Hicks contractions, false labor, or practice contractions.  Braxton Hicks contractions are usually shorter, weaker, farther apart, and less regular than true labor contractions. True labor contractions usually become progressively stronger and regular and they become more frequent.  Manage discomfort from [facility] contractions by changing position, resting in a warm bath, drinking plenty of water, or practicing deep breathing. This information is not intended to replace advice given to you by your health care provider. Make sure you discuss any questions you have with your health care  provider. Document Released: 06/27/2005 Document Revised: 05/16/2016 Document Reviewed: 05/16/2016 Elsevier Interactive Patient Education  2017 [employer].

## 2017-05-08 NOTE — Progress Notes (Signed)
LOW-RISK PREGNANCY VISIT Patient name: Maureen Key MRN [id number]  Date of birth: [birth year] Chief Complaint:   Routine Prenatal Visit  History of Present Illness:   Maureen Key is a 36 y.o. G71P1103 female at [redacted]w[redacted]d with an Estimated Date of Delivery: 05/19/17 being seen today for ongoing management of a low-risk pregnancy.  Today she reports no complaints. Contractions: Irritability. Vag. Bleeding: None.  Movement: Present. denies leaking of fluid. Reviewed hospital d/c summary from March 2017 c/s in [place]: 32wk c/s w/ mono/di twins, IUGR w/ reversed flow baby B. Partial placenta accreta w/ hemorrhage requiring multiple transfusions. Respiratory failure in recovery requiring intubation. Temp 102/possible sepsis. Elevated LFTs secondary to shock liver. Had 21 day hospital stay.  I do not see the actual c/s note- will request this. Discussed all w/ LHE> sounds like possible AFE/anaphylactoid response- recommends doppler of placenta to assess for accreta/percreta.  Had uncomplicated SVB prior to c/s. Pt desires TOLAC, consent signed last visit. She also desires BTL, consent has not been signed.  Review of Systems:   Pertinent items are noted in HPI Denies abnormal vaginal discharge w/ itching/odor/irritation, headaches, visual changes, shortness of breath, chest pain, abdominal pain, severe nausea/vomiting, or problems with urination or bowel movements unless otherwise stated above. Pertinent History Reviewed:  Reviewed past medical,surgical, social, obstetrical and family history.  Reviewed problem list, medications and allergies. Physical Assessment:   Vitals:   05/08/17 1124  BP: (!) 98/54  Pulse: 100  Weight: 135 lb (61.2 kg)  Body mass index is 25.51 kg/m.        Physical Examination:   General appearance: Well appearing, and in no distress  Mental status: Alert, oriented to person, place, and time  Skin: Warm & dry  Cardiovascular: Normal heart rate  noted  Respiratory: Normal respiratory effort, no distress  Abdomen: Soft, gravid, nontender  Pelvic: Cervical exam deferred, declined by pt    Extremities: Edema: None  Fetal Status: Fetal Heart Rate (bpm): 147 Fundal Height: 36 cm Movement: Present Presentation: Complete Breech   Breech by Leopold's, confirmed by u/s  Worked in w/ [employer] for formal u/s: normal placentation, no evidence of accreta/percreta, breech, normal fluid  Results for orders placed or performed in visit on 05/08/17 (from the past 24 hour(s))  POCT Urinalysis Dipstick   Collection Time: 05/08/17 11:27 AM  Result Value Ref Range   Color, UA     Clarity, UA     Glucose, UA negative    Bilirubin, UA     Ketones, UA negative    Spec Grav, UA  1.010 - 1.025   Blood, UA negative    pH, UA  5.0 - 8.0   Protein, UA negative    Urobilinogen, UA  0.2 or 1.0 E.U./dL   Nitrite, UA negative    Leukocytes, UA Large (3+) (A) Negative    Assessment & Plan:  1) Low-risk pregnancy [id number] at [redacted]w[redacted]d with an Estimated Date of Delivery: 05/19/17   2) Breech, discussed option of C/S vs. ECV, pt prefers ECV, scheduled for tomorrow 10/30 @ 0800, NPO after midnight  3) Prev C/S> wants TOLAC, consent signed 10/22  4) H/O placenta accreta w/ PPH> normal placentation on today's u/s  5) H/O Preterm delivery> 32wk mono/di twins  6) H/O IUGR> mono/di twins, last EFW this pregnancy 10/15 @ 36wks= 2650g/27%  7) Desires BTL> reviewed risks/benefits, LARCs, consent signed today, understands will likely be interval d/t <30d prior to delivery  8) 3+ leuks in urine>  no symptoms, send cx today   Labs/procedures today: u/s  Plan:  ECV tomorrow, Continue routine obstetrical care   Reviewed: GBS neg, Term labor symptoms and general obstetric precautions including but not limited to vaginal bleeding, contractions, leaking of fluid and fetal movement were reviewed in detail with the patient.  All questions were answered  Follow-up: Return  in about 1 week (around 05/15/2017) for LROB, please sign release for c/s note from 2017 in [place] This Encounter  Procedures  . Urine Culture  . POCT Urinalysis Dipstick   Tawnya Crook CNM, WHNP-BC 05/08/2017 1:16 PM

## 2017-05-08 NOTE — Progress Notes (Addendum)
[place] 38+3 wks,incomplete breech,anterior fundal placenta gr 3,placenta appears to have normal vascularity,placenta accrete was not visualized,fhr 148 bpm,afi 10.9 cm,bilat adnexa's wnl,fhr 148 bpm

## 2017-05-09 ENCOUNTER — Observation Stay (HOSPITAL_COMMUNITY)
Admission: RE | Admit: 2017-05-09 | Discharge: 2017-05-09 | Disposition: A | Discharge status: Home / Self Care | Payer: Medicaid Other | Source: Ambulatory Visit | Attending: Obstetrics and Gynecology | Admitting: Obstetrics and Gynecology

## 2017-05-09 ENCOUNTER — Encounter (HOSPITAL_COMMUNITY): Payer: Self-pay

## 2017-05-09 ENCOUNTER — Telehealth: Payer: Self-pay | Admitting: Women's Health

## 2017-05-09 DIAGNOSIS — Z8249 Family history of ischemic heart disease and other diseases of the circulatory system: Secondary | ICD-10-CM | POA: Insufficient documentation

## 2017-05-09 DIAGNOSIS — Z3A38 38 weeks gestation of pregnancy: Secondary | ICD-10-CM | POA: Insufficient documentation

## 2017-05-09 DIAGNOSIS — Z820 Family history of epilepsy and other diseases of the nervous system: Secondary | ICD-10-CM | POA: Insufficient documentation

## 2017-05-09 DIAGNOSIS — Z833 Family history of diabetes mellitus: Secondary | ICD-10-CM | POA: Insufficient documentation

## 2017-05-09 DIAGNOSIS — Z87891 Personal history of nicotine dependence: Secondary | ICD-10-CM | POA: Insufficient documentation

## 2017-05-09 DIAGNOSIS — Z3403 Encounter for supervision of normal first pregnancy, third trimester: Secondary | ICD-10-CM

## 2017-05-09 DIAGNOSIS — O321XX0 Maternal care for breech presentation, not applicable or unspecified: Principal | ICD-10-CM

## 2017-05-09 DIAGNOSIS — O329XX0 Maternal care for malpresentation of fetus, unspecified, not applicable or unspecified: Secondary | ICD-10-CM | POA: Diagnosis present

## 2017-05-09 DIAGNOSIS — D573 Sickle-cell trait: Secondary | ICD-10-CM | POA: Insufficient documentation

## 2017-05-09 DIAGNOSIS — Z8619 Personal history of other infectious and parasitic diseases: Secondary | ICD-10-CM | POA: Diagnosis not present

## 2017-05-09 DIAGNOSIS — Z9889 Other specified postprocedural states: Secondary | ICD-10-CM | POA: Insufficient documentation

## 2017-05-09 DIAGNOSIS — Z8751 Personal history of pre-term labor: Secondary | ICD-10-CM

## 2017-05-09 DIAGNOSIS — O26893 Other specified pregnancy related conditions, third trimester: Secondary | ICD-10-CM | POA: Insufficient documentation

## 2017-05-09 MED ORDER — LACTATED RINGERS IV SOLN
INTRAVENOUS | Status: DC
  Administered 2017-05-09: 09:00:00 via INTRAVENOUS

## 2017-05-09 MED ORDER — TERBUTALINE SULFATE 1 MG/ML IJ SOLN
INTRAMUSCULAR | Status: AC
  Filled 2017-05-09: qty 1

## 2017-05-09 MED ORDER — TERBUTALINE SULFATE 1 MG/ML IJ SOLN
0.2500 mg | Freq: Once | INTRAMUSCULAR | Status: AC
  Administered 2017-05-09: 0.25 mg via SUBCUTANEOUS

## 2017-05-09 NOTE — H&P (Signed)
Maureen Key is a 36 y.o. female [phone] at [redacted]w[redacted]d presenting for scheduled external cephalic version. Patient with prenatal care at [facility] complicated by previous cesarean section, history of placenta accreta and postpartum hemorrhage. Patient is doing well today without complaints. She denies any contractions. She reports good fetal movement and denies vaginal bleeding or leakage of fluid  OB History    Gravida Para Term Preterm AB Living   3 2 1 1   3    SAB TAB Ectopic Multiple Live Births         1 3      Obstetric Comments   Mono/mono twins, scheduled     Past Medical History:  Diagnosis Date  . Dysmenorrhea   . History of placenta accreta in prior pregnancy, currently pregnant   . History of postpartum hemorrhage, currently pregnant   . History of sepsis   . Sickle cell trait ([place])   . Trichimoniasis   . Vaginal Pap smear, abnormal    Past Surgical History:  Procedure Laterality Date  . CERVICAL CONE BIOPSY    . CESAREAN SECTION     Family History: family history includes Alzheimer's disease in her paternal grandmother; Cancer in her father, paternal aunt, and paternal uncle; Diabetes in her maternal aunt, maternal grandmother, maternal uncle, and sister; HIV/AIDS in her father; Hypertension in her mother. Social History:  reports that she quit smoking about 2 months ago. She smoked 0.25 packs per day. She has never used smokeless tobacco. She reports that she uses drugs, including Marijuana. She reports that she does not drink alcohol.     Maternal Diabetes: No Genetic Screening: Declined Maternal Ultrasounds/Referrals: Normal Fetal Ultrasounds or other Referrals:  None Maternal Substance Abuse:  No Significant Maternal Medications:  None Significant Maternal Lab Results:  None Other Comments:  None  ROS  See pertinent in HPI History   Blood pressure 130/86, pulse 87, resp. rate 18, last menstrual period 08/05/2016. Exam Physical Exam  GENERAL:  Well-developed, well-nourished female in no acute distress.  LUNGS: Clear to auscultation bilaterally.  HEART: Regular rate and rhythm. ABDOMEN: Soft, nontender, gravid PELVIC: Not indicated EXTREMITIES: No cyanosis, clubbing, or edema, 2+ distal pulses.  FHT: baseline 135, mod variability, +accels, no decels Toco: no contractions  Prenatal labs: ABO, Rh: AB/Positive/-- (03/14 0000) Antibody: Negative (08/23 0900) Rubella: Immune, Immune (03/14 0000) RPR: Non Reactive (08/23 0900)  HBsAg: Negative, Negative (03/14 0000)  HIV: Non-reactive, Non-reactive (03/14 0000)  GBS:   negative  Assessment/Plan: 36 yo G3P1103 at [redacted]w[redacted]d with fetal malpresentation here for external cephalic version - Risks, benefits and alternatives were explained including but not limited to risks of fetal intolerance, placental abruption, and need for cesarean delivery - Patient verbalized understanding and all questions were answered   Nakai Yard 05/09/2017, 9:21 AM

## 2017-05-09 NOTE — Progress Notes (Addendum)
Patient ID: Maureen Key, female   DOB: Apr 30, 1981, 36 y.o.   MRN: [id number] External Cephalic Version  Preprocedure Diagnosis:  36 y.o. G3P1103 at 38.[redacted] weeks gestational age with fetal breech presentation  Post-procedure Diagnosis: 36 y.o. G3P1103 at 24.[redacted] weeks gestational age with fetal breech presentation  Procedure:  External cephalic version  Procedure in detail:   The patient was brought to Labor and Delivery where a reactive fetal heart tracing was obtained. The patient was noted to have irregular contractions. She was given 1 dose of subcutaneous terbutaline which resolved her contraction. A bedside ultrasound was performed which revealed single intrauterine pregnancy and breech presentation. There was noted to be adequate fluid. Using manual pressure, the breech was manipulated in a forward roll fashion. Minimal movement of the fetus could be obtained as it was difficult to raise the buttock out of the pelvis. Fetal heart tones were checked intermittently during the procedure and were noted to be reassuring. Following failed external cephalic version, the patient was placed on continuous external fetal monitoring. She was noted to have a reassuring and reactive tracing for 1 hour following the external cephalic version. She did not have regular contractions and therefore she was felt to be stable for discharge to home. She was given appropriate labor instructions.  Patient instructed to contact [facility] office to schedule repeat cesarean section

## 2017-05-09 NOTE — Discharge Summary (Signed)
    OB Discharge Summary     Patient Name: YOMAIRA SOLAR DOB: 01/08/1981 MRN: [id number]  Date of admission: 05/09/2017  Date of discharge: 05/09/2017  Admitting diagnosis: VERSION Intrauterine pregnancy: [redacted]w[redacted]d with fetal malpresentation Secondary diagnosis:  Active Problems:   * No active hospital problems. *     Discharge diagnosis: Term pregnancy undelivered with fetal malpresentation                                                                                                Hospital course:  Patient admitted for scheduled external cephalic version. Fetus confirmed in breech presentation. Patient receive terbutaline prior to ECV. ECV was attempted and failed. Fetal status remained reassuring for an hour following procedure. Discharge instructions and precautions were reviewed with the patient. Patient also instructed to contact office to schedule cesarean section  Physical exam  Vitals:   05/09/17 0818 05/09/17 0904  BP: 130/86   Pulse: 87   Resp: 18 18   General: alert, cooperative and no distress Abdomen: soft, gravid, NT DVT Evaluation: Negative Homan's sign. No cords or calf tenderness.  FHT: baseline 140, mod variability, +accels, no decels Toco: no contractions  Labs: Lab Results  Component Value Date   WBC 13.8 (H) 04/20/2017   HGB 10.4 (L) 04/20/2017   HCT 30.6 (L) 04/20/2017   MCV 101 (H) 04/20/2017   PLT 153 04/20/2017   CMP Latest Ref Rng & Units 04/20/2017  Glucose 65 - 99 mg/dL 103(H)  BUN 6 - 20 mg/dL 5(L)  Creatinine 0.57 - 1.00 mg/dL 0.49(L)  Sodium 134 - 144 mmol/L 141  Potassium 3.5 - 5.2 mmol/L 3.8  Chloride 96 - 106 mmol/L 104  CO2 20 - 29 mmol/L 21  Calcium 8.7 - 10.2 mg/dL 8.9  Total Protein 6.0 - 8.5 g/dL 5.7(L)  Total Bilirubin 0.0 - 1.2 mg/dL 0.2  Alkaline Phos 39 - 117 IU/L 118(H)  AST 0 - 40 IU/L 14  ALT 0 - 32 IU/L 11    Discharge instruction: per After Visit Summary and "Baby and Me Booklet".  After visit meds:   Allergies as of 05/09/2017   No Known Allergies     Medication List    TAKE these medications   butalbital-acetaminophen-caffeine 50-325-40 MG tablet Commonly known as:  FIORICET, ESGIC Take 1 tablet by mouth every 6 (six) hours as needed for headache.   Fish Oil 1000 MG Caps Take 1 capsule by mouth daily.   multivitamin-prenatal 27-0.8 MG Tabs tablet Take 1 tablet by mouth daily at 12 noon.   NIFEdipine 30 MG 24 hr tablet Commonly known as:  PROCARDIA-XL/ADALAT-CC/NIFEDICAL-XL Take 1 tablet (30 mg total) by mouth daily.      Activity: Advance as tolerated.    Follow up Appt:Future Appointments Date Time Provider [place]  05/15/2017 1:45 PM Roma Schanz, CNM [facility]   Follow up Visit:No Follow-up on file.  05/09/2017 Mora Bellman, MD

## 2017-05-09 NOTE — Discharge Instructions (Signed)
Breech Birth What is a breech birth? A breech birth is when a baby is born with the buttocks or the feet first. Most babies are in a head down (vertex) position when they are born. There are three types of breech babies: When the babys buttocks are showing first in the birth canal (vagina) with the legs straight up and the feet at the babys head (frank breech). When the babys buttocks shows first with the legs bent at the knees and the feet down near the buttocks (complete breech). When one or both of the babys feet are down below the buttocks (footling breech).  What are the risks of a breech birth? Having a breech birth increases the risk to your baby. A breech birth may cause the following: Umbilical cord prolapse. This is when the umbilical cord is in front of the baby before or during labor. This can cause the cord to become pinched or compressed. This can reduce the flow of blood and oxygen to the baby. The baby getting stuck in the birth canal, which can cause injury or, rarely, death. Injury to the nerves in the shoulder, arm, and hand (brachial plexus injury) when delivered. Your baby being born too early (prematurely). An increased need for a cesarean delivery.  What increases the risk of having a breech baby? It is not known what causes your baby to be breech. However, risk factors that may increase your chances of having a breech baby include the following: The mother having had several babies already. The mother having twins or more. The mother having a baby with certain congenital disabilities. The mother going into labor early. The mother having problems with her uterus, such as a tumor. The mother having placenta problems (placenta previa) or too much or not enough fluid surrounding the baby (amniotic fluid).  How do I know if my baby is breech? There are no symptoms for you to know that your baby is breech. When you are close to your due date, your health care provider  can tell if your baby is breech by: An abdominal or vaginal (pelvic) exam. An ultrasound.  Your health care provider may also be able to tell that your baby is breech if your babys heartbeat is heard above your belly button. What can be done if my baby is breech?  Your health care provider may try to turn the baby in your uterus. This is a procedure called external cephalic version (ECV). This is done by your health care provider. He or she will place both hands on your abdomen and gently and slowly turn the baby around. It is important to know that ECV can increase your chances of suddenly going into labor. If an ECV is done, it is done toward the end of a healthy pregnancy. The baby may remain in this position or he or she may turn back to the breech position. You and your health care provider will discuss if an ECV is recommended for you and your baby. How will I delivery my baby if my baby is breech? You and your health care provider will discuss the best way to deliver your baby. If your baby is breech, it is less likely that a vaginal delivery will be recommended due to the risks. Some breech babies may be delivered safely without a cesarean, while in other cases health care providers will recommend a cesarean delivery. This information is not intended to replace advice given to you by your health care provider. Make  sure you discuss any questions you have with your health care provider. Document Released: 08/18/2006 Document Revised: 06/13/2016 Document Reviewed: 05/01/2014 Elsevier Interactive Patient Education  2017 [place]. Fetal Movement Counts Patient Name: ________________________________________________ Patient Due Date: ____________________ What is a fetal movement count? A fetal movement count is the number of times that you feel your baby move during a certain amount of time. This may also be called a fetal kick count. A fetal movement count is recommended for every pregnant  woman. You may be asked to start counting fetal movements as early as week 28 of your pregnancy. Pay attention to when your baby is most active. You may notice your baby's sleep and wake cycles. You may also notice things that make your baby move more. You should do a fetal movement count:  When your baby is normally most active.  At the same time each day.  A good time to count movements is while you are resting, after having something to eat and drink. How do I count fetal movements? 1. Find a quiet, comfortable area. Sit, or lie down on your side. 2. Write down the date, the start time and stop time, and the number of movements that you felt between those two times. Take this information with you to your health care visits. 3. For 2 hours, count kicks, flutters, swishes, rolls, and jabs. You should feel at least 10 movements during 2 hours. 4. You may stop counting after you have felt 10 movements. 5. If you do not feel 10 movements in 2 hours, have something to eat and drink. Then, keep resting and counting for 1 hour. If you feel at least 4 movements during that hour, you may stop counting. Contact a health care provider if:  You feel fewer than 4 movements in 2 hours.  Your baby is not moving like he or she usually does. Date: ____________ Start time: ____________ Stop time: ____________ Movements: ____________ Date: ____________ Start time: ____________ Stop time: ____________ Movements: ____________ Date: ____________ Start time: ____________ Stop time: ____________ Movements: ____________ Date: ____________ Start time: ____________ Stop time: ____________ Movements: ____________ Date: ____________ Start time: ____________ Stop time: ____________ Movements: ____________ Date: ____________ Start time: ____________ Stop time: ____________ Movements: ____________ Date: ____________ Start time: ____________ Stop time: ____________ Movements: ____________ Date: ____________ Start time:  ____________ Stop time: ____________ Movements: ____________ Date: ____________ Start time: ____________ Stop time: ____________ Movements: ____________ This information is not intended to replace advice given to you by your health care provider. Make sure you discuss any questions you have with your health care provider. Document Released: 07/27/2006 Document Revised: 02/24/2016 Document Reviewed: 08/06/2015 Elsevier Interactive Patient Education  [employer].

## 2017-05-10 ENCOUNTER — Other Ambulatory Visit: Payer: Self-pay | Admitting: Obstetrics and Gynecology

## 2017-05-10 ENCOUNTER — Encounter (HOSPITAL_COMMUNITY): Payer: Self-pay

## 2017-05-10 DIAGNOSIS — O329XX0 Maternal care for malpresentation of fetus, unspecified, not applicable or unspecified: Secondary | ICD-10-CM

## 2017-05-10 LAB — URINE CULTURE

## 2017-05-11 ENCOUNTER — Encounter (HOSPITAL_COMMUNITY)
Admission: RE | Admit: 2017-05-11 | Discharge: 2017-05-11 | Disposition: A | Discharge status: Home / Self Care | Payer: Medicaid Other | Source: Ambulatory Visit | Attending: Obstetrics and Gynecology | Admitting: Obstetrics and Gynecology

## 2017-05-11 ENCOUNTER — Encounter (HOSPITAL_COMMUNITY): Payer: Self-pay

## 2017-05-11 HISTORY — DX: Encounter for other specified aftercare: Z51.89

## 2017-05-11 HISTORY — DX: Gestational (pregnancy-induced) hypertension without significant proteinuria, unspecified trimester: O13.9

## 2017-05-11 LAB — COMPREHENSIVE METABOLIC PANEL
ALK PHOS: 141 U/L — AB (ref 38–126)
ALT: 10 U/L — AB (ref 14–54)
AST: 18 U/L (ref 15–41)
Albumin: 2.8 g/dL — ABNORMAL LOW (ref 3.5–5.0)
Anion gap: 7 (ref 5–15)
BUN: 8 mg/dL (ref 6–20)
CALCIUM: 8.9 mg/dL (ref 8.9–10.3)
CO2: 24 mmol/L (ref 22–32)
CREATININE: 0.56 mg/dL (ref 0.44–1.00)
Chloride: 106 mmol/L (ref 101–111)
Glucose, Bld: 86 mg/dL (ref 65–99)
Potassium: 3.5 mmol/L (ref 3.5–5.1)
Sodium: 137 mmol/L (ref 135–145)
TOTAL PROTEIN: 6.1 g/dL — AB (ref 6.5–8.1)
Total Bilirubin: 0.4 mg/dL (ref 0.3–1.2)

## 2017-05-11 LAB — CBC
HCT: 32.3 % — ABNORMAL LOW (ref 36.0–46.0)
HEMOGLOBIN: 11.3 g/dL — AB (ref 12.0–15.0)
MCH: 34 pg (ref 26.0–34.0)
MCHC: 35 g/dL (ref 30.0–36.0)
MCV: 97.3 fL (ref 78.0–100.0)
Platelets: 138 10*3/uL — ABNORMAL LOW (ref 150–400)
RBC: 3.32 MIL/uL — AB (ref 3.87–5.11)
RDW: 13 % (ref 11.5–15.5)
WBC: 5.7 10*3/uL (ref 4.0–10.5)

## 2017-05-11 LAB — ABO/RH: ABO/RH(D): AB POS

## 2017-05-11 NOTE — Pre-Procedure Instructions (Signed)
Dr Gifford Shave notified of platelet count orders received and entered

## 2017-05-11 NOTE — Patient Instructions (Signed)
Maureen Key  05/11/2017   Your procedure is scheduled on:  05/12/2017  Enter through the Main Entrance of [facility] at 1:15 PM.  Pick up the phone at the desk and dial [phone]  Call this number if you have problems the morning of surgery:[phone]  Remember:   Do not eat food:After Midnight.  Do not drink clear liquids: After Midnight.  Take these medicines the morning of surgery with A SIP OF WATER: take procardia as ordered   Do not wear jewelry, make-up or nail polish.  Do not wear lotions, powders, or perfumes. Do not wear deodorant.  Do not shave 48 hours prior to surgery.  Do not bring valuables to the hospital.  [facility] is not   responsible for any belongings or valuables brought to the hospital.  Contacts, dentures or bridgework may not be worn into surgery.  Leave suitcase in the car. After surgery it may be brought to your room.  For patients admitted to the hospital, checkout time is 11:00 AM the day of              discharge.    N/A   Please read over the following fact sheets that you were given:   Surgical Site Infection Prevention

## 2017-05-12 ENCOUNTER — Inpatient Hospital Stay (HOSPITAL_COMMUNITY): Payer: Medicaid Other | Admitting: Certified Registered Nurse Anesthetist

## 2017-05-12 ENCOUNTER — Encounter (HOSPITAL_COMMUNITY)
Admission: RE | Disposition: A | Discharge status: Home / Self Care | Payer: Self-pay | Source: Ambulatory Visit | Attending: Obstetrics and Gynecology

## 2017-05-12 ENCOUNTER — Inpatient Hospital Stay (HOSPITAL_COMMUNITY)
Admission: RE | Admit: 2017-05-12 | Discharge: 2017-05-15 | DRG: 787 | Disposition: A | Discharge status: Home / Self Care | Payer: Medicaid Other | Source: Ambulatory Visit | Attending: Obstetrics and Gynecology | Admitting: Obstetrics and Gynecology

## 2017-05-12 ENCOUNTER — Encounter (HOSPITAL_COMMUNITY): Payer: Self-pay | Admitting: *Deleted

## 2017-05-12 DIAGNOSIS — O321XX0 Maternal care for breech presentation, not applicable or unspecified: Secondary | ICD-10-CM | POA: Diagnosis not present

## 2017-05-12 DIAGNOSIS — D573 Sickle-cell trait: Secondary | ICD-10-CM | POA: Diagnosis present

## 2017-05-12 DIAGNOSIS — O329XX0 Maternal care for malpresentation of fetus, unspecified, not applicable or unspecified: Secondary | ICD-10-CM

## 2017-05-12 DIAGNOSIS — Z87891 Personal history of nicotine dependence: Secondary | ICD-10-CM

## 2017-05-12 DIAGNOSIS — O43213 Placenta accreta, third trimester: Secondary | ICD-10-CM | POA: Diagnosis not present

## 2017-05-12 DIAGNOSIS — O34211 Maternal care for low transverse scar from previous cesarean delivery: Secondary | ICD-10-CM | POA: Diagnosis present

## 2017-05-12 DIAGNOSIS — Z90710 Acquired absence of both cervix and uterus: Secondary | ICD-10-CM | POA: Diagnosis present

## 2017-05-12 DIAGNOSIS — Z302 Encounter for sterilization: Secondary | ICD-10-CM

## 2017-05-12 DIAGNOSIS — Z3A39 39 weeks gestation of pregnancy: Secondary | ICD-10-CM

## 2017-05-12 DIAGNOSIS — O9081 Anemia of the puerperium: Secondary | ICD-10-CM | POA: Diagnosis not present

## 2017-05-12 HISTORY — PX: ABDOMINAL HYSTERECTOMY: SHX81

## 2017-05-12 LAB — CBC WITH DIFFERENTIAL/PLATELET
BASOS ABS: 0 10*3/uL (ref 0.0–0.1)
Band Neutrophils: 0 %
Basophils Relative: 0 %
Blasts: 0 %
Eosinophils Absolute: 0 10*3/uL (ref 0.0–0.7)
Eosinophils Relative: 0 %
HCT: 21.8 % — ABNORMAL LOW (ref 36.0–46.0)
HEMOGLOBIN: 7.7 g/dL — AB (ref 12.0–15.0)
LYMPHS ABS: 1.4 10*3/uL (ref 0.7–4.0)
Lymphocytes Relative: 8 %
MCH: 35.2 pg — AB (ref 26.0–34.0)
MCHC: 35.3 g/dL (ref 30.0–36.0)
MCV: 99.5 fL (ref 78.0–100.0)
METAMYELOCYTES PCT: 0 %
MYELOCYTES: 0 %
Monocytes Absolute: 0.4 10*3/uL (ref 0.1–1.0)
Monocytes Relative: 2 %
NEUTROS PCT: 90 %
NRBC: 0 /100{WBCs}
Neutro Abs: 15.8 10*3/uL — ABNORMAL HIGH (ref 1.7–7.7)
Other: 0 %
PLATELETS: 144 10*3/uL — AB (ref 150–400)
PROMYELOCYTES ABS: 0 %
RBC: 2.19 MIL/uL — AB (ref 3.87–5.11)
RDW: 13 % (ref 11.5–15.5)
WBC: 17.6 10*3/uL — AB (ref 4.0–10.5)

## 2017-05-12 LAB — PREPARE RBC (CROSSMATCH)

## 2017-05-12 SURGERY — Surgical Case
Anesthesia: Spinal | Wound class: Clean Contaminated

## 2017-05-12 MED ORDER — ACETAMINOPHEN 325 MG PO TABS
650.0000 mg | ORAL_TABLET | ORAL | Status: DC | PRN
  Administered 2017-05-12 – 2017-05-15 (×6): 650 mg via ORAL
  Filled 2017-05-12 (×6): qty 2

## 2017-05-12 MED ORDER — PHENYLEPHRINE 8 MG IN D5W 100 ML (0.08MG/ML) PREMIX OPTIME
INJECTION | INTRAVENOUS | Status: AC
  Filled 2017-05-12: qty 100

## 2017-05-12 MED ORDER — MIDAZOLAM HCL 2 MG/2ML IJ SOLN
INTRAMUSCULAR | Status: DC | PRN
  Administered 2017-05-12: 2 mg via INTRAVENOUS

## 2017-05-12 MED ORDER — IBUPROFEN 600 MG PO TABS
600.0000 mg | ORAL_TABLET | Freq: Four times a day (QID) | ORAL | Status: DC
  Administered 2017-05-12 – 2017-05-15 (×11): 600 mg via ORAL
  Filled 2017-05-12 (×11): qty 1

## 2017-05-12 MED ORDER — CEFAZOLIN SODIUM-DEXTROSE 2-4 GM/100ML-% IV SOLN
2.0000 g | INTRAVENOUS | Status: AC
  Administered 2017-05-12: 2 g via INTRAVENOUS
  Filled 2017-05-12: qty 100

## 2017-05-12 MED ORDER — COCONUT OIL OIL: 1.0000 "application " | TOPICAL_OIL | Status: DC | PRN

## 2017-05-12 MED ORDER — BUPIVACAINE HCL (PF) 0.25 % IJ SOLN
INTRAMUSCULAR | Status: AC
  Filled 2017-05-12: qty 30

## 2017-05-12 MED ORDER — FENTANYL CITRATE (PF) 100 MCG/2ML IJ SOLN
INTRAMUSCULAR | Status: AC
  Filled 2017-05-12: qty 2

## 2017-05-12 MED ORDER — DEXAMETHASONE SODIUM PHOSPHATE 10 MG/ML IJ SOLN
INTRAMUSCULAR | Status: DC | PRN
  Administered 2017-05-12: 10 mg via INTRAVENOUS

## 2017-05-12 MED ORDER — SIMETHICONE 80 MG PO CHEW
80.0000 mg | CHEWABLE_TABLET | ORAL | Status: DC | PRN
  Administered 2017-05-13: 80 mg via ORAL
  Filled 2017-05-12 (×2): qty 1

## 2017-05-12 MED ORDER — CHLOROPROCAINE HCL (PF) 3 % IJ SOLN
INTRAMUSCULAR | Status: AC
  Filled 2017-05-12: qty 20

## 2017-05-12 MED ORDER — DIPHENHYDRAMINE HCL 50 MG/ML IJ SOLN
INTRAMUSCULAR | Status: DC | PRN
  Administered 2017-05-12: 12.5 mg via INTRAVENOUS

## 2017-05-12 MED ORDER — PHENYLEPHRINE 8 MG IN D5W 100 ML (0.08MG/ML) PREMIX OPTIME
INJECTION | INTRAVENOUS | Status: DC | PRN
  Administered 2017-05-12: 60 ug/min via INTRAVENOUS

## 2017-05-12 MED ORDER — BUPIVACAINE IN DEXTROSE 0.75-8.25 % IT SOLN
INTRATHECAL | Status: AC
  Filled 2017-05-12: qty 2

## 2017-05-12 MED ORDER — DIPHENHYDRAMINE HCL 50 MG/ML IJ SOLN
INTRAMUSCULAR | Status: AC
  Filled 2017-05-12: qty 1

## 2017-05-12 MED ORDER — LACTATED RINGERS IV SOLN
INTRAVENOUS | Status: DC | PRN
  Administered 2017-05-12 (×4): via INTRAVENOUS

## 2017-05-12 MED ORDER — SIMETHICONE 80 MG PO CHEW
80.0000 mg | CHEWABLE_TABLET | ORAL | Status: DC
  Administered 2017-05-12 – 2017-05-15 (×3): 80 mg via ORAL
  Filled 2017-05-12 (×2): qty 1

## 2017-05-12 MED ORDER — OXYCODONE HCL 5 MG PO TABS
5.0000 mg | ORAL_TABLET | ORAL | Status: DC | PRN
  Administered 2017-05-14 – 2017-05-15 (×2): 5 mg via ORAL
  Filled 2017-05-12 (×2): qty 1

## 2017-05-12 MED ORDER — SCOPOLAMINE 1 MG/3DAYS TD PT72
MEDICATED_PATCH | TRANSDERMAL | Status: DC | PRN
  Administered 2017-05-12: 1 via TRANSDERMAL

## 2017-05-12 MED ORDER — MIDAZOLAM HCL 2 MG/2ML IJ SOLN
INTRAMUSCULAR | Status: AC
  Filled 2017-05-12: qty 2

## 2017-05-12 MED ORDER — MORPHINE SULFATE (PF) 0.5 MG/ML IJ SOLN
INTRAMUSCULAR | Status: DC | PRN
  Administered 2017-05-12: .1 mg via INTRATHECAL

## 2017-05-12 MED ORDER — KETAMINE HCL 10 MG/ML IJ SOLN
INTRAMUSCULAR | Status: DC | PRN
  Administered 2017-05-12 (×4): 10 mg via INTRAVENOUS

## 2017-05-12 MED ORDER — MORPHINE SULFATE (PF) 0.5 MG/ML IJ SOLN
INTRAMUSCULAR | Status: AC
  Filled 2017-05-12: qty 10

## 2017-05-12 MED ORDER — OXYTOCIN 40 UNITS IN LACTATED RINGERS INFUSION - SIMPLE MED
2.5000 [IU]/h | INTRAVENOUS | Status: AC
Start: 2017-05-12 — End: 2017-05-13

## 2017-05-12 MED ORDER — DIBUCAINE 1 % RE OINT: 1.0000 "application " | TOPICAL_OINTMENT | RECTAL | Status: DC | PRN

## 2017-05-12 MED ORDER — BUPIVACAINE HCL (PF) 0.25 % IJ SOLN
INTRAMUSCULAR | Status: AC
  Filled 2017-05-12: qty 10

## 2017-05-12 MED ORDER — BUPIVACAINE HCL (PF) 0.25 % IJ SOLN
INTRAMUSCULAR | Status: DC | PRN
  Administered 2017-05-12: 30 mL

## 2017-05-12 MED ORDER — ZOLPIDEM TARTRATE 5 MG PO TABS: 5.0000 mg | ORAL_TABLET | Freq: Every evening | ORAL | Status: DC | PRN

## 2017-05-12 MED ORDER — FENTANYL CITRATE (PF) 100 MCG/2ML IJ SOLN
INTRAMUSCULAR | Status: DC | PRN
  Administered 2017-05-12: 50 ug via INTRAVENOUS
  Administered 2017-05-12: 25 ug via INTRAVENOUS
  Administered 2017-05-12 (×2): 50 ug via INTRAVENOUS
  Administered 2017-05-12: 25 ug via INTRATHECAL

## 2017-05-12 MED ORDER — MENTHOL 3 MG MT LOZG: 1.0000 | LOZENGE | OROMUCOSAL | Status: DC | PRN

## 2017-05-12 MED ORDER — OXYCODONE HCL 5 MG PO TABS
10.0000 mg | ORAL_TABLET | ORAL | Status: DC | PRN
  Administered 2017-05-13 – 2017-05-15 (×4): 10 mg via ORAL
  Filled 2017-05-12 (×4): qty 2

## 2017-05-12 MED ORDER — SODIUM CHLORIDE 0.9 % IJ SOLN
INTRAMUSCULAR | Status: AC
  Filled 2017-05-12: qty 20

## 2017-05-12 MED ORDER — DEXAMETHASONE SODIUM PHOSPHATE 10 MG/ML IJ SOLN
INTRAMUSCULAR | Status: AC
  Filled 2017-05-12: qty 1

## 2017-05-12 MED ORDER — HYDROMORPHONE HCL 1 MG/ML IJ SOLN: 1.0000 mg | INTRAMUSCULAR | Status: DC | PRN

## 2017-05-12 MED ORDER — PRENATAL MULTIVITAMIN CH
1.0000 | ORAL_TABLET | Freq: Every day | ORAL | Status: DC
  Administered 2017-05-13 – 2017-05-15 (×3): 1 via ORAL
  Filled 2017-05-12 (×3): qty 1

## 2017-05-12 MED ORDER — BUPIVACAINE IN DEXTROSE 0.75-8.25 % IT SOLN
INTRATHECAL | Status: DC | PRN
  Administered 2017-05-12: 1.2 mL via INTRATHECAL

## 2017-05-12 MED ORDER — HYDROMORPHONE HCL 1 MG/ML IJ SOLN: 0.2500 mg | INTRAMUSCULAR | Status: DC | PRN

## 2017-05-12 MED ORDER — OXYTOCIN 10 UNIT/ML IJ SOLN
INTRAVENOUS | Status: DC | PRN
  Administered 2017-05-12: 40 [IU] via INTRAVENOUS

## 2017-05-12 MED ORDER — SENNOSIDES-DOCUSATE SODIUM 8.6-50 MG PO TABS
2.0000 | ORAL_TABLET | ORAL | Status: DC
  Administered 2017-05-12 – 2017-05-15 (×3): 2 via ORAL
  Filled 2017-05-12 (×3): qty 2

## 2017-05-12 MED ORDER — PROMETHAZINE HCL 25 MG/ML IJ SOLN: 12.5000 mg | INTRAMUSCULAR | Status: DC | PRN

## 2017-05-12 MED ORDER — ONDANSETRON HCL 4 MG/2ML IJ SOLN
INTRAMUSCULAR | Status: AC
  Filled 2017-05-12: qty 2

## 2017-05-12 MED ORDER — WITCH HAZEL-GLYCERIN EX PADS: 1.0000 "application " | MEDICATED_PAD | CUTANEOUS | Status: DC | PRN

## 2017-05-12 MED ORDER — KETAMINE HCL 10 MG/ML IJ SOLN
INTRAMUSCULAR | Status: AC
  Filled 2017-05-12: qty 1

## 2017-05-12 MED ORDER — SIMETHICONE 80 MG PO CHEW
80.0000 mg | CHEWABLE_TABLET | Freq: Three times a day (TID) | ORAL | Status: DC
  Administered 2017-05-13 – 2017-05-15 (×3): 80 mg via ORAL
  Filled 2017-05-12 (×7): qty 1

## 2017-05-12 MED ORDER — TETANUS-DIPHTH-ACELL PERTUSSIS 5-2.5-18.5 LF-MCG/0.5 IM SUSP
0.5000 mL | Freq: Once | INTRAMUSCULAR | Status: AC
  Administered 2017-05-15: 0.5 mL via INTRAMUSCULAR
  Filled 2017-05-12: qty 0.5

## 2017-05-12 MED ORDER — SCOPOLAMINE 1 MG/3DAYS TD PT72
MEDICATED_PATCH | TRANSDERMAL | Status: AC
  Filled 2017-05-12: qty 1

## 2017-05-12 MED ORDER — OXYTOCIN 10 UNIT/ML IJ SOLN
INTRAMUSCULAR | Status: AC
  Filled 2017-05-12: qty 4

## 2017-05-12 MED ORDER — DIPHENHYDRAMINE HCL 25 MG PO CAPS: 25.0000 mg | ORAL_CAPSULE | Freq: Four times a day (QID) | ORAL | Status: DC | PRN

## 2017-05-12 MED ORDER — CHLOROPROCAINE HCL (PF) 3 % IJ SOLN
INTRAMUSCULAR | Status: DC | PRN
  Administered 2017-05-12 (×3): 20 mL

## 2017-05-12 MED ORDER — LACTATED RINGERS IV SOLN
INTRAVENOUS | Status: DC | PRN
  Administered 2017-05-12: 16:00:00 via INTRAVENOUS

## 2017-05-12 MED ORDER — LACTATED RINGERS IV SOLN
INTRAVENOUS | Status: DC
  Administered 2017-05-13: 12:00:00 via INTRAVENOUS

## 2017-05-12 MED ORDER — ONDANSETRON HCL 4 MG/2ML IJ SOLN
INTRAMUSCULAR | Status: DC | PRN
  Administered 2017-05-12: 4 mg via INTRAVENOUS

## 2017-05-12 SURGICAL SUPPLY — 49 items
APL SKNCLS STERI-STRIP NONHPOA (GAUZE/BANDAGES/DRESSINGS)
BENZOIN TINCTURE PRP APPL 2/3 (GAUZE/BANDAGES/DRESSINGS) IMPLANT
CLAMP CORD UMBIL (MISCELLANEOUS) IMPLANT
CLIP FILSHIE TUBAL LIGA STRL (Clip) IMPLANT
CLOTH BEACON ORANGE TIMEOUT ST (SAFETY) ×3 IMPLANT
COUNTER NEEDLE 1200 MAGNETIC (NEEDLE) ×1 IMPLANT
DECANTER SPIKE VIAL GLASS SM (MISCELLANEOUS) ×2 IMPLANT
DRESSING DISP NPWT PICO 4X12 (MISCELLANEOUS) ×1 IMPLANT
DRSG OPSITE POSTOP 4X10 (GAUZE/BANDAGES/DRESSINGS) ×3 IMPLANT
DURAPREP 26ML APPLICATOR (WOUND CARE) ×3 IMPLANT
ELECT REM PT RETURN 9FT ADLT (ELECTROSURGICAL) ×3
ELECTRODE REM PT RTRN 9FT ADLT (ELECTROSURGICAL) ×2 IMPLANT
EXTRACTOR VACUUM KIWI (MISCELLANEOUS) IMPLANT
GAUZE SPONGE 4X4 16PLY XRAY LF (GAUZE/BANDAGES/DRESSINGS) ×1 IMPLANT
GLOVE BIO SURGEON ST LM GN SZ9 (GLOVE) ×3 IMPLANT
GLOVE BIOGEL PI IND STRL 7.0 (GLOVE) ×2 IMPLANT
GLOVE BIOGEL PI IND STRL 9 (GLOVE) ×2 IMPLANT
GLOVE BIOGEL PI INDICATOR 7.0 (GLOVE) ×1
GLOVE BIOGEL PI INDICATOR 9 (GLOVE) ×1
GOWN STRL REUS W/TWL 2XL LVL3 (GOWN DISPOSABLE) ×3 IMPLANT
GOWN STRL REUS W/TWL LRG LVL3 (GOWN DISPOSABLE) ×6 IMPLANT
NDL HYPO 25X5/8 SAFETYGLIDE (NEEDLE) IMPLANT
NEEDLE HYPO 22GX1.5 SAFETY (NEEDLE) ×1 IMPLANT
NEEDLE HYPO 25X5/8 SAFETYGLIDE (NEEDLE) IMPLANT
NS IRRIG 1000ML POUR BTL (IV SOLUTION) ×3 IMPLANT
PACK C SECTION WH (CUSTOM PROCEDURE TRAY) ×3 IMPLANT
PAD OB MATERNITY 4.3X12.25 (PERSONAL CARE ITEMS) ×3 IMPLANT
PENCIL SMOKE EVAC W/HOLSTER (ELECTROSURGICAL) ×3 IMPLANT
RETAINER VISCERAL (MISCELLANEOUS) ×1 IMPLANT
RTRCTR C-SECT PINK 25CM LRG (MISCELLANEOUS) IMPLANT
RTRCTR C-SECT PINK 34CM XLRG (MISCELLANEOUS) IMPLANT
SPONGE LAP 18X18 X RAY DECT (DISPOSABLE) ×3 IMPLANT
STRIP CLOSURE SKIN 1/2X4 (GAUZE/BANDAGES/DRESSINGS) IMPLANT
SUT CHROMIC 0 CTX 36 (SUTURE) ×1 IMPLANT
SUT CHROMIC 1 CTX 36 (SUTURE) ×2 IMPLANT
SUT MNCRL 0 VIOLET CTX 36 (SUTURE) ×4 IMPLANT
SUT MONOCRYL 0 CTX 36 (SUTURE) ×2
SUT VIC AB 0 CT1 18XCR BRD8 (SUTURE) IMPLANT
SUT VIC AB 0 CT1 27 (SUTURE) ×6
SUT VIC AB 0 CT1 27XBRD ANBCTR (SUTURE) ×2 IMPLANT
SUT VIC AB 0 CT1 8-18 (SUTURE) ×9
SUT VIC AB 2-0 CT1 27 (SUTURE) ×6
SUT VIC AB 2-0 CT1 TAPERPNT 27 (SUTURE) ×4 IMPLANT
SUT VIC AB 4-0 KS 27 (SUTURE) ×3 IMPLANT
SYR BULB IRRIGATION 50ML (SYRINGE) IMPLANT
SYR CONTROL 10ML LL (SYRINGE) ×1 IMPLANT
TOWEL OR 17X24 6PK STRL BLUE (TOWEL DISPOSABLE) ×3 IMPLANT
TRAY FOLEY BAG SILVER LF 14FR (SET/KITS/TRAYS/PACK) ×3 IMPLANT
WATER STERILE IRR 1000ML POUR (IV SOLUTION) ×1 IMPLANT

## 2017-05-12 NOTE — H&P (Signed)
Maureen Key is a 36 y.o. female presenting for repeat cesarean section and bilateral tubal ligation. The patient has a significant history of previous placenta accreta, with hemorrhagic shock and 22 day hospitalization, but this pregnancy the u/s has been specifically reviewed and there appears to be a normal clear space beneath the placenta to suggest normal placentation. The patient is aware, that if abnormal placental attachment is encountered, that the recommendation will be that we proceed with supracervical hysterectomy rather than risk the significant hemorrhage she experienced before. She agrees with that planning.   There will be 2 IV lines in place, and 2 units PRBC are prepared, as precaution.  The patient desires permanent sterilization , and confirms this desire. The patient has a significant keloid from her prior cesarean, and we will remove it on the way in, as per pt requests.. OB History    Gravida Para Term Preterm AB Living   3 2 1 1   3    SAB TAB Ectopic Multiple Live Births         1 3      Obstetric Comments   Mono/mono twins, scheduled     Past Medical History:  Diagnosis Date  . Blood transfusion without reported diagnosis    last [place] delivery  . Dysmenorrhea   . History of placenta accreta in prior pregnancy, currently pregnant   . History of postpartum hemorrhage, currently pregnant   . History of sepsis   . Pregnancy induced hypertension   . Sickle cell trait ([place])   . Trichimoniasis   . Vaginal Pap smear, abnormal    Past Surgical History:  Procedure Laterality Date  . CERVICAL CONE BIOPSY    . CESAREAN SECTION     Family History: family history includes Alzheimer's disease in her paternal grandmother; Cancer in her father, paternal aunt, and paternal uncle; Diabetes in her maternal aunt, maternal grandmother, maternal uncle, and sister; HIV/AIDS in her father; Hypertension in her mother. Social History:  reports that she quit smoking about 2  months ago. She smoked 0.25 packs per day. She has never used smokeless tobacco. She reports that she uses drugs, including Marijuana. She reports that she does not drink alcohol.     Maternal Diabetes: No Genetic Screening: Normal Maternal Ultrasounds/Referrals: Normal Fetal Ultrasounds or other Referrals:  None Maternal Substance Abuse:  Yes:  Type: Marijuana + uds in 2018 Significant Maternal Medications:  None Significant Maternal Lab Results:  Lab values include: Group B Strep negative Other Comments:  .cbc CBC    Component Value Date/Time   WBC 5.7 05/11/2017 0920   RBC 3.32 (L) 05/11/2017 0920   HGB 11.3 (L) 05/11/2017 0920   HGB 10.4 (L) 04/20/2017 0945   HGB 12.2 09/21/2016   HCT 32.3 (L) 05/11/2017 0920   HCT 30.6 (L) 04/20/2017 0945   HCT 35 09/21/2016   PLT 138 (L) 05/11/2017 0920   PLT 153 04/20/2017 0945   PLT 174 09/21/2016   MCV 97.3 05/11/2017 0920   MCV 101 (H) 04/20/2017 0945   MCH 34.0 05/11/2017 0920   MCHC 35.0 05/11/2017 0920   RDW 13.0 05/11/2017 0920   RDW 13.1 04/20/2017 0945   LYMPHSABS 0.8 12/17/2012 1749   MONOABS 0.4 12/17/2012 1749   EOSABS 0.0 12/17/2012 1749   BASOSABS 0.0 12/17/2012 1749    ROS History   Blood pressure 122/80, pulse 97, temperature 98 F (36.7 C), temperature source Oral, resp. rate 20, height 5\' 1"  (1.549 m), weight 135 lb (  61.2 kg), last menstrual period 08/05/2016. Exam Physical Exam  Constitutional: She appears well-developed and well-nourished.  HENT:  Head: Normocephalic.  Eyes: Pupils are equal, round, and reactive to light. EOM are normal.  Neck: Neck supple. Thyromegaly present.  Cardiovascular: Normal rate.   Respiratory: Effort normal. No respiratory distress. She exhibits no tenderness.  GI: Soft.  Pfannenstiel incison location transverse with large 9 mm keloid strip   gravid uterus , now breech. S/p failed ECV Prenatal labs: ABO, Rh: --/--/AB POS, AB POS (11/01 0920) Antibody: NEG (11/01  0920) Rubella: Immune, Immune (03/14 0000) RPR: Non Reactive (08/23 0900)  HBsAg: Negative, Negative (03/14 0000)  HIV: Non-reactive, Non-reactive (03/14 0000)  GBS:     Assessment/Plan: Pregnancy [redacted]w[redacted]d Prior cesarean for repeat Desire for permanent sterilization , consented last month Hx abnormal placental attachment, with normal appearances to recent u/s this pregnancy.   Maureen Key 05/12/2017, 2:53 PM

## 2017-05-12 NOTE — Anesthesia Procedure Notes (Signed)
Spinal  Patient location during procedure: OR Staffing Anesthesiologist: Lyndle Herrlich Spinal Block Patient position: sitting Prep: DuraPrep Patient monitoring: heart rate, blood pressure and continuous pulse ox Approach: right paramedian Location: L3-4 Injection technique: single-shot Needle Needle type: Sprotte  Needle gauge: 24 G Needle length: 9 cm Assessment Sensory level: T4 Additional Notes Spinal Dosage in OR  .75% Bupivicaine ml       1.2     [id number]   mcg        100    Fentanyl mcg            25

## 2017-05-12 NOTE — Op Note (Signed)
05/12/2017  5:49 PM  PATIENT:  Maureen Key  36 y.o. female  PRE-OPERATIVE DIAGNOSIS:  Cesearean section with BTL, previous cesarean section not for trial of labor,breach presentation, history of placenta accreta  POST-OPERATIVE DIAGNOSIS:  Pregnancy 39 weeks delivered, placenta accreta, previous cesarean section not for trial of labor, breech presentation,  PROCEDURE:  Procedure(s): CESAREAN SECTION WITH Hysterectomy (N/A) HYSTERECTOMY ABDOMINAL  SURGEON:  Surgeon(s) and Role:    * Glo Herring, Angelyn Punt, MD - Primary    * Katheren Shams, DO - Assisting    * Woodroe Mode, MD - Assisting  PHYSICIAN ASSISTANT: Idelia Salm M.D.  ASSISTANTS: none   ANESTHESIA:   local and spinal  EBL:  1095 mL   BLOOD ADMINISTERED:none  DRAINS: Urinary Catheter (Foley)   LOCAL MEDICATIONS USED:  MARCAINE   , Amount: 20 ml and OTHER Nesacaine 60 mL's intraperitoneal, then additional 30 mL's intraperitoneal  SPECIMEN:  Source of Specimen:  Uterus with placenta attached  DISPOSITION OF SPECIMEN:  PATHOLOGY  COUNTS:  YES  TOURNIQUET:  * No tourniquets in log *  DICTATION: .Dragon Dictation  PLAN OF CARE: Admit to inpatient   PATIENT DISPOSITION:  PACU - hemodynamically stable.   Delay start of Pharmacological VTE agent (>24hrs) due to surgical blood loss or risk of bleeding: not applicable Indications: 36 year old female with prior history of placenta accreta, with breech presentation requesting repeat cesarean section. Placenta was posterior and fundal and had ultrasounds which showed at least some clear space behind the placental bed indicating that the placenta would likely be removable. Cesarean section for breech was performed and when he came time to remove the placenta only the left cornual area of the placental attachment would adequately detached. The posterior portion of the placenta was densely adherent as confirmed by 2's experience surgeons and decision for cesarean  hysterectomy made and subsequently performed.  Details of procedure: Patient was taken to the operating room prepped and draped for lower abdominal surgery. She spinal anesthesia introduced. Due to the history tubes IV lines access were oriented place and type and cross 2 units. Answers lower abdominal incision was performed with excision of the old cicatrix with easy entry of the abdominal cavity through standard Pfannenstiel surgical technique. Bladder [place] on the anterior abdominal wall and could be decompressed in and was then at well out of the way using the Alexis wound retractor. Transverse lower uterine segment incision was made along the lines of the old prior cesarean hysterotomy line extended using cephalad and caudad traction and the fetal buttocks delivered through the incision and the baby delivered in standard technique flexing the knees before delivering the feet, sweeping the arms and from the head and then guiding the fetal vertex well placing a finger in the mouth to allow the baby to be extracted safely and cautiously, with fundal pressure by the assistant providing the primary propulsive forceps Cord was clamped at 1 minute and the baby passed to the waiting pediatricians. Post Pitocin was administered. We were encouraging uterine tone by uterine massage. After several minutes with no significant separation of the placenta manual removal of the placenta was considered and I gently began to dissect cleavage plane beneath the placenta beginning at the fundal portion of the uterus near the left uterine cornual area. Approximately a third of the placenta could be separated without too much difficulty but the middle posterior portion of the placenta would absolutely not be removed. Was approached from several edges and no success  finding a satisfactory cleavage plane was identified. Approximately 15 minutes of effort we decided that cesarean hysterectomy was indicated. Dr. Roselie Awkward was called  in the room for confirmatory exam and he attempted to remove the placenta with similar impression. At this time is was closed with a single layer running locking closure incision leaving the placenta in situ. We began noticed that there left side the round ligament was somewhat lower in his position. On the left side we were able to grasp the left fallopian tube proximally and the utero-ovarian ligament and a single Kelly clamp, section Kelley clamp placed for backbleeding clamping cutting and transecting these pedicles with good hemostasis. Ligament was taken down on the left. Vessels on the left were then identified and a curved Heaney clamp placed across with a Kelley clamp placed from back bleeding. The uterine vessels were then clamped cut and suture ligated rather high on the side of the uterus. Attention was then directed to the patient's right side where the anatomy was more easily identified and the round ligament could be taken down doubly clamped cut and suture ligated, followed by isolation of the right utero-ovarian ligament and right fallopian tube which were then doubly clamped transected and the lateral ligature doubly tied down with 0 Vicryl pop-off. The uterine vessels on this side were easily identified and doubly clamped with curved Heaney clamps with a Kelley clamp placed for backbleeding, transected and ligated with 0 Vicryl pop offs a straight Heaney clamp was used on the right side for a distance reaching approximately 1/2 cm below the old hysterotomy scar line, clamping cutting and transecting the uterine vessels. A Coker clamp was used to control backbleeding in this area. At this time we are possible 1.5 cm below the old hysterotomy scar on both sides curved Heaney clamp was placed across the upper portion of the cyst cervix, lower uterine segment and the uterine body amputated off of the specimen. He can see the edge of the placenta in the specimen side of things. A 0 Vicryl ligature was  placed at each uterine angle beneath these curved Heaney clamps. This left approximately 3-1/2 cm area in the middle thick be open with ring clamp placed on each of the front and back side of the cervical stump. The cervix was explored and ring forceps used to extract a small amount of membrane remnants from this part of the cervical stump. Digital inspection of this area felt like the area was clear of any debris. The could feel the cervix at a distance of approximately 4 cm beneath the stump. The cervical stump was then closed by a running locking 0 Vicryl first layer closure and oversewn with a continuous running 0 Vicryl second layer Pedicles were inspected and one additional suture placed on the right uterine vessels with 0 Vicryl suture ligature. The the pedicles on the broad ligament remnants on each side were inspected and each one required 1 single additional suture of where a Georgina Peer Could be placed and a superficial ligature placed on the edges  of the broad ligament. At this point hemostasis considered excellent.. During the case on several occasions patient would experience discomfort as we move the uterus or the bowel Marcaine was injected into the fascia prior to closure the fascia. During the case we densely use Nesacaine 3 times, 30 cc's poured into the abdominal cavity to aid with discomfort. Once hemostasis was complete, sponge and needle counts correct, the peritoneum was closed with some difficulty as the patient  tended to expel the bowel into the surgical field. Using a fish flexible retractor were able to gently eased the bowel into the abdominal cavity and closed the peritoneum with running 2-0 Vicryl. This point the patient was quite stable, fascia was then closed with running 0 Vicryl beginning at each side and sewing to the midline. Subcutaneous tissues were irrigated confirmed as hemostatic, reapproximated with 3 interrupted horizontal mattress sutures of 2-0 Vicryl followed by  subcuticular 4-0 Vicryl closure the skin sponge and needle counts completed the procedure,

## 2017-05-12 NOTE — Brief Op Note (Signed)
05/12/2017  5:49 PM  PATIENT:  Maureen Key  36 y.o. female  PRE-OPERATIVE DIAGNOSIS:  Cesearean section with BTL, previous cesarean section not for trial of labor,breach presentation, history of placenta accreta  POST-OPERATIVE DIAGNOSIS:  Pregnancy 39 weeks delivered, placenta accreta, previous cesarean section not for trial of labor, breech presentation,  PROCEDURE:  Procedure(s): CESAREAN SECTION WITH Hysterectomy (N/A) HYSTERECTOMY ABDOMINAL  SURGEON:  Surgeon(s) and Role:    * Glo Herring, Angelyn Punt, MD - Primary    * Katheren Shams, DO - Assisting    * Woodroe Mode, MD - Assisting  PHYSICIAN ASSISTANT: Idelia Salm M.D.  ASSISTANTS: none   ANESTHESIA:   local and spinal  EBL:  1095 mL   BLOOD ADMINISTERED:none  DRAINS: Urinary Catheter (Foley)   LOCAL MEDICATIONS USED:  MARCAINE   , Amount: 20 ml and OTHER Nesacaine 60 mL's intraperitoneal, then additional 30 mL's intraperitoneal  SPECIMEN:  Source of Specimen:  Uterus with placenta attached  DISPOSITION OF SPECIMEN:  PATHOLOGY  COUNTS:  YES  TOURNIQUET:  * No tourniquets in log *  DICTATION: .Dragon Dictation  PLAN OF CARE: Admit to inpatient   PATIENT DISPOSITION:  PACU - hemodynamically stable.   Delay start of Pharmacological VTE agent (>24hrs) due to surgical blood loss or risk of bleeding: not applicable

## 2017-05-12 NOTE — Transfer of Care (Signed)
Immediate Anesthesia Transfer of Care Note  Patient: Maureen Key  Procedure(s) Performed: CESAREAN SECTION WITH Hysterectomy (N/A ) HYSTERECTOMY ABDOMINAL  Patient Location: PACU  Anesthesia Type:Spinal  Level of Consciousness: awake, alert  and oriented  Airway & Oxygen Therapy: Patient Spontanous Breathing  Post-op Assessment: Report given to RN and Post -op Vital signs reviewed and stable  Post vital signs: Reviewed and stable  Last Vitals:  Vitals:   05/12/17 1314  BP: 122/80  Pulse: 97  Resp: 20  Temp: 36.7 C    Last Pain:  Vitals:   05/12/17 1314  TempSrc: Oral         Complications: No apparent anesthesia complications

## 2017-05-12 NOTE — Anesthesia Preprocedure Evaluation (Signed)
Anesthesia Evaluation  Patient identified by MRN, date of birth, ID band Patient awake    Reviewed: Allergy & Precautions, H&P , Patient's Chart, lab work & pertinent test results, reviewed documented beta blocker date and time   Airway Mallampati: II  TM Distance: >3 FB Neck ROM: full    Dental no notable dental hx.    Pulmonary former smoker,    Pulmonary exam normal breath sounds clear to auscultation       Cardiovascular hypertension,  Rhythm:regular Rate:Normal     Neuro/Psych    GI/Hepatic   Endo/Other    Renal/GU      Musculoskeletal   Abdominal   Peds  Hematology   Anesthesia Other Findings   Reproductive/Obstetrics                             Anesthesia Physical Anesthesia Plan  ASA: II  Anesthesia Plan: Spinal   Post-op Pain Management:    Induction:   PONV Risk Score and Plan:   Airway Management Planned:   Additional Equipment:   Intra-op Plan:   Post-operative Plan:   Informed Consent: I have reviewed the patients History and Physical, chart, labs and discussed the procedure including the risks, benefits and alternatives for the proposed anesthesia with the patient or authorized representative who has indicated his/her understanding and acceptance.   Dental Advisory Given  Plan Discussed with: CRNA and Surgeon  Anesthesia Plan Comments: (  )        Anesthesia Quick Evaluation

## 2017-05-12 NOTE — Anesthesia Postprocedure Evaluation (Signed)
Anesthesia Post Note  Patient: Maureen Key  Procedure(s) Performed: CESAREAN SECTION WITH Hysterectomy (N/A ) HYSTERECTOMY ABDOMINAL     Patient location during evaluation: PACU Anesthesia Type: Spinal Level of consciousness: oriented and awake and alert Pain management: pain level controlled Vital Signs Assessment: post-procedure vital signs reviewed and stable Respiratory status: spontaneous breathing and respiratory function stable Cardiovascular status: blood pressure returned to baseline and stable Postop Assessment: no headache, no backache and no apparent nausea or vomiting Anesthetic complications: no    Last Vitals:  Vitals:   05/12/17 1850 05/12/17 1853  BP:    Pulse: (!) 110 (!) 111  Resp: 14 17  Temp:    SpO2: 99% 100%    Last Pain:  Vitals:   05/12/17 1845  TempSrc: Axillary   Pain Goal:                 Lynda Rainwater

## 2017-05-13 LAB — BASIC METABOLIC PANEL
ANION GAP: 6 (ref 5–15)
BUN: 13 mg/dL (ref 6–20)
CHLORIDE: 101 mmol/L (ref 101–111)
CO2: 24 mmol/L (ref 22–32)
CREATININE: 0.67 mg/dL (ref 0.44–1.00)
Calcium: 8.1 mg/dL — ABNORMAL LOW (ref 8.9–10.3)
GFR calc non Af Amer: >60 mL/min (ref >60)
Glucose, Bld: 89 mg/dL (ref 65–99)
POTASSIUM: 4.1 mmol/L (ref 3.5–5.1)
SODIUM: 131 mmol/L — AB (ref 135–145)

## 2017-05-13 LAB — CBC
HEMATOCRIT: 15.4 % — AB (ref 36.0–46.0)
HEMOGLOBIN: 5.4 g/dL — AB (ref 12.0–15.0)
MCH: 34.2 pg — ABNORMAL HIGH (ref 26.0–34.0)
MCHC: 35.1 g/dL (ref 30.0–36.0)
MCV: 97.5 fL (ref 78.0–100.0)
Platelets: 132 10*3/uL — ABNORMAL LOW (ref 150–400)
RBC: 1.58 MIL/uL — AB (ref 3.87–5.11)
RDW: 13.4 % (ref 11.5–15.5)
WBC: 9.2 10*3/uL (ref 4.0–10.5)

## 2017-05-13 LAB — PREPARE RBC (CROSSMATCH)

## 2017-05-13 MED ORDER — CEFAZOLIN SODIUM-DEXTROSE 2-4 GM/100ML-% IV SOLN
2.0000 g | Freq: Once | INTRAVENOUS | Status: AC
  Administered 2017-05-13: 2 g via INTRAVENOUS
  Filled 2017-05-13 (×2): qty 100

## 2017-05-13 MED ORDER — SODIUM CHLORIDE 0.9 % IV SOLN
Freq: Once | INTRAVENOUS | Status: AC
  Administered 2017-05-13: 08:00:00 via INTRAVENOUS

## 2017-05-13 NOTE — Addendum Note (Signed)
Addendum  created 05/13/17 0913 by Flossie Dibble, CRNA   Sign clinical note

## 2017-05-13 NOTE — Progress Notes (Signed)
Faculty Attending Note  Post Partum Day 1 s/p RCS+c-hyst  Subjective: Patient is feeling okay, she is tired and hasn't slept much. Reports cramping and discomfort in abdomen. Has been out of bed once, foley remains in place. Has not passed gas, denies nausea/vomiting.   Objective: Blood pressure 99/63, pulse 97, temperature 98.5 F (36.9 C), temperature source Oral, resp. rate 20, height 5\' 1"  (1.549 m), weight 135 lb (61.2 kg), last menstrual period 08/05/2016, SpO2 97 %, unknown if currently breastfeeding.  Physical Exam:  General: alert, cooperative, mild distress, pale and fatigued Lochia: appropriate Chest: CTAB Heart: RRR no m/r/g Abdomen: +BS, soft, appropriately tender, dressing soaked and removed this am, incision c/d/i with no active bleeding noted  Uterine Fundus: not present DVT Evaluation: No evidence of DVT seen on physical exam. Extremities: without edema or swelling  UOP: 700 mL overnight, 500 mL over last two hours, clear  Recent Labs  05/12/17 2044 05/13/17 0558  HGB 7.7* 5.4*  HCT 21.8* 15.4*    Assessment/Plan: POD#1 s/p c-hyst, suspected accreta Patient fatigued but asymptomatic with significant anemia. Reviewed recommendation for blood transfusion given acute drop to 5/15 despite her lack of symptoms. Reviewed risks/benefits of blood transfusion including quicker recovery, cardiovascular risks, transfusion reaction, transmission of disease. Patient verbalizes understanding and is agreeable to transfusion. She is ordered for 3 u pRBCs.  3 units pRBCs Monitor dressing Regular diet Maintain foley for now LR until tolerating PO well   LOS: 1 day   Sloan Leiter 05/13/2017, 8:07 AM

## 2017-05-13 NOTE — Anesthesia Postprocedure Evaluation (Signed)
Anesthesia Post Note  Patient: Maureen Key  Procedure(s) Performed: CESAREAN SECTION WITH Hysterectomy (N/A ) HYSTERECTOMY ABDOMINAL     Patient location during evaluation: Mother Baby Anesthesia Type: Spinal Level of consciousness: awake and alert and oriented Pain management: satisfactory to patient Vital Signs Assessment: post-procedure vital signs reviewed and stable Respiratory status: respiratory function stable and spontaneous breathing Cardiovascular status: blood pressure returned to baseline Postop Assessment: no headache, no backache, spinal receding, patient able to bend at knees and adequate PO intake Anesthetic complications: no    Last Vitals:  Vitals:   05/13/17 0819 05/13/17 0857  BP: (!) 97/56 121/63  Pulse: 77 82  Resp: 16 16  Temp: 36.9 C 36.9 C  SpO2: 100%     Last Pain:  Vitals:   05/13/17 0857  TempSrc: Oral  PainSc:    Pain Goal:                 Zhyon Antenucci

## 2017-05-13 NOTE — Progress Notes (Signed)
Patient ID: Maureen Key, female   DOB: 1981-03-25, 36 y.o.   MRN: [id number] [facility] Attending  Late Entry Pt seen @ 4 PM  Pt reports feeling much better. Has received 2 of 3 units PRBC's. Pain controlled. No vaginal bleeding VSS Good UOP  Complete transfusion. Continue with supportive care.

## 2017-05-14 ENCOUNTER — Other Ambulatory Visit: Payer: Self-pay

## 2017-05-14 LAB — BPAM RBC
BLOOD PRODUCT EXPIRATION DATE: 201811162359
BLOOD PRODUCT EXPIRATION DATE: 201811162359
BLOOD PRODUCT EXPIRATION DATE: 201811302359
Blood Product Expiration Date: 201811102359
Blood Product Expiration Date: 201811302359
ISSUE DATE / TIME: 201811030855
ISSUE DATE / TIME: 201811031355
ISSUE DATE / TIME: 201811031706
UNIT TYPE AND RH: 5100
UNIT TYPE AND RH: 6200
UNIT TYPE AND RH: 9500
Unit Type and Rh: 5100
Unit Type and Rh: 6200

## 2017-05-14 LAB — TYPE AND SCREEN
ABO/RH(D): AB POS
ANTIBODY SCREEN: NEGATIVE
UNIT DIVISION: 0
UNIT DIVISION: 0
Unit division: 0
Unit division: 0
Unit division: 0

## 2017-05-14 LAB — CBC
HEMATOCRIT: 26.2 % — AB (ref 36.0–46.0)
HEMOGLOBIN: 9.5 g/dL — AB (ref 12.0–15.0)
MCH: 32.5 pg (ref 26.0–34.0)
MCHC: 36.3 g/dL — ABNORMAL HIGH (ref 30.0–36.0)
MCV: 89.7 fL (ref 78.0–100.0)
Platelets: 121 10*3/uL — ABNORMAL LOW (ref 150–400)
RBC: 2.92 MIL/uL — ABNORMAL LOW (ref 3.87–5.11)
RDW: 16.2 % — ABNORMAL HIGH (ref 11.5–15.5)
WBC: 7.6 10*3/uL (ref 4.0–10.5)

## 2017-05-14 NOTE — Progress Notes (Signed)
Subjective: Postpartum Day 2: Cesarean Delivery with supracervical hysterectomy due to placenta Accreta Patient reports incisional pain, + flatus and no problems voiding.  Pain is minimal except when she gets up, and has lots of pressure Sx. Generous flatus began last night. Had presumed intraabdominal postop bleeding sufficient to reduce Hgb to 5.4, received 3 units PRBC with appropriate rise in HGB.  Objective: Vital signs in last 24 hours: Temp:  [97.8 F (36.6 C)-98.7 F (37.1 C)] 98.3 F (36.8 C) (11/04 0539) Pulse Rate:  [77-101] 82 (11/04 0539) Resp:  [16-20] 18 (11/04 0539) BP: (97-140)/(56-89) 122/78 (11/04 0539) SpO2:  [100 %] 100 % (11/03 1607)  Intake/Output Summary (Last 24 hours) at 05/14/2017 0750 Last data filed at 05/13/2017 2000 Gross per 24 hour  Intake 2799.5 ml  Output 2950 ml  Net -150.5 ml    Physical Exam:  General: alert, cooperative and no distress  Skin warm dry . Capillary refil 1 sec. Lochia: appropriate Uterine Fundus: absent  GI: hypoactive bowel sounds, abd soft, no guarding or rebound Incision: no significant drainage DVT Evaluation: No evidence of DVT seen on physical exam.  Recent Labs    05/13/17 0558 05/14/17 0501  HGB 5.4* 9.5*  HCT 15.4* 26.2*   CBC Latest Ref Rng & Units 05/14/2017 05/13/2017 05/12/2017  WBC 4.0 - 10.5 K/uL 7.6 9.2 17.6(H)  Hemoglobin 12.0 - 15.0 g/dL 9.5(L) 5.4(LL) 7.7(L)  Hematocrit 36.0 - 46.0 % 26.2(L) 15.4(L) 21.8(L)  Platelets 150 - 400 K/uL 121(L) 132(L) 144(L)     Assessment/Plan: Status post Cesarean section. Postoperative course complicated by placenta accreta, supracervical hysterectomy , postop anemia, now stable  After 3 units prbc Continue current care. Keep one more day, d/c Monday. Kaneesha Constantino V 05/14/2017, 7:46 AM

## 2017-05-15 ENCOUNTER — Encounter: Payer: Medicaid Other | Admitting: Women's Health

## 2017-05-15 ENCOUNTER — Encounter (HOSPITAL_COMMUNITY): Payer: Self-pay | Admitting: *Deleted

## 2017-05-15 MED ORDER — IBUPROFEN 600 MG PO TABS: 600.0000 mg | ORAL_TABLET | Freq: Four times a day (QID) | ORAL | 1 refills | Status: DC | PRN

## 2017-05-15 MED ORDER — SENNOSIDES-DOCUSATE SODIUM 8.6-50 MG PO TABS: 2.0000 | ORAL_TABLET | ORAL | 0 refills | Status: DC

## 2017-05-15 MED ORDER — OXYCODONE HCL 10 MG PO TABS: 10.0000 mg | ORAL_TABLET | Freq: Four times a day (QID) | ORAL | 0 refills | Status: DC | PRN

## 2017-05-15 NOTE — Discharge Summary (Addendum)
OB Discharge Summary     Patient Name: Maureen Key DOB: [birth year] MRN: [id number]  Date of admission: 05/12/2017 Delivering MD:     Date of discharge: 05/15/2017  Admitting diagnosis: Cesearean section with BTL Intrauterine pregnancy: [redacted]w[redacted]d     Secondary diagnosis:  Active Problems:   Fetal malpresentation breech   Status post emergency cesarean hysterectomy  Additional problems: Hx of preterm delivery, trichimoniasis, Hx of c-section and IUGR, Hx of placenta accreta in prior pregnancy, Hx of acute resp failure, Hx of postpartum hemorrhage     Discharge diagnosis: Term Pregnancy Delivered                                                                                                Post partum procedures:blood transfusion  Augmentation: Pitocin and Cytotec  Complications: postpartum hemorrhage with EBL of 873mL, patient had hg postpatrum of 5.2 and was transfused 3units, Hg increased to 9.2.  Hospital course:  Onset of Labor With Unplanned C/S  36 y.o. yo [id number] at [redacted]w[redacted]d was admitted in Latent Labor on 05/12/2017. Patient had a labor course significant for fetal malpresentation breech. Membrane Rupture Time/Date:   ,    The patient went for cesarean section due to Malpresentation and Non-Reassuring FHR, and delivered a Viable infant,05/12/2017  Details of operation can be found in separate operative note. Patient had an complicated postpartum course, and that she had a slow decline of the hemoglobin overnight to 5.4 received 3 units packed cells with stable hematocrit. She was never unstable..  She is ambulating,tolerating a regular diet, passing flatus, and urinating well.  Patient is discharged home in stable condition 05/15/17.  Physical exam  Vitals:   05/13/17 2015 05/14/17 0539 05/14/17 1724 05/15/17 0535  BP: 140/84 122/78 106/72 122/74  Pulse: 87 82 76 66  Resp: 18 18 20 16   Temp: 98.2 F (36.8 C) 98.3 F (36.8 C) 98.4 F (36.9 C) 97.7 F (36.5 C)   TempSrc: Oral Oral Oral Oral  SpO2:      Weight:      Height:       General: alert, cooperative and no distress Lochia: appropriate Uterine Fundus: firm Incision: Healing well with no significant drainage, No significant erythema, Dressing is clean, dry, and intact DVT Evaluation: No evidence of DVT seen on physical exam. No cords or calf tenderness. Labs: Lab Results  Component Value Date   WBC 7.6 05/14/2017   HGB 9.5 (L) 05/14/2017   HCT 26.2 (L) 05/14/2017   MCV 89.7 05/14/2017   PLT 121 (L) 05/14/2017   CMP Latest Ref Rng & Units 05/13/2017  Glucose 65 - 99 mg/dL 89  BUN 6 - 20 mg/dL 13  Creatinine 0.44 - 1.00 mg/dL 0.67  Sodium 135 - 145 mmol/L 131(L)  Potassium 3.5 - 5.1 mmol/L 4.1  Chloride 101 - 111 mmol/L 101  CO2 22 - 32 mmol/L 24  Calcium 8.9 - 10.3 mg/dL 8.1(L)  Total Protein 6.5 - 8.1 g/dL -  Total Bilirubin 0.3 - 1.2 mg/dL -  Alkaline Phos 38 - 126 U/L -  AST 15 - 41  U/L -  ALT 14 - 54 U/L -    Discharge instruction: per After Visit Summary and "Baby and Me Booklet".  After visit meds:  Allergies as of 05/15/2017   No Known Allergies     Medication List    TAKE these medications   butalbital-acetaminophen-caffeine 50-325-40 MG tablet Commonly known as:  FIORICET, ESGIC Take 1 tablet by mouth every 6 (six) hours as needed for headache. What changed:  reasons to take this   Fish Oil 1000 MG Caps Take 1,000 mg by mouth daily.   ibuprofen 600 MG tablet Commonly known as:  ADVIL,MOTRIN Take 1 tablet (600 mg total) every 6 (six) hours as needed by mouth.   multivitamin-prenatal 27-0.8 MG Tabs tablet Take 1 tablet by mouth daily at 12 noon.   NIFEdipine 30 MG 24 hr tablet Commonly known as:  PROCARDIA-XL/ADALAT-CC/NIFEDICAL-XL Take 1 tablet (30 mg total) by mouth daily.   Oxycodone HCl 10 MG Tabs Take 1 tablet (10 mg total) every 6 (six) hours as needed by mouth (pain scale > 7).   senna-docusate 8.6-50 MG tablet Commonly known as:   Senokot-S Take 2 tablets daily by mouth. Start taking on:  05/16/2017            Discharge Care Instructions  (From admission, onward)        Start     Ordered   05/15/17 0000  Change dressing (specify)    Comments:  Dressing change: You may shower with the dressing on. Please leave in place for 5-7 days. It will start to come off on its on. If it is still in place after 7 days you may remove all dressing and strips.   05/15/17 1042      Diet: routine diet  Activity: Advance as tolerated. Pelvic rest for 6 weeks.   Outpatient follow up:1 week Follow up Appt:No future appointments. Follow up Visit:No Follow-up on file.  Postpartum contraception: s/p hysterectomy  Newborn Data: Live born female  Birth Weight: 5 lb 8.5 oz (2510 g) APGAR: 6, 7  Newborn Delivery   Birth date/time:  05/12/2017 15:48:00 Delivery type:       Baby Feeding: Breast Disposition:home with mother   05/15/2017 Nuala Alpha, DO

## 2017-05-15 NOTE — Discharge Instructions (Signed)

## 2017-05-16 ENCOUNTER — Telehealth: Payer: Self-pay | Admitting: *Deleted

## 2017-05-16 ENCOUNTER — Encounter (HOSPITAL_COMMUNITY): Payer: Self-pay | Admitting: Obstetrics and Gynecology

## 2017-05-16 LAB — RPR: RPR Ser Ql: NONREACTIVE

## 2017-05-16 NOTE — Telephone Encounter (Signed)
Brandy called from [facility] to report Hgb of 8.7. Pt is still taking PNV and iron supplement. Bleeding is normal. Appointment next Monday. Will check in office then

## 2017-05-22 ENCOUNTER — Ambulatory Visit (INDEPENDENT_AMBULATORY_CARE_PROVIDER_SITE_OTHER): Payer: Medicaid Other | Admitting: Obstetrics and Gynecology

## 2017-05-22 ENCOUNTER — Encounter: Payer: Self-pay | Admitting: Obstetrics and Gynecology

## 2017-05-22 VITALS — BP 122/74 | HR 84 | Ht 62.0 in | Wt 118.6 lb

## 2017-05-22 DIAGNOSIS — Z4889 Encounter for other specified surgical aftercare: Secondary | ICD-10-CM

## 2017-05-22 DIAGNOSIS — Z9889 Other specified postprocedural states: Secondary | ICD-10-CM

## 2017-05-22 NOTE — Progress Notes (Signed)
   Subjective:  Maureen Key is a 36 y.o. female now 10 days status post repeat C-section with abdominalsupracervical hysterectomy. Done at the time of the plannedrepeat cesarean. Recover included 3 units packed cells transfusion on postop day 1    Review of Systems Negative   Diet:   normal   Bowel movements : normal.  Pain is controlled with current analgesics. Medications being used: ibuprofen (OTC).  Objective:  LMP 08/05/2016  General:Well developed, well nourished.  No acute distress. Abdomen: Bowel sounds normal, soft, non-tender. Pelvic Exam:not indicated  Incision(s):   Healing well, no drainage, no erythema, no hernia, no swelling, no dehiscence,     Assessment:  Post-Op 10 days status post C-section with abdominal hysterectomy.   Doing well postoperatively. Some puffiness around incision   Plan:  1.Wound care discussed  2. . current medications. ibuprofen 3. Activity restrictions: none 4. return to work: not applicable. 5. Follow up in 4 weeks.    By signing my name below, I, Izna Ahmed, attest that this documentation has been prepared under the direction and in the presence of Jonnie Kind, MD. Electronically Signed: Jabier Gauss, Medical Scribe. 05/22/17. 1:38 PM.  I personally performed the services described in this documentation, which was SCRIBED in my presence. The recorded information has been reviewed and considered accurate. It has been edited as necessary during review. Jonnie Kind, MD

## 2017-06-27 ENCOUNTER — Ambulatory Visit (INDEPENDENT_AMBULATORY_CARE_PROVIDER_SITE_OTHER): Payer: Medicaid Other | Admitting: Advanced Practice Midwife

## 2017-06-27 ENCOUNTER — Encounter: Payer: Self-pay | Admitting: Advanced Practice Midwife

## 2017-06-27 DIAGNOSIS — Z90711 Acquired absence of uterus with remaining cervical stump: Secondary | ICD-10-CM | POA: Insufficient documentation

## 2017-06-27 MED ORDER — MEGESTROL ACETATE 400 MG/10ML PO SUSP: 800.0000 mg | Freq: Every day | ORAL | 6 refills | Status: DC

## 2017-06-27 NOTE — Progress Notes (Signed)
Maureen Key is a 36 y.o. who presents for a postpartum visit. She is 6 weeks postpartum following a low cervical transverse Cesarean section.w/abdominal supracervical hysterectomy.  CS was d/t breech, NRFHT; Hyst d/t placenta acreta.  I have fully reviewed the prenatal and intrapartum course. The delivery was at 60 gestational weeks.  Anesthesia: spinal. Had a blood transfusion;  Postpartum course has been uneventrul. Baby's course has been uneventful. Baby is feeding by bottle. Bleeding: . Bowel function is normal. Bladder function is normal. Patient is sexually active. Contraception method is hysterectomy. Postpartum depression screening: negative. Has not appetite, feels insecure because of her weight.  BMI 21.  Not unhealthy.  Ttry ensure/boost.     Current Outpatient Medications:  .  Prenatal Vit-Fe Fumarate-FA (MULTIVITAMIN-PRENATAL) 27-0.8 MG TABS tablet, Take 1 tablet by mouth daily at 12 noon., Disp: 30 each, Rfl: 11 .  butalbital-acetaminophen-caffeine (FIORICET, ESGIC) 50-325-40 MG tablet, Take 1 tablet by mouth every 6 (six) hours as needed for headache. (Patient not taking: Reported on 05/22/2017), Disp: 20 tablet, Rfl: 0 .  ibuprofen (ADVIL,MOTRIN) 600 MG tablet, Take 1 tablet (600 mg total) every 6 (six) hours as needed by mouth. (Patient not taking: Reported on 06/27/2017), Disp: 40 tablet, Rfl: 1 .  Omega-3 Fatty Acids (FISH OIL) 1000 MG CAPS, Take 1,000 mg by mouth daily. , Disp: , Rfl:  .  oxyCODONE 10 MG TABS, Take 1 tablet (10 mg total) every 6 (six) hours as needed by mouth (pain scale > 7). (Patient not taking: Reported on 06/27/2017), Disp: 20 tablet, Rfl: 0 .  senna-docusate (SENOKOT-S) 8.6-50 MG tablet, Take 2 tablets daily by mouth. (Patient not taking: Reported on 05/22/2017), Disp: 6 tablet, Rfl: 0  Review of Systems   Constitutional: Negative for fever and chills Eyes: Negative for visual disturbances Respiratory: Negative for shortness of breath,  dyspnea Cardiovascular: Negative for chest pain or palpitations  Gastrointestinal: Negative for vomiting, diarrhea and constipation Genitourinary: Negative for dysuria and urgency Musculoskeletal: Negative for back pain, joint pain, myalgias  Neurological: Negative for dizziness and headaches    Objective:     Vitals:   06/27/17 1004  BP: (!) 102/58  Pulse: 80   General:  alert, cooperative and no distress   Breasts:  negative  Lungs: clear to auscultation bilaterally  Heart:  regular rate and rhythm  Abdomen: Soft, nontender healing well   Vulva:  normal  Vagina: normal vagina  Cervix:  closed  Corpus: Well involuted     Rectal Exam: no hemorrhoids        Assessment:    normal postpartum exam.  Plan:   1. Contraception: hyst 2. Follow up in:   or as needed.  3.Megace 800mg /daily for appetite   May use for a few months if it helps

## 2017-10-24 ENCOUNTER — Encounter: Payer: Self-pay | Admitting: Advanced Practice Midwife

## 2017-12-05 ENCOUNTER — Encounter: Payer: Self-pay | Admitting: Obstetrics and Gynecology

## 2018-01-03 ENCOUNTER — Other Ambulatory Visit (HOSPITAL_COMMUNITY)
Admission: RE | Admit: 2018-01-03 | Discharge: 2018-01-03 | Disposition: A | Discharge status: Home / Self Care | Payer: 59 | Source: Ambulatory Visit | Attending: Obstetrics & Gynecology | Admitting: Obstetrics & Gynecology

## 2018-01-03 ENCOUNTER — Ambulatory Visit (INDEPENDENT_AMBULATORY_CARE_PROVIDER_SITE_OTHER): Payer: 59 | Admitting: Women's Health

## 2018-01-03 ENCOUNTER — Encounter: Payer: Self-pay | Admitting: Women's Health

## 2018-01-03 VITALS — BP 110/74 | HR 115 | Ht 61.0 in | Wt 121.0 lb

## 2018-01-03 DIAGNOSIS — Z01419 Encounter for gynecological examination (general) (routine) without abnormal findings: Secondary | ICD-10-CM | POA: Diagnosis not present

## 2018-01-03 DIAGNOSIS — Z113 Encounter for screening for infections with a predominantly sexual mode of transmission: Secondary | ICD-10-CM

## 2018-01-03 DIAGNOSIS — Z01411 Encounter for gynecological examination (general) (routine) with abnormal findings: Secondary | ICD-10-CM | POA: Diagnosis not present

## 2018-01-03 DIAGNOSIS — R232 Flushing: Secondary | ICD-10-CM | POA: Diagnosis not present

## 2018-01-03 NOTE — Progress Notes (Signed)
   WELL-WOMAN EXAMINATION Patient name: PRICILLA MOEHLE MRN [id number]  Date of birth: May 13, 1981 Chief Complaint:   Gynecologic Exam  History of Present Illness:   DANNIKA HILGEMAN is a 37 y.o. G10P2103 African American female being seen today for a routine well-woman exam.  Current complaints: hot flashes at work  PCP: none      does desire labs including STD screen Patient's last menstrual period was 08/05/2016. The current method of family planning is s/p supracervical hysterectomy for placenta increta Last pap >52yrs ago. Results were: normal Last mammogram: never. Results were: n/a Last colonoscopy: never. Results were: n/a  Review of Systems:   Pertinent items are noted in HPI Denies any headaches, blurred vision, fatigue, shortness of breath, chest pain, abdominal pain, abnormal vaginal discharge/itching/odor/irritation, problems with periods, bowel movements, urination, or intercourse unless otherwise stated above. Pertinent History Reviewed:  Reviewed past medical,surgical, social and family history.  Reviewed problem list, medications and allergies. Physical Assessment:   Vitals:   01/03/18 0908  BP: 110/74  Pulse: (!) 115  Weight: 121 lb (54.9 kg)  Height: 5\' 1"  (1.549 m)  Body mass index is 22.86 kg/m.        Physical Examination:   General appearance - well appearing, and in no distress  Mental status - alert, oriented to person, place, and time  Psych:  She has a normal mood and affect  Skin - warm and dry, normal color, no suspicious lesions noted  Chest - effort normal, all lung fields clear to auscultation bilaterally  Heart - normal rate and regular rhythm  Neck:  midline trachea, no thyromegaly or nodules  Breasts - breasts appear normal, no suspicious masses, no skin or nipple changes or  axillary nodes  Abdomen - soft, nontender, nondistended, no masses or organomegaly  Pelvic - VULVA: normal appearing vulva with no masses, tenderness or  lesions  VAGINA: normal appearing vagina with normal color and discharge, no lesions  CERVIX: normal appearing cervix without discharge or lesions, no CMT  Thin prep pap is done w/ HR HPV cotesting  UTERUS: surgically absent  ADNEXA: No adnexal masses or tenderness noted.  Extremities:  No swelling or varicosities noted  No results found for this or any previous visit (from the past 24 hour(s)).  Assessment & Plan:  1) Well-Woman Exam  2) S/P supracervical hysterectomy d/t placenta increta  3) Hot flashes> check TSH  4) STD screen  Labs/procedures today: pap, labs  Mammogram @35yo  or sooner if problems Colonoscopy @45 -50yo or sooner if problems  Orders Placed This Encounter  Procedures  . CBC  . Comprehensive metabolic panel  . TSH  . HIV antibody  . RPR  . Hepatitis B surface antigen    Follow-up: Return in about 1 year (around 01/04/2019) for Physical.  [place], WHNP-BC 01/03/2018 9:40 AM

## 2018-01-03 NOTE — Patient Instructions (Signed)
Primary Care Providers  Dr. Zack Hall ([place]) [phone]  [facility] [phone]  [facility] ([place]) Dr. Gosrani [phone]  [facility] ([place]) [phone]  [facility] ([place]) [phone]  [facility] [phone]   

## 2018-01-04 LAB — CBC
HEMATOCRIT: 39.6 % (ref 34.0–46.6)
Hemoglobin: 13.9 g/dL (ref 11.1–15.9)
MCH: 33.5 pg — ABNORMAL HIGH (ref 26.6–33.0)
MCHC: 35.1 g/dL (ref 31.5–35.7)
MCV: 95 fL (ref 79–97)
PLATELETS: 215 10*3/uL (ref 150–450)
RBC: 4.15 x10E6/uL (ref 3.77–5.28)
RDW: 12.1 % — ABNORMAL LOW (ref 12.3–15.4)
WBC: 4.9 10*3/uL (ref 3.4–10.8)

## 2018-01-04 LAB — COMPREHENSIVE METABOLIC PANEL
A/G RATIO: 1.7 (ref 1.2–2.2)
ALK PHOS: 85 IU/L (ref 39–117)
ALT: 20 IU/L (ref 0–32)
AST: 20 IU/L (ref 0–40)
Albumin: 4.8 g/dL (ref 3.5–5.5)
BILIRUBIN TOTAL: 0.4 mg/dL (ref 0.0–1.2)
BUN/Creatinine Ratio: 15 (ref 9–23)
BUN: 14 mg/dL (ref 6–20)
CHLORIDE: 108 mmol/L — AB (ref 96–106)
CO2: 22 mmol/L (ref 20–29)
Calcium: 9.8 mg/dL (ref 8.7–10.2)
Creatinine, Ser: 0.94 mg/dL (ref 0.57–1.00)
GFR calc Af Amer: 91 mL/min/{1.73_m2} (ref >59)
GFR calc non Af Amer: 79 mL/min/{1.73_m2} (ref >59)
GLOBULIN, TOTAL: 2.8 g/dL (ref 1.5–4.5)
Glucose: 85 mg/dL (ref 65–99)
POTASSIUM: 4.1 mmol/L (ref 3.5–5.2)
SODIUM: 143 mmol/L (ref 134–144)
Total Protein: 7.6 g/dL (ref 6.0–8.5)

## 2018-01-04 LAB — RPR: RPR Ser Ql: NONREACTIVE

## 2018-01-04 LAB — HEPATITIS B SURFACE ANTIGEN: Hepatitis B Surface Ag: NEGATIVE

## 2018-01-04 LAB — HIV ANTIBODY (ROUTINE TESTING W REFLEX): HIV SCREEN 4TH GENERATION: NONREACTIVE

## 2018-01-04 LAB — TSH: TSH: 1.32 u[IU]/mL (ref 0.450–4.500)

## 2018-01-05 LAB — CYTOLOGY - PAP
Chlamydia: NEGATIVE
Diagnosis: NEGATIVE
HPV: NOT DETECTED
Neisseria Gonorrhea: NEGATIVE

## 2018-03-20 DIAGNOSIS — J069 Acute upper respiratory infection, unspecified: Secondary | ICD-10-CM | POA: Diagnosis not present

## 2018-03-20 DIAGNOSIS — Z00121 Encounter for routine child health examination with abnormal findings: Secondary | ICD-10-CM | POA: Diagnosis not present

## 2018-05-02 ENCOUNTER — Other Ambulatory Visit: Payer: Self-pay | Admitting: Advanced Practice Midwife

## 2018-05-02 MED ORDER — MEGESTROL ACETATE 400 MG/10ML PO SUSP: 800.0000 mg | Freq: Every day | ORAL | 6 refills | Status: DC

## 2018-06-06 ENCOUNTER — Other Ambulatory Visit: Payer: Self-pay | Admitting: Advanced Practice Midwife

## 2018-09-10 NOTE — Telephone Encounter (Signed)
Provider notified

## 2018-12-02 ENCOUNTER — Other Ambulatory Visit: Payer: Self-pay

## 2018-12-02 ENCOUNTER — Encounter (HOSPITAL_COMMUNITY): Payer: Self-pay

## 2018-12-02 ENCOUNTER — Emergency Department (HOSPITAL_COMMUNITY): Payer: 59

## 2018-12-02 ENCOUNTER — Emergency Department (HOSPITAL_COMMUNITY)
Admission: EM | Admit: 2018-12-02 | Discharge: 2018-12-02 | Disposition: A | Discharge status: Home / Self Care | Payer: 59 | Attending: Emergency Medicine | Admitting: Emergency Medicine

## 2018-12-02 DIAGNOSIS — Z79899 Other long term (current) drug therapy: Secondary | ICD-10-CM | POA: Diagnosis not present

## 2018-12-02 DIAGNOSIS — M545 Low back pain: Secondary | ICD-10-CM | POA: Diagnosis present

## 2018-12-02 DIAGNOSIS — Z87891 Personal history of nicotine dependence: Secondary | ICD-10-CM | POA: Insufficient documentation

## 2018-12-02 DIAGNOSIS — N83291 Other ovarian cyst, right side: Secondary | ICD-10-CM | POA: Diagnosis not present

## 2018-12-02 DIAGNOSIS — M549 Dorsalgia, unspecified: Secondary | ICD-10-CM | POA: Diagnosis not present

## 2018-12-02 DIAGNOSIS — N83201 Unspecified ovarian cyst, right side: Secondary | ICD-10-CM | POA: Diagnosis not present

## 2018-12-02 DIAGNOSIS — R109 Unspecified abdominal pain: Secondary | ICD-10-CM | POA: Diagnosis not present

## 2018-12-02 DIAGNOSIS — R1031 Right lower quadrant pain: Secondary | ICD-10-CM | POA: Diagnosis not present

## 2018-12-02 LAB — COMPREHENSIVE METABOLIC PANEL
ALT: 18 U/L (ref 0–44)
AST: 22 U/L (ref 15–41)
Albumin: 4.9 g/dL (ref 3.5–5.0)
Alkaline Phosphatase: 87 U/L (ref 38–126)
Anion gap: 13 (ref 5–15)
BUN: 13 mg/dL (ref 6–20)
CO2: 23 mmol/L (ref 22–32)
Calcium: 9.6 mg/dL (ref 8.9–10.3)
Chloride: 103 mmol/L (ref 98–111)
Creatinine, Ser: 0.63 mg/dL (ref 0.44–1.00)
GFR calc Af Amer: >60 mL/min (ref >60)
GFR calc non Af Amer: >60 mL/min (ref >60)
Glucose, Bld: 114 mg/dL — ABNORMAL HIGH (ref 70–99)
Potassium: 3.5 mmol/L (ref 3.5–5.1)
Sodium: 139 mmol/L (ref 135–145)
Total Bilirubin: 1 mg/dL (ref 0.3–1.2)
Total Protein: 8.4 g/dL — ABNORMAL HIGH (ref 6.5–8.1)

## 2018-12-02 LAB — CBC WITH DIFFERENTIAL/PLATELET
Abs Immature Granulocytes: 0.01 10*3/uL (ref 0.00–0.07)
Basophils Absolute: 0 10*3/uL (ref 0.0–0.1)
Basophils Relative: 0 %
Eosinophils Absolute: 0 10*3/uL (ref 0.0–0.5)
Eosinophils Relative: 0 %
HCT: 41.9 % (ref 36.0–46.0)
Hemoglobin: 14.7 g/dL (ref 12.0–15.0)
Immature Granulocytes: 0 %
Lymphocytes Relative: 15 %
Lymphs Abs: 1.2 10*3/uL (ref 0.7–4.0)
MCH: 33 pg (ref 26.0–34.0)
MCHC: 35.1 g/dL (ref 30.0–36.0)
MCV: 94.2 fL (ref 80.0–100.0)
Monocytes Absolute: 0.4 10*3/uL (ref 0.1–1.0)
Monocytes Relative: 5 %
Neutro Abs: 6 10*3/uL (ref 1.7–7.7)
Neutrophils Relative %: 80 %
Platelets: 218 10*3/uL (ref 150–400)
RBC: 4.45 MIL/uL (ref 3.87–5.11)
RDW: 12.3 % (ref 11.5–15.5)
WBC: 7.6 10*3/uL (ref 4.0–10.5)
nRBC: 0 % (ref 0.0–0.2)

## 2018-12-02 LAB — URINALYSIS, ROUTINE W REFLEX MICROSCOPIC
Bilirubin Urine: NEGATIVE
Glucose, UA: NEGATIVE mg/dL
Ketones, ur: 80 mg/dL — AB
Leukocytes,Ua: NEGATIVE
Nitrite: NEGATIVE
Protein, ur: NEGATIVE mg/dL
Specific Gravity, Urine: 1.015 (ref 1.005–1.030)
pH: 6 (ref 5.0–8.0)

## 2018-12-02 LAB — LIPASE, BLOOD: Lipase: 24 U/L (ref 11–51)

## 2018-12-02 MED ORDER — IOHEXOL 300 MG/ML  SOLN
100.0000 mL | Freq: Once | INTRAMUSCULAR | Status: AC | PRN
  Administered 2018-12-02: 10:00:00 100 mL via INTRAVENOUS

## 2018-12-02 MED ORDER — HYDROCODONE-ACETAMINOPHEN 5-325 MG PO TABS: 1.0000 | ORAL_TABLET | Freq: Four times a day (QID) | ORAL | 0 refills | Status: DC | PRN

## 2018-12-02 MED ORDER — SODIUM CHLORIDE 0.9 % IV SOLN
INTRAVENOUS | Status: DC
  Administered 2018-12-02: 10:00:00 via INTRAVENOUS

## 2018-12-02 MED ORDER — ONDANSETRON HCL 4 MG/2ML IJ SOLN
4.0000 mg | Freq: Once | INTRAMUSCULAR | Status: AC
  Administered 2018-12-02: 4 mg via INTRAVENOUS
  Filled 2018-12-02: qty 2

## 2018-12-02 MED ORDER — HYDROMORPHONE HCL 1 MG/ML IJ SOLN
0.5000 mg | Freq: Once | INTRAMUSCULAR | Status: AC
  Administered 2018-12-02: 10:00:00 0.5 mg via INTRAVENOUS
  Filled 2018-12-02: qty 1

## 2018-12-02 MED ORDER — NAPROXEN 500 MG PO TABS: 500.0000 mg | ORAL_TABLET | Freq: Two times a day (BID) | ORAL | 0 refills | Status: DC

## 2018-12-02 NOTE — ED Provider Notes (Signed)
[facility] EMERGENCY DEPARTMENT Provider Note   CSN: [id number] Arrival date & time: 12/02/18  5681    History   Chief Complaint Chief Complaint  Patient presents with   Back Pain    HPI Maureen Key is a 38 y.o. female.     Patient yesterday prior to going to work had some discomfort in her back on the right side.  At work she lifted a heavy crate and things seem to get worse.  But the pain was radiating around to her right lower quadrant.  No fever.  No upper respiratory infection symptoms.  Patient's had a hysterectomy.  No vaginal discharge.  [facility].  Denies any dysuria or hematuria.  No nausea vomiting or diarrhea.     Past Medical History:  Diagnosis Date   Blood transfusion without reported diagnosis    last [place] delivery   Dysmenorrhea    History of placenta accreta in prior pregnancy, currently pregnant    History of postpartum hemorrhage, currently pregnant    History of sepsis    Pregnancy induced hypertension    Sickle cell trait ([place])    Trichimoniasis    Vaginal Pap smear, abnormal     Patient Active Problem List   Diagnosis Date Noted   H/O abdominal supracervical subtotal hysterectomy 06/27/2017   Status post emergency cesarean hysterectomy 05/12/2017   History of acute respiratory failure 05/08/2017   History of postpartum hemorrhage 05/08/2017   Trichimoniasis 01/18/2017    Past Surgical History:  Procedure Laterality Date   ABDOMINAL HYSTERECTOMY  05/12/2017   Procedure: HYSTERECTOMY ABDOMINAL;  Surgeon: Jonnie Kind, MD;  Location: [place];  Service: Obstetrics;;   CERVICAL CONE BIOPSY     CESAREAN SECTION     CESAREAN SECTION N/A 05/12/2017   Procedure: CESAREAN SECTION WITH Hysterectomy;  Surgeon: Jonnie Kind, MD;  Location: [place];  Service: Obstetrics;  Laterality: N/A;     OB History    Gravida  3   Para  3   Term  2   Preterm  1   AB       Living  3     SAB      TAB      Ectopic      Multiple  1   Live Births  3        Obstetric Comments  Mono/mono twins, scheduled         Home Medications    Prior to Admission medications   Medication Sig Start Date End Date Taking? Authorizing Provider  megestrol (MEGACE) 400 MG/10ML suspension Take 20 mLs (800 mg total) by mouth daily. 05/02/18   Cresenzo-Dishmon, Joaquim Lai, CNM    Family History Family History  Problem Relation Age of Onset   Hypertension Mother    HIV/AIDS Father    Cancer Father        lung   Diabetes Sister    Diabetes Maternal Aunt    Diabetes Maternal Uncle    Cancer Paternal Aunt    Cancer Paternal Uncle    Diabetes Maternal Grandmother    Alzheimer's disease Paternal Grandmother     Social History Social History   Tobacco Use   Smoking status: Former Smoker    Packs/day: 0.25    Last attempt to quit: 03/08/2017    Years since quitting: 1.7   Smokeless tobacco: Never Used   Tobacco comment: 2 a day  Substance Use Topics  Alcohol use: No   Drug use: Yes    Types: Marijuana    Comment: last used 04/09/17     Allergies   Patient has no known allergies.   Review of Systems Review of Systems  Constitutional: Negative for chills and fever.  HENT: Negative for congestion, rhinorrhea and sore throat.   Eyes: Negative for visual disturbance.  Respiratory: Negative for cough and shortness of breath.   Cardiovascular: Negative for chest pain and leg swelling.  Gastrointestinal: Positive for abdominal pain. Negative for diarrhea, nausea and vomiting.  Genitourinary: Negative for dysuria, hematuria and vaginal discharge.  Musculoskeletal: Positive for back pain. Negative for neck pain.  Skin: Negative for rash.  Neurological: Negative for dizziness, light-headedness and headaches.  Hematological: Does not bruise/bleed easily.  Psychiatric/Behavioral: Negative for confusion.     Physical Exam Updated  Vital Signs BP (!) 141/97 (BP Location: Left Arm)    Pulse 63    Temp 98.9 F (37.2 C) (Oral)    Resp 14    Ht 1.524 m (5')    Wt 58.1 kg    LMP 08/05/2016    SpO2 100%    BMI 25.00 kg/m   Physical Exam Vitals signs and nursing note reviewed.  Constitutional:      General: She is not in acute distress.    Appearance: She is well-developed.  HENT:     Head: Normocephalic and atraumatic.  Eyes:     Extraocular Movements: Extraocular movements intact.     Conjunctiva/sclera: Conjunctivae normal.     Pupils: Pupils are equal, round, and reactive to light.  Neck:     Musculoskeletal: Normal range of motion and neck supple.  Cardiovascular:     Rate and Rhythm: Normal rate and regular rhythm.     Heart sounds: Normal heart sounds. No murmur.  Pulmonary:     Effort: Pulmonary effort is normal. No respiratory distress.     Breath sounds: Normal breath sounds.  Abdominal:     General: Bowel sounds are normal.     Palpations: Abdomen is soft.     Tenderness: There is abdominal tenderness.  Musculoskeletal: Normal range of motion.        General: No swelling.  Skin:    General: Skin is warm and dry.  Neurological:     General: No focal deficit present.     Mental Status: She is alert and oriented to person, place, and time.     Cranial Nerves: No cranial nerve deficit.     Sensory: No sensory deficit.     Coordination: Coordination normal.      ED Treatments / Results  Labs (all labs ordered are listed, but only abnormal results are displayed) Labs Reviewed  COMPREHENSIVE METABOLIC PANEL - Abnormal; Notable for the following components:      Result Value   Glucose, Bld 114 (*)    Total Protein 8.4 (*)    All other components within normal limits  URINALYSIS, ROUTINE W REFLEX MICROSCOPIC - Abnormal; Notable for the following components:   Hgb urine dipstick SMALL (*)    Ketones, ur 80 (*)    Bacteria, UA MANY (*)    All other components within normal limits  LIPASE, BLOOD    CBC WITH DIFFERENTIAL/PLATELET    EKG None  Radiology Ct Abdomen Pelvis W Contrast  Result Date: 12/02/2018 CLINICAL DATA:  Right lower quadrant pain EXAM: CT ABDOMEN AND PELVIS WITH CONTRAST TECHNIQUE: Multidetector CT imaging of the abdomen and pelvis was performed  using the standard protocol following bolus administration of intravenous contrast. CONTRAST:  182mL OMNIPAQUE IOHEXOL 300 MG/ML  SOLN COMPARISON:  Nov 16, 2007 FINDINGS: Lower chest: Lung bases are clear. Hepatobiliary: No focal liver lesions are evident. Gallbladder wall is not appreciably thickened. There is no biliary duct dilatation. Pancreas: There is no pancreatic mass or inflammatory focus. Spleen: No splenic lesions evident. Adrenals/Urinary Tract: Adrenals bilaterally appear unremarkable. There is a cyst in the posterior aspect of the upper pole of the right kidney measuring 9 x 8 mm. There is a 1 x 1 cm cyst arising in the lower pole of the right kidney. There is no appreciable hydronephrosis on either side. There is no evident renal or ureteral calculus on either side. There is thickening of the wall of the urinary bladder. Stomach/Bowel: There is no appreciable bowel wall or mesenteric thickening. There is no evident bowel obstruction. Terminal ileum appears unremarkable. There is no free air or portal venous air. Vascular/Lymphatic: There is mild atherosclerotic plaque in the aorta. No aneurysm evident. Major mesenteric arterial vessels appear patent. There is no evident adenopathy in the abdomen or pelvis. Reproductive: The uterus is absent. There is a complex right adnexal mass with both cystic and solid components as well as areas of peripheral calcification. This complex mass measures 5.1 x 5.0 x 4.5 cm. This mass deviates the urinary bladder toward the left. A small amount of free fluid is noted in the dependent portion of the pelvis. Other: Appendix appears normal. There is no evident abscess beyond the potential  tubo-ovarian abscess in the right adnexal region. There is no ascites beyond the small amount of fluid in the dependent portion of the pelvis. Musculoskeletal: There are no blastic or lytic bone lesions. There is no intramuscular or abdominal wall lesion. IMPRESSION: 1. Complex mass arising in the right adnexa. This mass measures 5.1 x 5.0 x 4.5 cm. It has both cystic and solid components as well as foci of rim-like calcification. Differential considerations for this mass includes tubo-ovarian abscess, endometrioma, and possible ovarian neoplasm. Pelvic ultrasound may be helpful for further assessment in this regard. Note that this mass causes leftward deviation of the urinary bladder. Uterus is absent. 2. Urinary bladder wall appears somewhat thickened, a finding indicative of cystitis. 3. Appendix appears normal. No abscess is seen in the abdomen or pelvis beyond the potential tubo-ovarian abscess in the right adnexal region. No bowel obstruction. 4.  No evident renal or ureteral calculus.  No hydronephrosis. Electronically Signed   By: Lowella Grip III M.D.   On: 12/02/2018 11:05    Procedures Procedures (including critical care time)  Medications Ordered in ED Medications  0.9 %  sodium chloride infusion ( Intravenous New Bag/Given 12/02/18 0931)  ondansetron (ZOFRAN) injection 4 mg (4 mg Intravenous Given 12/02/18 0931)  HYDROmorphone (DILAUDID) injection 0.5 mg (0.5 mg Intravenous Given 12/02/18 0935)  iohexol (OMNIPAQUE) 300 MG/ML solution 100 mL (100 mLs Intravenous Contrast Given 12/02/18 1017)     Initial Impression / Assessment and Plan / ED Course  I have reviewed the triage vital signs and the nursing notes.  Pertinent labs & imaging results that were available during my care of the patient were reviewed by me and considered in my medical decision making (see chart for details).    Work-up reveals large complex cyst of the right ovary.  Urinalysis not frankly consistent with  urinary tract infection but there is bacteria present does not appear to be contaminated.  Urine culture sent  and if it shows a significant bacterial growth patient will be contacted.  Discussed with [facility] Dr. Elonda Husky.  He reviewed the CT scans.  Just does not think that this is related to abscess.  States that patient does not have an entry point since she is had a hysterectomy and it does not appear to be coming from the GI tract.  They will follow her up in the office with ultrasound consider repeat ultrasound in 4 weeks.  All this explained to patient patient stable for discharge home.  Given work note pain medication and anti-inflammatory medicine.  CT showed no evidence of appendicitis which was the initial original concern.      Final Clinical Impressions(s) / ED Diagnoses   Final diagnoses:  Cyst of right ovary  Complex cyst of right ovary    ED Discharge Orders    None       Fredia Sorrow, MD 12/02/18 1352

## 2018-12-02 NOTE — ED Triage Notes (Signed)
Pt reports lower back pain that radiates around to lower abd after lifting a 12 pack of soda at work yesterday.

## 2018-12-02 NOTE — ED Notes (Signed)
Pt returned from CT °

## 2018-12-02 NOTE — Discharge Instructions (Addendum)
Discussed with Dr. Elonda Husky from [facility].  They want a follow-up with an ultrasound in 4 weeks.  Call next week to set up the appointment.  Return for any new or worse symptoms to include fever passing out worse abdominal pain.

## 2018-12-02 NOTE — ED Notes (Signed)
EDP at bedside updating patient. 

## 2018-12-04 ENCOUNTER — Telehealth: Payer: Self-pay | Admitting: Obstetrics & Gynecology

## 2018-12-04 NOTE — Telephone Encounter (Signed)
Pt wants to make appt with Dr Elonda Husky to f/u for Cyst on her right ovary/ pt went to [facility] er on the 24th. Please advise

## 2018-12-04 NOTE — Telephone Encounter (Signed)
She needs to make an appointment to see me prior to surgery being scheduled  I reviewed the CT scan

## 2018-12-17 ENCOUNTER — Other Ambulatory Visit: Payer: Self-pay | Admitting: Advanced Practice Midwife

## 2018-12-17 ENCOUNTER — Encounter: Payer: Self-pay | Admitting: *Deleted

## 2018-12-18 ENCOUNTER — Encounter: Payer: Self-pay | Admitting: Obstetrics & Gynecology

## 2018-12-18 ENCOUNTER — Ambulatory Visit (INDEPENDENT_AMBULATORY_CARE_PROVIDER_SITE_OTHER): Payer: 59 | Admitting: Obstetrics & Gynecology

## 2018-12-18 ENCOUNTER — Other Ambulatory Visit: Payer: Self-pay

## 2018-12-18 VITALS — BP 101/72 | HR 82 | Ht 60.0 in | Wt 123.4 lb

## 2018-12-18 DIAGNOSIS — N83201 Unspecified ovarian cyst, right side: Secondary | ICD-10-CM

## 2018-12-18 MED ORDER — MEGESTROL ACETATE 40 MG PO TABS: ORAL_TABLET | ORAL | 3 refills | Status: DC

## 2018-12-18 NOTE — Progress Notes (Signed)
Chief Complaint  Patient presents with  . Follow-up cyst rt ovary    [facility] ER 5-24      38 y.o. [id number] Patient's last menstrual period was 08/05/2016. The current method of family planning is status post hysterectomy.  Outpatient Encounter Medications as of 12/18/2018  Medication Sig  . HYDROcodone-acetaminophen (NORCO/VICODIN) 5-325 MG tablet Take 1 tablet by mouth every 6 (six) hours as needed.  . naproxen (NAPROSYN) 500 MG tablet Take 1 tablet (500 mg total) by mouth 2 (two) times daily.  . megestrol (MEGACE) 40 MG tablet 3 tablets a day for 5 days, 2 tablets a day for 5 days then 1 tablet daily  . megestrol (MEGACE) 400 MG/10ML suspension Take 20 mLs (800 mg total) by mouth daily. (Patient not taking: Reported on 12/18/2018)   No facility-administered encounter medications on file as of 12/18/2018.     Subjective Pt is seen as follow up from ED Was seen and evaluated 5/28 for acute RLQ pain, I was called and consulted due to a "TOA" but she can't have a TOA primarily anyway because she is s/P Caesarean hysterectomy 05/2017 and there is no vaginal access for a TROA to form I viewed CT and felt this was a complex cyst Since being seen Juliza states she is improved with her pain and now doing well Will managen with magestrol and repet imaging in 6 weeks Past Medical History:  Diagnosis Date  . Blood transfusion without reported diagnosis    last [place] delivery  . Dysmenorrhea   . History of placenta accreta in prior pregnancy, currently pregnant   . History of postpartum hemorrhage, currently pregnant   . History of sepsis   . Pregnancy induced hypertension   . Sickle cell trait ([place])   . Trichimoniasis   . Vaginal Pap smear, abnormal     Past Surgical History:  Procedure Laterality Date  . ABDOMINAL HYSTERECTOMY  05/12/2017   Procedure: HYSTERECTOMY ABDOMINAL;  Surgeon: Jonnie Kind, MD;  Location: [place];  Service: Obstetrics;;  . CERVICAL CONE  BIOPSY    . CESAREAN SECTION    . CESAREAN SECTION N/A 05/12/2017   Procedure: CESAREAN SECTION WITH Hysterectomy;  Surgeon: Jonnie Kind, MD;  Location: [place];  Service: Obstetrics;  Laterality: N/A;    OB History    Gravida  3   Para  3   Term  2   Preterm  1   AB      Living  3     SAB      TAB      Ectopic      Multiple  1   Live Births  3        Obstetric Comments  Mono/mono twins, scheduled        No Known Allergies  Social History   Socioeconomic History  . Marital status: Single    Spouse name: Not on file  . Number of children: 4  . Years of education: Not on file  . Highest education level: Not on file  Occupational History  . Not on file  Social Needs  . Financial resource strain: Not on file  . Food insecurity:    Worry: Not on file    Inability: Not on file  . Transportation needs:    Medical: Not on file    Non-medical: Not on file  Tobacco Use  . Smoking status: Former Smoker    Packs/day: 0.25  Last attempt to quit: 03/08/2017    Years since quitting: 1.7  . Smokeless tobacco: Never Used  . Tobacco comment: 2 a day  Substance and Sexual Activity  . Alcohol use: No  . Drug use: Yes    Types: Marijuana    Comment: last used 04/09/17  . Sexual activity: Yes    Birth control/protection: None, Surgical  Lifestyle  . Physical activity:    Days per week: Not on file    Minutes per session: Not on file  . Stress: Not on file  Relationships  . Social connections:    Talks on phone: Not on file    Gets together: Not on file    Attends religious service: Not on file    Active member of club or organization: Not on file    Attends meetings of clubs or organizations: Not on file    Relationship status: Not on file  Other Topics Concern  . Not on file  Social History Narrative  . Not on file    Family History  Problem Relation Age of Onset  . Hypertension Mother   . HIV/AIDS Father   . Cancer Father         lung  . Diabetes Sister   . Diabetes Maternal Aunt   . Diabetes Maternal Uncle   . Cancer Paternal Aunt   . Cancer Paternal Uncle   . Diabetes Maternal Grandmother   . Alzheimer's disease Paternal Grandmother     Medications:       Current Outpatient Medications:  .  HYDROcodone-acetaminophen (NORCO/VICODIN) 5-325 MG tablet, Take 1 tablet by mouth every 6 (six) hours as needed., Disp: 10 tablet, Rfl: 0 .  naproxen (NAPROSYN) 500 MG tablet, Take 1 tablet (500 mg total) by mouth 2 (two) times daily., Disp: 14 tablet, Rfl: 0 .  megestrol (MEGACE) 40 MG tablet, 3 tablets a day for 5 days, 2 tablets a day for 5 days then 1 tablet daily, Disp: 45 tablet, Rfl: 3 .  megestrol (MEGACE) 400 MG/10ML suspension, Take 20 mLs (800 mg total) by mouth daily. (Patient not taking: Reported on 12/18/2018), Disp: 480 mL, Rfl: 6  Objective Blood pressure 101/72, pulse 82, height 5' (1.524 m), weight 123 lb 6.4 oz (56 kg), last menstrual period 08/05/2016, not currently breastfeeding.  Gen WDWN NAD  Pertinent ROS No burning with urination, frequency or urgency No nausea, vomiting or diarrhea Nor fever chills or other constitutional symptoms   Labs or studies Reviewed CT and labs from ED    Impression Diagnoses this Encounter::   ICD-10-CM   1. Cyst of right ovary N83.201 US PELVIS TRANSVANGINAL NON-OB (TV ONLY)    Established relevant diagnosis(es):   Plan/Recommendations: Meds ordered this encounter  Medications  . megestrol (MEGACE) 40 MG tablet    Sig: 3 tablets a day for 5 days, 2 tablets a day for 5 days then 1 tablet daily    Dispense:  45 tablet    Refill:  3    Labs or Scans Ordered: Orders Placed This Encounter  Procedures  . US PELVIS TRANSVANGINAL NON-OB (TV ONLY)    Management:: >megestrol suppression > follow up scan 6 weeks  Follow up Return in about 6 weeks (around 01/29/2019) for GYN sono, Follow up, with Dr Elonda Husky.        Face to face time:  15 minutes   Greater than 50% of the visit time was spent in counseling and coordination of care with the patient.  The  summary and outline of the counseling and care coordination is summarized in the note above.   All questions were answered.

## 2019-01-17 ENCOUNTER — Other Ambulatory Visit: Payer: Self-pay | Admitting: Obstetrics & Gynecology

## 2019-01-17 DIAGNOSIS — N83201 Unspecified ovarian cyst, right side: Secondary | ICD-10-CM

## 2019-01-21 ENCOUNTER — Ambulatory Visit (INDEPENDENT_AMBULATORY_CARE_PROVIDER_SITE_OTHER): Payer: Self-pay

## 2019-01-21 ENCOUNTER — Other Ambulatory Visit: Payer: Self-pay

## 2019-01-21 DIAGNOSIS — N83201 Unspecified ovarian cyst, right side: Secondary | ICD-10-CM

## 2019-01-21 NOTE — Progress Notes (Signed)
PELVIC TA/TV:normal cervical stump,normal left ovary,hemorrhagic right ovarian cyst 2.4 x 2.2 x 1.8 cm,ovaries appear mobile,no pain during ultrasound

## 2019-01-24 ENCOUNTER — Ambulatory Visit: Payer: 59 | Admitting: Obstetrics & Gynecology

## 2019-01-24 ENCOUNTER — Other Ambulatory Visit: Payer: 59

## 2019-01-25 ENCOUNTER — Ambulatory Visit (INDEPENDENT_AMBULATORY_CARE_PROVIDER_SITE_OTHER): Payer: Self-pay | Admitting: Obstetrics & Gynecology

## 2019-01-25 ENCOUNTER — Encounter: Payer: Self-pay | Admitting: *Deleted

## 2019-01-25 ENCOUNTER — Other Ambulatory Visit: Payer: Self-pay

## 2019-01-25 DIAGNOSIS — N83201 Unspecified ovarian cyst, right side: Secondary | ICD-10-CM

## 2019-01-27 MED ORDER — MEGESTROL ACETATE 40 MG PO TABS: ORAL_TABLET | ORAL | 3 refills | Status: DC

## 2019-01-27 NOTE — Progress Notes (Signed)
TELEHEALTH VIRTUAL GYNECOLOGY VISIT ENCOUNTER NOTE  I connected with Maureen Key on 02/04/19 at  1:00 PM EDT by telephone at home and verified that I am speaking with the correct person using two identifiers.   I discussed the limitations, risks, security and privacy concerns of performing an evaluation and management service by telephone and the availability of in person appointments. I also discussed with the patient that there may be a patient responsible charge related to this service. The patient expressed understanding and agreed to proceed.   History:  Maureen Key is a 38 y.o. [phone] female being evaluated today for management of a symptomatic physiologic hemorrhagic corpus luteum of the right ovary.  See sonogram 7.13.2020. She denies any abnormal vaginal discharge, bleeding, pelvic pain or other concerns.       Past Medical History:  Diagnosis Date  . Blood transfusion without reported diagnosis    last [place] delivery  . Dysmenorrhea   . History of placenta accreta in prior pregnancy, currently pregnant   . History of postpartum hemorrhage, currently pregnant   . History of sepsis   . Pregnancy induced hypertension   . Sickle cell trait ([place])   . Trichimoniasis   . Vaginal Pap smear, abnormal    Past Surgical History:  Procedure Laterality Date  . ABDOMINAL HYSTERECTOMY  05/12/2017   Procedure: HYSTERECTOMY ABDOMINAL;  Surgeon: Jonnie Kind, MD;  Location: [place];  Service: Obstetrics;;  . CERVICAL CONE BIOPSY    . CESAREAN SECTION    . CESAREAN SECTION N/A 05/12/2017   Procedure: CESAREAN SECTION WITH Hysterectomy;  Surgeon: Jonnie Kind, MD;  Location: [place];  Service: Obstetrics;  Laterality: N/A;   The following portions of the patient's history were reviewed and updated as appropriate: allergies, current medications, past family history, past medical history, past social history, past surgical history and  problem list.   Health Maintenance:  Review of Systems:  Pertinent items noted in HPI and remainder of comprehensive ROS otherwise negative.  Physical Exam:  Physical exam deferred due to nature of the encounter  Labs and Imaging No results found for this or any previous visit (from the past 336 hour(s)). US Pelvis Transvanginal Non-ob (tv Only)  Result Date: 01/24/2019 GYNECOLOGIC SONOGRAM Maureen Key is a 38 y.o. [phone] s/p hysterectomy,she is here for a pelvic sonogram for right ovarian mass seen on CT.Marland Kitchen Uterus                     Surgically removed,normal cervical stump Endometrium          n/a Right ovary             3 x 2.4 x 3.1 cm, hemorrhagic right ovarian cyst 2.4 x 2.2 x 1.8 cm Left ovary                2.2 x 2.3 x 1.5 cm, wnl No free fluid Technician Comments: PELVIC TA/TV:normal cervical stump,normal left ovary,hemorrhagic right ovarian cyst 2.4 x 2.2 x 1.8 cm,ovaries appear mobile,no pain during ultrasound Chaperone:Amanda Silver Huguenin 01/21/2019 3:39 PM Clinical Impression and recommendations: I have reviewed the sonogram results above, combined with the patient's current clinical course, below are my impressions and any appropriate recommendations for management based on the sonographic findings. Physiologic right ovarian cyst, hemorrhagic, small Left ovary normal Uterus is absent with only cervical stump remaining No indication to repeat scan for this physiologic ovarian cyst  This recommendation follows the consensus statement: Management of Asymptomatic Ovarian and Other Adnexal Cysts Imaged at [place]: Society of Radiologists in [place]. Radiology 2010; [phone] Florian Buff 01/24/2019 10:36 AM  US Pelvis Complete  Result Date: 01/24/2019 GYNECOLOGIC SONOGRAM Maureen Key is a 38 y.o. [phone] s/p hysterectomy,she is here for a pelvic sonogram for right ovarian mass seen on CT.Marland Kitchen Uterus                     Surgically removed,normal  cervical stump Endometrium          n/a Right ovary             3 x 2.4 x 3.1 cm, hemorrhagic right ovarian cyst 2.4 x 2.2 x 1.8 cm Left ovary                2.2 x 2.3 x 1.5 cm, wnl No free fluid Technician Comments: PELVIC TA/TV:normal cervical stump,normal left ovary,hemorrhagic right ovarian cyst 2.4 x 2.2 x 1.8 cm,ovaries appear mobile,no pain during ultrasound Chaperone:Amanda Silver Huguenin 01/21/2019 3:39 PM Clinical Impression and recommendations: I have reviewed the sonogram results above, combined with the patient's current clinical course, below are my impressions and any appropriate recommendations for management based on the sonographic findings. Physiologic right ovarian cyst, hemorrhagic, small Left ovary normal Uterus is absent with only cervical stump remaining No indication to repeat scan for this physiologic ovarian cyst This recommendation follows the consensus statement: Management of Asymptomatic Ovarian and Other Adnexal Cysts Imaged at [place]: Society of Radiologists in [place]. Radiology 2010; [phone] Florian Buff 01/24/2019 10:36 AM      Meds ordered this encounter  Medications  . megestrol (MEGACE) 40 MG tablet    Sig: 1 tablet daily    Dispense:  30 tablet    Refill:  3    No orders of the defined types were placed in this encounter.   Assessment and Plan:       ICD-10-CM   1. Cyst of right ovary  N83.201   No specific imaging follow up is required with this physiologic cyst This recommendation follows the consensus statement: Management of Asymptomatic Ovarian and Other Adnexal Cysts Imaged at [place]: Society of Radiologists in [place]. Radiology 2010; [phone]       I discussed the assessment and treatment plan with the patient. The patient was provided an opportunity to ask questions and all were answered. The patient agreed with the plan and demonstrated an understanding of the instructions.    The patient was advised to call back or seek an in-person evaluation/go to the ED if the symptoms worsen or if the condition fails to improve as anticipated.  I provided 11 minutes of non-face-to-face time during this encounter.   Florian Buff, MD [facility]       Face to face time:  11 minutes  Greater than 50% of the visit time was spent in counseling and coordination of care with the patient.  The summary and outline of the counseling and care coordination is summarized in the note above.   All questions were answered.

## 2019-02-04 ENCOUNTER — Encounter: Payer: Self-pay | Admitting: Obstetrics & Gynecology

## 2019-05-24 ENCOUNTER — Other Ambulatory Visit: Payer: Self-pay

## 2019-05-24 DIAGNOSIS — Z20822 Contact with and (suspected) exposure to covid-19: Secondary | ICD-10-CM

## 2019-05-27 LAB — NOVEL CORONAVIRUS, NAA: SARS-CoV-2, NAA: NOT DETECTED

## 2019-07-28 ENCOUNTER — Emergency Department (HOSPITAL_COMMUNITY)
Admission: EM | Admit: 2019-07-28 | Discharge: 2019-07-28 | Disposition: A | Discharge status: Home / Self Care | Payer: Medicaid Other | Attending: Emergency Medicine | Admitting: Emergency Medicine

## 2019-07-28 ENCOUNTER — Other Ambulatory Visit: Payer: Self-pay

## 2019-07-28 ENCOUNTER — Encounter (HOSPITAL_COMMUNITY): Payer: Self-pay | Admitting: Emergency Medicine

## 2019-07-28 DIAGNOSIS — Z87891 Personal history of nicotine dependence: Secondary | ICD-10-CM | POA: Insufficient documentation

## 2019-07-28 DIAGNOSIS — K0889 Other specified disorders of teeth and supporting structures: Secondary | ICD-10-CM | POA: Insufficient documentation

## 2019-07-28 DIAGNOSIS — Z79899 Other long term (current) drug therapy: Secondary | ICD-10-CM | POA: Insufficient documentation

## 2019-07-28 MED ORDER — AMOXICILLIN 500 MG PO CAPS: 500.0000 mg | ORAL_CAPSULE | Freq: Three times a day (TID) | ORAL | 0 refills | Status: DC

## 2019-07-28 MED ORDER — HYDROCODONE-ACETAMINOPHEN 5-325 MG PO TABS
1.0000 | ORAL_TABLET | Freq: Once | ORAL | Status: AC
  Administered 2019-07-28: 09:00:00 1 via ORAL
  Filled 2019-07-28: qty 1

## 2019-07-28 MED ORDER — AMOXICILLIN 250 MG PO CAPS
1000.0000 mg | ORAL_CAPSULE | Freq: Once | ORAL | Status: AC
  Administered 2019-07-28: 1000 mg via ORAL
  Filled 2019-07-28: qty 4

## 2019-07-28 MED ORDER — HYDROCODONE-ACETAMINOPHEN 5-325 MG PO TABS: 1.0000 | ORAL_TABLET | Freq: Four times a day (QID) | ORAL | 0 refills | Status: AC | PRN

## 2019-07-28 NOTE — Discharge Instructions (Signed)
Thank you for allowing me to care for you today. Please return to the emergency department if you have new or worsening symptoms. Take your medications as instructed.  ° °

## 2019-07-28 NOTE — ED Provider Notes (Signed)
[facility] EMERGENCY DEPARTMENT Provider Note   CSN: [id number] Arrival date & time: 07/28/19  [id number]     History Chief Complaint  Patient presents with  . Dental Pain    Maureen Key is a 39 y.o. female.  Patient is a 39 year old female presenting to the emergency department for dental pain.  Patient reports that she has a cracked tooth in her right lower jaw.  Reports that she is eating Laffey taffy this past Friday and it chipped the tooth some more.  Reports that she began to have significant pain and swelling in the right lower gums since Friday.  Has tried over-the-counter medications without relief.  Reports some swelling in the jaw but no trouble breathing or swallowing.  Felt feverish but no recorded temperature.        Past Medical History:  Diagnosis Date  . Blood transfusion without reported diagnosis    last [place] delivery  . Dysmenorrhea   . History of placenta accreta in prior pregnancy, currently pregnant   . History of postpartum hemorrhage, currently pregnant   . History of sepsis   . Pregnancy induced hypertension   . Sickle cell trait ([place])   . Trichimoniasis   . Vaginal Pap smear, abnormal     Patient Active Problem List   Diagnosis Date Noted  . H/O abdominal supracervical subtotal hysterectomy 06/27/2017  . Status post emergency cesarean hysterectomy 05/12/2017  . History of acute respiratory failure 05/08/2017  . History of postpartum hemorrhage 05/08/2017  . Trichimoniasis 01/18/2017    Past Surgical History:  Procedure Laterality Date  . ABDOMINAL HYSTERECTOMY  05/12/2017   Procedure: HYSTERECTOMY ABDOMINAL;  Surgeon: Jonnie Kind, MD;  Location: [place];  Service: Obstetrics;;  . CERVICAL CONE BIOPSY    . CESAREAN SECTION    . CESAREAN SECTION N/A 05/12/2017   Procedure: CESAREAN SECTION WITH Hysterectomy;  Surgeon: Jonnie Kind, MD;  Location: [place];  Service: Obstetrics;  Laterality: N/A;     OB  History    Gravida  3   Para  3   Term  2   Preterm  1   AB      Living  3     SAB      TAB      Ectopic      Multiple  1   Live Births  3        Obstetric Comments  Mono/mono twins, scheduled        Family History  Problem Relation Age of Onset  . Hypertension Mother   . HIV/AIDS Father   . Cancer Father        lung  . Diabetes Sister   . Diabetes Maternal Aunt   . Diabetes Maternal Uncle   . Cancer Paternal Aunt   . Cancer Paternal Uncle   . Diabetes Maternal Grandmother   . Alzheimer's disease Paternal Grandmother     Social History   Tobacco Use  . Smoking status: Former Smoker    Packs/day: 0.25    Quit date: 03/08/2017    Years since quitting: 2.3  . Smokeless tobacco: Never Used  . Tobacco comment: 2 a day  Substance Use Topics  . Alcohol use: No  . Drug use: Yes    Types: Marijuana    Comment: last used 04/09/17    Home Medications Prior to Admission medications   Medication Sig Start Date End Date Taking? Authorizing Provider  amoxicillin (AMOXIL) 500 MG capsule  Take 1 capsule (500 mg total) by mouth 3 (three) times daily. 07/28/19   Alveria Apley, PA-C  HYDROcodone-acetaminophen (NORCO/VICODIN) 5-325 MG tablet Take 1 tablet by mouth every 6 (six) hours as needed for up to 2 days for severe pain. 07/28/19 07/30/19  Alveria Apley, PA-C  megestrol (MEGACE) 40 MG tablet 3 tablets a day for 5 days, 2 tablets a day for 5 days then 1 tablet daily 12/18/18   Florian Buff, MD  megestrol (MEGACE) 40 MG tablet 1 tablet daily 01/27/19   Florian Buff, MD  megestrol (MEGACE) 40 MG/ML suspension SHAKE LIQUID AND TAKE 20 ML(800 MG) BY MOUTH DAILY 12/20/18   Cresenzo-Dishmon, Joaquim Lai, CNM  naproxen (NAPROSYN) 500 MG tablet Take 1 tablet (500 mg total) by mouth 2 (two) times daily. 12/02/18   Fredia Sorrow, MD    Allergies    Patient has no known allergies.  Review of Systems   Review of Systems  Constitutional: Negative for chills and fever.    HENT: Positive for dental problem, ear pain and facial swelling. Negative for congestion, sinus pain, sore throat and trouble swallowing.   Respiratory: Negative for choking and shortness of breath.   Cardiovascular: Negative for chest pain.  Gastrointestinal: Negative for nausea and vomiting.  Skin: Negative for color change, rash and wound.  Neurological: Positive for headaches. Negative for dizziness and light-headedness.    Physical Exam Updated Vital Signs BP (!) 119/99 (BP Location: Right Arm)   Pulse 71   Temp 98.9 F (37.2 C) (Oral)   Resp 16   Ht 5' (1.524 m)   Wt 54.4 kg   LMP 08/05/2016   SpO2 99%   BMI 23.44 kg/m   Physical Exam Vitals and nursing note reviewed.  Constitutional:      General: She is not in acute distress.    Appearance: Normal appearance. She is not ill-appearing, toxic-appearing or diaphoretic.  HENT:     Head: Normocephalic and atraumatic.     Mouth/Throat:     Mouth: Mucous membranes are moist.     Pharynx: Oropharynx is clear. No oropharyngeal exudate.     Comments: Poor dentition throughout.  There are multiple cracked teeth.  Specifically the first right lower molar with surrounding gum erythema and swelling.  Airway is patent.  No palpable abscess or lymphadenopathy. Eyes:     Conjunctiva/sclera: Conjunctivae normal.  Pulmonary:     Effort: Pulmonary effort is normal.  Skin:    General: Skin is dry.  Neurological:     Mental Status: She is alert.  Psychiatric:        Mood and Affect: Mood normal.     ED Results / Procedures / Treatments   Labs (all labs ordered are listed, but only abnormal results are displayed) Labs Reviewed - No data to display  EKG None  Radiology No results found.  Procedures Procedures (including critical care time)  Medications Ordered in ED Medications  amoxicillin (AMOXIL) capsule 1,000 mg (has no administration in time range)  HYDROcodone-acetaminophen (NORCO/VICODIN) 5-325 MG per tablet 1  tablet (has no administration in time range)    ED Course  I have reviewed the triage vital signs and the nursing notes.  Pertinent labs & imaging results that were available during my care of the patient were reviewed by me and considered in my medical decision making (see chart for details).  Clinical Course as of Jul 28 911  Cecilio Asper Patient with dental pain  related to a cracked tooth.  Has some gum swelling but no abscess or airway compromise.  Given Norco and amoxicillin and resource dental guide for follow-up with dentist.  Advised on return precautions.   [KM]    Clinical Course User Index [KM] Kristine Royal   MDM Rules/Calculators/A&P                      Based on review of vitals, medical screening exam, lab work and/or imaging, there does not appear to be an acute, emergent etiology for the patient's symptoms. Counseled pt on good return precautions and encouraged both PCP and ED follow-up as needed.  Prior to discharge, I also discussed incidental imaging findings with patient in detail and advised appropriate, recommended follow-up in detail.  Clinical Impression: 1. Pain, dental     Disposition: Discharge  Prior to providing a prescription for a controlled substance, I independently reviewed the patient's recent prescription history on the [facility]. The patient had no recent or regular prescriptions and was deemed appropriate for a brief, less than 3 day prescription of narcotic for acute analgesia.  This note was prepared with assistance of Systems analyst. Occasional wrong-word or sound-a-like substitutions may have occurred due to the inherent limitations of voice recognition software.  Final Clinical Impression(s) / ED Diagnoses Final diagnoses:  Pain, dental    Rx / DC Orders ED Discharge Orders         Ordered    amoxicillin (AMOXIL) 500 MG capsule  3 times daily      07/28/19 0913    HYDROcodone-acetaminophen (NORCO/VICODIN) 5-325 MG tablet  Every 6 hours PRN     07/28/19 0913           Alveria Apley, PA-C 07/28/19 [id number]    Milton Ferguson, MD 07/28/19 [phone]

## 2019-07-28 NOTE — ED Triage Notes (Signed)
Patient c/o right lower dental pain that started Frday. Per patient broken teeth. Patient states low grade fever Friday but none since. Patient unable to make appointment due to weekend. Possibly abscessed tooth per patient. Patient using ibuprofen 200mg  and Kank +A with no relief.

## 2019-12-12 ENCOUNTER — Other Ambulatory Visit: Payer: Self-pay

## 2019-12-12 ENCOUNTER — Encounter (HOSPITAL_COMMUNITY): Payer: Self-pay

## 2019-12-12 ENCOUNTER — Emergency Department (HOSPITAL_COMMUNITY): Payer: Medicaid Other

## 2019-12-12 ENCOUNTER — Inpatient Hospital Stay (HOSPITAL_COMMUNITY)
Admission: EM | Admit: 2019-12-12 | Discharge: 2019-12-14 | DRG: 493 | Disposition: A | Discharge status: Home / Self Care | Payer: Medicaid Other | Attending: Internal Medicine | Admitting: Internal Medicine

## 2019-12-12 DIAGNOSIS — Y92838 Other recreation area as the place of occurrence of the external cause: Secondary | ICD-10-CM

## 2019-12-12 DIAGNOSIS — S199XXA Unspecified injury of neck, initial encounter: Secondary | ICD-10-CM | POA: Diagnosis not present

## 2019-12-12 DIAGNOSIS — E559 Vitamin D deficiency, unspecified: Secondary | ICD-10-CM | POA: Diagnosis present

## 2019-12-12 DIAGNOSIS — S82241A Displaced spiral fracture of shaft of right tibia, initial encounter for closed fracture: Secondary | ICD-10-CM | POA: Diagnosis not present

## 2019-12-12 DIAGNOSIS — Z830 Family history of human immunodeficiency virus [HIV] disease: Secondary | ICD-10-CM | POA: Diagnosis not present

## 2019-12-12 DIAGNOSIS — Z20822 Contact with and (suspected) exposure to covid-19: Secondary | ICD-10-CM | POA: Diagnosis present

## 2019-12-12 DIAGNOSIS — S3992XA Unspecified injury of lower back, initial encounter: Secondary | ICD-10-CM | POA: Diagnosis not present

## 2019-12-12 DIAGNOSIS — Y9351 Activity, roller skating (inline) and skateboarding: Secondary | ICD-10-CM

## 2019-12-12 DIAGNOSIS — Z833 Family history of diabetes mellitus: Secondary | ICD-10-CM

## 2019-12-12 DIAGNOSIS — F10129 Alcohol abuse with intoxication, unspecified: Secondary | ICD-10-CM | POA: Diagnosis present

## 2019-12-12 DIAGNOSIS — W1839XA Other fall on same level, initial encounter: Secondary | ICD-10-CM | POA: Diagnosis present

## 2019-12-12 DIAGNOSIS — S82831A Other fracture of upper and lower end of right fibula, initial encounter for closed fracture: Secondary | ICD-10-CM | POA: Diagnosis not present

## 2019-12-12 DIAGNOSIS — R519 Headache, unspecified: Secondary | ICD-10-CM | POA: Diagnosis not present

## 2019-12-12 DIAGNOSIS — Z801 Family history of malignant neoplasm of trachea, bronchus and lung: Secondary | ICD-10-CM

## 2019-12-12 DIAGNOSIS — S99922A Unspecified injury of left foot, initial encounter: Secondary | ICD-10-CM | POA: Diagnosis not present

## 2019-12-12 DIAGNOSIS — Y906 Blood alcohol level of 120-199 mg/100 ml: Secondary | ICD-10-CM | POA: Diagnosis present

## 2019-12-12 DIAGNOSIS — R079 Chest pain, unspecified: Secondary | ICD-10-CM | POA: Diagnosis not present

## 2019-12-12 DIAGNOSIS — Z87891 Personal history of nicotine dependence: Secondary | ICD-10-CM

## 2019-12-12 DIAGNOSIS — D62 Acute posthemorrhagic anemia: Secondary | ICD-10-CM | POA: Diagnosis not present

## 2019-12-12 DIAGNOSIS — T148XXA Other injury of unspecified body region, initial encounter: Secondary | ICD-10-CM

## 2019-12-12 DIAGNOSIS — M898X9 Other specified disorders of bone, unspecified site: Secondary | ICD-10-CM | POA: Diagnosis present

## 2019-12-12 DIAGNOSIS — Z419 Encounter for procedure for purposes other than remedying health state, unspecified: Secondary | ICD-10-CM

## 2019-12-12 DIAGNOSIS — C719 Malignant neoplasm of brain, unspecified: Secondary | ICD-10-CM | POA: Diagnosis present

## 2019-12-12 DIAGNOSIS — Z8249 Family history of ischemic heart disease and other diseases of the circulatory system: Secondary | ICD-10-CM | POA: Diagnosis not present

## 2019-12-12 DIAGNOSIS — R102 Pelvic and perineal pain: Secondary | ICD-10-CM | POA: Diagnosis not present

## 2019-12-12 DIAGNOSIS — I10 Essential (primary) hypertension: Secondary | ICD-10-CM | POA: Diagnosis not present

## 2019-12-12 DIAGNOSIS — D573 Sickle-cell trait: Secondary | ICD-10-CM | POA: Diagnosis not present

## 2019-12-12 DIAGNOSIS — Z809 Family history of malignant neoplasm, unspecified: Secondary | ICD-10-CM | POA: Diagnosis not present

## 2019-12-12 DIAGNOSIS — Z820 Family history of epilepsy and other diseases of the nervous system: Secondary | ICD-10-CM | POA: Diagnosis not present

## 2019-12-12 DIAGNOSIS — S0990XA Unspecified injury of head, initial encounter: Secondary | ICD-10-CM | POA: Diagnosis not present

## 2019-12-12 DIAGNOSIS — S8261XA Displaced fracture of lateral malleolus of right fibula, initial encounter for closed fracture: Secondary | ICD-10-CM | POA: Diagnosis present

## 2019-12-12 DIAGNOSIS — S82201A Unspecified fracture of shaft of right tibia, initial encounter for closed fracture: Secondary | ICD-10-CM | POA: Diagnosis not present

## 2019-12-12 DIAGNOSIS — G8918 Other acute postprocedural pain: Secondary | ICD-10-CM | POA: Diagnosis not present

## 2019-12-12 DIAGNOSIS — S3993XA Unspecified injury of pelvis, initial encounter: Secondary | ICD-10-CM | POA: Diagnosis not present

## 2019-12-12 DIAGNOSIS — S299XXA Unspecified injury of thorax, initial encounter: Secondary | ICD-10-CM | POA: Diagnosis not present

## 2019-12-12 DIAGNOSIS — M545 Low back pain: Secondary | ICD-10-CM | POA: Diagnosis not present

## 2019-12-12 DIAGNOSIS — I639 Cerebral infarction, unspecified: Secondary | ICD-10-CM

## 2019-12-12 DIAGNOSIS — S82251A Displaced comminuted fracture of shaft of right tibia, initial encounter for closed fracture: Secondary | ICD-10-CM | POA: Insufficient documentation

## 2019-12-12 DIAGNOSIS — M542 Cervicalgia: Secondary | ICD-10-CM | POA: Diagnosis not present

## 2019-12-12 LAB — CBC
HCT: 38.1 % (ref 36.0–46.0)
Hemoglobin: 13.5 g/dL (ref 12.0–15.0)
MCH: 33.8 pg (ref 26.0–34.0)
MCHC: 35.4 g/dL (ref 30.0–36.0)
MCV: 95.5 fL (ref 80.0–100.0)
Platelets: 216 10*3/uL (ref 150–400)
RBC: 3.99 MIL/uL (ref 3.87–5.11)
RDW: 12 % (ref 11.5–15.5)
WBC: 8.2 10*3/uL (ref 4.0–10.5)
nRBC: 0 % (ref 0.0–0.2)

## 2019-12-12 LAB — I-STAT BETA HCG BLOOD, ED (MC, WL, AP ONLY): I-stat hCG, quantitative: <5 m[IU]/mL (ref <5)

## 2019-12-12 MED ORDER — FENTANYL CITRATE (PF) 100 MCG/2ML IJ SOLN
INTRAMUSCULAR | Status: AC
  Administered 2019-12-12: 100 ug via INTRAVENOUS
  Filled 2019-12-12: qty 2

## 2019-12-12 MED ORDER — FENTANYL CITRATE (PF) 100 MCG/2ML IJ SOLN: 100.0000 ug | Freq: Once | INTRAMUSCULAR | Status: AC

## 2019-12-12 NOTE — ED Triage Notes (Addendum)
Pt was at roller rink, had just put skates on when she fell.  Pt having pain to right lower leg, splinted by ems.  Pt received 75 mcg of fentanyl for pain per ems.

## 2019-12-12 NOTE — Consult Note (Signed)
Orthopedics consulted for Right tibial shaft fracture with associated fibula fracutre.  If patient has no other medical or trauma injuries can be transferred to [facility] and admitted to orthopedics.  If has other injuries would need trauma admission.  Plan for IMN R tibia Friday.  NPO at midnight.

## 2019-12-13 ENCOUNTER — Encounter (HOSPITAL_COMMUNITY): Payer: Self-pay | Admitting: Internal Medicine

## 2019-12-13 ENCOUNTER — Inpatient Hospital Stay (HOSPITAL_COMMUNITY): Payer: Medicaid Other

## 2019-12-13 ENCOUNTER — Encounter (HOSPITAL_COMMUNITY)
Admission: EM | Disposition: A | Discharge status: Home / Self Care | Payer: Self-pay | Source: Home / Self Care | Attending: Internal Medicine

## 2019-12-13 ENCOUNTER — Inpatient Hospital Stay (HOSPITAL_COMMUNITY): Payer: Medicaid Other | Admitting: Anesthesiology

## 2019-12-13 DIAGNOSIS — S82241D Displaced spiral fracture of shaft of right tibia, subsequent encounter for closed fracture with routine healing: Secondary | ICD-10-CM

## 2019-12-13 DIAGNOSIS — S82831A Other fracture of upper and lower end of right fibula, initial encounter for closed fracture: Secondary | ICD-10-CM

## 2019-12-13 DIAGNOSIS — G9389 Other specified disorders of brain: Secondary | ICD-10-CM

## 2019-12-13 DIAGNOSIS — S82241A Displaced spiral fracture of shaft of right tibia, initial encounter for closed fracture: Principal | ICD-10-CM

## 2019-12-13 DIAGNOSIS — F10129 Alcohol abuse with intoxication, unspecified: Secondary | ICD-10-CM | POA: Diagnosis present

## 2019-12-13 HISTORY — PX: TIBIA IM NAIL INSERTION: SHX2516

## 2019-12-13 LAB — VITAMIN D 25 HYDROXY (VIT D DEFICIENCY, FRACTURES): Vit D, 25-Hydroxy: 9.99 ng/mL — ABNORMAL LOW (ref 30–100)

## 2019-12-13 LAB — COMPREHENSIVE METABOLIC PANEL
ALT: 22 U/L (ref 0–44)
AST: 20 U/L (ref 15–41)
Albumin: 4.5 g/dL (ref 3.5–5.0)
Alkaline Phosphatase: 72 U/L (ref 38–126)
Anion gap: 12 (ref 5–15)
BUN: 8 mg/dL (ref 6–20)
CO2: 23 mmol/L (ref 22–32)
Calcium: 9 mg/dL (ref 8.9–10.3)
Chloride: 106 mmol/L (ref 98–111)
Creatinine, Ser: 0.59 mg/dL (ref 0.44–1.00)
GFR calc Af Amer: >60 mL/min (ref >60)
GFR calc non Af Amer: >60 mL/min (ref >60)
Glucose, Bld: 115 mg/dL — ABNORMAL HIGH (ref 70–99)
Potassium: 3.7 mmol/L (ref 3.5–5.1)
Sodium: 141 mmol/L (ref 135–145)
Total Bilirubin: 0.2 mg/dL — ABNORMAL LOW (ref 0.3–1.2)
Total Protein: 7.6 g/dL (ref 6.5–8.1)

## 2019-12-13 LAB — ETHANOL: Alcohol, Ethyl (B): 189 mg/dL — ABNORMAL HIGH (ref <10)

## 2019-12-13 LAB — SURGICAL PCR SCREEN
MRSA, PCR: NEGATIVE
Staphylococcus aureus: POSITIVE — AB

## 2019-12-13 LAB — SARS CORONAVIRUS 2 BY RT PCR (HOSPITAL ORDER, PERFORMED IN ~~LOC~~ HOSPITAL LAB): SARS Coronavirus 2: NEGATIVE

## 2019-12-13 LAB — PROTIME-INR
INR: 1 (ref 0.8–1.2)
Prothrombin Time: 13.1 seconds (ref 11.4–15.2)

## 2019-12-13 LAB — HIV ANTIBODY (ROUTINE TESTING W REFLEX): HIV Screen 4th Generation wRfx: NONREACTIVE

## 2019-12-13 SURGERY — INSERTION, INTRAMEDULLARY ROD, TIBIA
Anesthesia: General | Laterality: Right

## 2019-12-13 MED ORDER — HYDROMORPHONE HCL 1 MG/ML IJ SOLN
0.5000 mg | INTRAMUSCULAR | Status: DC | PRN
  Administered 2019-12-13: 1 mg via INTRAVENOUS
  Filled 2019-12-13: qty 1

## 2019-12-13 MED ORDER — MIDAZOLAM HCL 2 MG/2ML IJ SOLN
INTRAMUSCULAR | Status: DC | PRN
  Administered 2019-12-13: 2 mg via INTRAVENOUS

## 2019-12-13 MED ORDER — FENTANYL CITRATE (PF) 100 MCG/2ML IJ SOLN
INTRAMUSCULAR | Status: AC
  Administered 2019-12-13: 50 ug via INTRAVENOUS
  Filled 2019-12-13: qty 2

## 2019-12-13 MED ORDER — MUPIROCIN 2 % EX OINT
1.0000 "application " | TOPICAL_OINTMENT | Freq: Two times a day (BID) | CUTANEOUS | Status: DC
  Administered 2019-12-13: 1 via NASAL
  Filled 2019-12-13: qty 22

## 2019-12-13 MED ORDER — LIDOCAINE 2% (20 MG/ML) 5 ML SYRINGE
INTRAMUSCULAR | Status: DC | PRN
  Administered 2019-12-13: 60 mg via INTRAVENOUS

## 2019-12-13 MED ORDER — FENTANYL CITRATE (PF) 100 MCG/2ML IJ SOLN
INTRAMUSCULAR | Status: DC | PRN
  Administered 2019-12-13: 100 ug via INTRAVENOUS

## 2019-12-13 MED ORDER — GADOBUTROL 1 MMOL/ML IV SOLN
5.0000 mL | Freq: Once | INTRAVENOUS | Status: AC | PRN
  Administered 2019-12-13: 5 mL via INTRAVENOUS

## 2019-12-13 MED ORDER — ROCURONIUM BROMIDE 10 MG/ML (PF) SYRINGE
PREFILLED_SYRINGE | INTRAVENOUS | Status: DC | PRN
  Administered 2019-12-13: 20 mg via INTRAVENOUS
  Administered 2019-12-13: 50 mg via INTRAVENOUS
  Administered 2019-12-13: 10 mg via INTRAVENOUS

## 2019-12-13 MED ORDER — METHOCARBAMOL 1000 MG/10ML IJ SOLN
500.0000 mg | Freq: Four times a day (QID) | INTRAVENOUS | Status: DC | PRN
  Filled 2019-12-13: qty 5

## 2019-12-13 MED ORDER — ONDANSETRON HCL 4 MG/2ML IJ SOLN
4.0000 mg | Freq: Four times a day (QID) | INTRAMUSCULAR | Status: DC | PRN
  Administered 2019-12-13: 4 mg via INTRAVENOUS
  Filled 2019-12-13: qty 2

## 2019-12-13 MED ORDER — DOCUSATE SODIUM 100 MG PO CAPS
100.0000 mg | ORAL_CAPSULE | Freq: Two times a day (BID) | ORAL | Status: DC
  Administered 2019-12-13 – 2019-12-14 (×2): 100 mg via ORAL
  Filled 2019-12-13 (×2): qty 1

## 2019-12-13 MED ORDER — PROMETHAZINE HCL 25 MG/ML IJ SOLN: 6.2500 mg | INTRAMUSCULAR | Status: DC | PRN

## 2019-12-13 MED ORDER — SENNOSIDES-DOCUSATE SODIUM 8.6-50 MG PO TABS: 1.0000 | ORAL_TABLET | Freq: Every evening | ORAL | Status: DC | PRN

## 2019-12-13 MED ORDER — VANCOMYCIN HCL 1000 MG IV SOLR
INTRAVENOUS | Status: DC | PRN
  Administered 2019-12-13: 1000 mg via TOPICAL

## 2019-12-13 MED ORDER — ASPIRIN 325 MG PO TABS
325.0000 mg | ORAL_TABLET | Freq: Two times a day (BID) | ORAL | Status: DC
  Administered 2019-12-14: 325 mg via ORAL
  Filled 2019-12-13: qty 1

## 2019-12-13 MED ORDER — 0.9 % SODIUM CHLORIDE (POUR BTL) OPTIME
TOPICAL | Status: DC | PRN
  Administered 2019-12-13: 1000 mL

## 2019-12-13 MED ORDER — FENTANYL CITRATE (PF) 100 MCG/2ML IJ SOLN
25.0000 ug | INTRAMUSCULAR | Status: DC | PRN
  Administered 2019-12-13: 50 ug via INTRAVENOUS

## 2019-12-13 MED ORDER — ONDANSETRON HCL 4 MG PO TABS: 4.0000 mg | ORAL_TABLET | Freq: Four times a day (QID) | ORAL | Status: DC | PRN

## 2019-12-13 MED ORDER — OXYCODONE HCL 5 MG PO TABS
5.0000 mg | ORAL_TABLET | ORAL | Status: DC | PRN
  Administered 2019-12-13 – 2019-12-14 (×2): 10 mg via ORAL
  Administered 2019-12-14 (×2): 5 mg via ORAL
  Filled 2019-12-13: qty 1
  Filled 2019-12-13: qty 2
  Filled 2019-12-13: qty 1
  Filled 2019-12-13: qty 2

## 2019-12-13 MED ORDER — CEFAZOLIN SODIUM-DEXTROSE 2-3 GM-%(50ML) IV SOLR
INTRAVENOUS | Status: DC | PRN
  Administered 2019-12-13: 2 g via INTRAVENOUS

## 2019-12-13 MED ORDER — CEFAZOLIN SODIUM-DEXTROSE 2-4 GM/100ML-% IV SOLN
2.0000 g | Freq: Three times a day (TID) | INTRAVENOUS | Status: AC
  Administered 2019-12-13 – 2019-12-14 (×3): 2 g via INTRAVENOUS
  Filled 2019-12-13 (×3): qty 100

## 2019-12-13 MED ORDER — PROPOFOL 10 MG/ML IV BOLUS
INTRAVENOUS | Status: DC | PRN
  Administered 2019-12-13: 100 mg via INTRAVENOUS

## 2019-12-13 MED ORDER — DIPHENHYDRAMINE HCL 12.5 MG/5ML PO ELIX: 12.5000 mg | ORAL_SOLUTION | ORAL | Status: DC | PRN

## 2019-12-13 MED ORDER — ROCURONIUM BROMIDE 10 MG/ML (PF) SYRINGE
PREFILLED_SYRINGE | INTRAVENOUS | Status: AC
  Filled 2019-12-13: qty 20

## 2019-12-13 MED ORDER — VANCOMYCIN HCL 1000 MG IV SOLR
INTRAVENOUS | Status: AC
  Filled 2019-12-13: qty 1000

## 2019-12-13 MED ORDER — OXYCODONE HCL 5 MG/5ML PO SOLN: 5.0000 mg | Freq: Once | ORAL | Status: DC | PRN

## 2019-12-13 MED ORDER — ORAL CARE MOUTH RINSE: 15.0000 mL | Freq: Once | OROMUCOSAL | Status: AC

## 2019-12-13 MED ORDER — BUPIVACAINE-EPINEPHRINE (PF) 0.5% -1:200000 IJ SOLN
INTRAMUSCULAR | Status: DC | PRN
  Administered 2019-12-13: 10 mL via PERINEURAL
  Administered 2019-12-13: 20 mL via PERINEURAL

## 2019-12-13 MED ORDER — LIDOCAINE 2% (20 MG/ML) 5 ML SYRINGE
INTRAMUSCULAR | Status: AC
  Filled 2019-12-13: qty 5

## 2019-12-13 MED ORDER — MORPHINE SULFATE (PF) 2 MG/ML IV SOLN: 2.0000 mg | INTRAVENOUS | Status: DC | PRN

## 2019-12-13 MED ORDER — DEXAMETHASONE SODIUM PHOSPHATE 10 MG/ML IJ SOLN
INTRAMUSCULAR | Status: DC | PRN
Start: 2019-12-13 — End: 2019-12-13
  Administered 2019-12-13: 8 mg via INTRAVENOUS

## 2019-12-13 MED ORDER — MUPIROCIN 2 % EX OINT
1.0000 "application " | TOPICAL_OINTMENT | Freq: Two times a day (BID) | CUTANEOUS | Status: DC
  Administered 2019-12-13 – 2019-12-14 (×2): 1 via NASAL

## 2019-12-13 MED ORDER — ONDANSETRON HCL 4 MG/2ML IJ SOLN
INTRAMUSCULAR | Status: DC | PRN
  Administered 2019-12-13: 4 mg via INTRAVENOUS

## 2019-12-13 MED ORDER — ACETAMINOPHEN 500 MG PO TABS
1000.0000 mg | ORAL_TABLET | Freq: Three times a day (TID) | ORAL | Status: DC
  Administered 2019-12-13 – 2019-12-14 (×2): 1000 mg via ORAL
  Filled 2019-12-13 (×2): qty 2

## 2019-12-13 MED ORDER — SUGAMMADEX SODIUM 200 MG/2ML IV SOLN
INTRAVENOUS | Status: DC | PRN
  Administered 2019-12-13: 120 mg via INTRAVENOUS

## 2019-12-13 MED ORDER — IBUPROFEN 200 MG PO TABS
400.0000 mg | ORAL_TABLET | Freq: Four times a day (QID) | ORAL | Status: DC | PRN
  Administered 2019-12-13: 400 mg via ORAL
  Filled 2019-12-13: qty 2

## 2019-12-13 MED ORDER — OXYCODONE HCL 5 MG PO TABS
10.0000 mg | ORAL_TABLET | ORAL | Status: DC | PRN
  Administered 2019-12-13: 15 mg via ORAL
  Filled 2019-12-13: qty 3

## 2019-12-13 MED ORDER — OXYCODONE HCL 5 MG PO TABS: 5.0000 mg | ORAL_TABLET | Freq: Once | ORAL | Status: DC | PRN

## 2019-12-13 MED ORDER — ONDANSETRON HCL 4 MG/2ML IJ SOLN: 4.0000 mg | Freq: Four times a day (QID) | INTRAMUSCULAR | Status: DC | PRN

## 2019-12-13 MED ORDER — FENTANYL CITRATE (PF) 250 MCG/5ML IJ SOLN
INTRAMUSCULAR | Status: AC
  Filled 2019-12-13: qty 5

## 2019-12-13 MED ORDER — HYDROCODONE-ACETAMINOPHEN 5-325 MG PO TABS: 1.0000 | ORAL_TABLET | ORAL | Status: DC | PRN

## 2019-12-13 MED ORDER — METHOCARBAMOL 500 MG PO TABS
500.0000 mg | ORAL_TABLET | Freq: Four times a day (QID) | ORAL | Status: DC | PRN
  Administered 2019-12-13 (×2): 500 mg via ORAL
  Filled 2019-12-13 (×2): qty 1

## 2019-12-13 MED ORDER — SODIUM CHLORIDE 0.9 % IV BOLUS (SEPSIS)
1000.0000 mL | Freq: Once | INTRAVENOUS | Status: AC
  Administered 2019-12-13: 1000 mL via INTRAVENOUS

## 2019-12-13 MED ORDER — CHLORHEXIDINE GLUCONATE CLOTH 2 % EX PADS
6.0000 | MEDICATED_PAD | Freq: Every day | CUTANEOUS | Status: DC
  Administered 2019-12-13: 6 via TOPICAL

## 2019-12-13 MED ORDER — ACETAMINOPHEN 500 MG PO TABS
1000.0000 mg | ORAL_TABLET | Freq: Three times a day (TID) | ORAL | Status: DC
  Administered 2019-12-13: 1000 mg via ORAL
  Filled 2019-12-13: qty 2

## 2019-12-13 MED ORDER — LACTATED RINGERS IV SOLN: INTRAVENOUS | Status: DC

## 2019-12-13 MED ORDER — HYDROMORPHONE HCL 1 MG/ML IJ SOLN: 1.0000 mg | INTRAMUSCULAR | Status: DC | PRN

## 2019-12-13 MED ORDER — CHLORHEXIDINE GLUCONATE 0.12 % MT SOLN
15.0000 mL | Freq: Once | OROMUCOSAL | Status: AC
  Administered 2019-12-13: 15 mL via OROMUCOSAL
  Filled 2019-12-13: qty 15

## 2019-12-13 MED ORDER — MIDAZOLAM HCL 2 MG/2ML IJ SOLN
INTRAMUSCULAR | Status: AC
  Filled 2019-12-13: qty 2

## 2019-12-13 MED ORDER — SODIUM CHLORIDE 0.9 % IV SOLN: INTRAVENOUS | Status: DC

## 2019-12-13 MED ORDER — PHENYLEPHRINE 40 MCG/ML (10ML) SYRINGE FOR IV PUSH (FOR BLOOD PRESSURE SUPPORT)
PREFILLED_SYRINGE | INTRAVENOUS | Status: DC | PRN
  Administered 2019-12-13: 160 ug via INTRAVENOUS
  Administered 2019-12-13: 80 ug via INTRAVENOUS
  Administered 2019-12-13: 120 ug via INTRAVENOUS
  Administered 2019-12-13: 80 ug via INTRAVENOUS

## 2019-12-13 SURGICAL SUPPLY — 60 items
APL PRP STRL LF DISP 70% ISPRP (MISCELLANEOUS) ×1
BIT DRILL CAL 3.2 LONG (BIT) ×1 IMPLANT
BIT DRILL FLUTED 3.2X145 SHORT (BIT) ×1 IMPLANT
BIT DRILL SHORT 3.2MM (DRILL) IMPLANT
BLADE SURG 10 STRL SS (BLADE) ×4 IMPLANT
BNDG COHESIVE 4X5 TAN STRL (GAUZE/BANDAGES/DRESSINGS) ×2 IMPLANT
BNDG ELASTIC 4X5.8 VLCR STR LF (GAUZE/BANDAGES/DRESSINGS) ×2 IMPLANT
BNDG ELASTIC 6X5.8 VLCR STR LF (GAUZE/BANDAGES/DRESSINGS) ×2 IMPLANT
BNDG GAUZE ELAST 4 BULKY (GAUZE/BANDAGES/DRESSINGS) ×2 IMPLANT
BRUSH SCRUB EZ PLAIN DRY (MISCELLANEOUS) ×4 IMPLANT
CHLORAPREP W/TINT 26 (MISCELLANEOUS) ×2 IMPLANT
CLSR STERI-STRIP ANTIMIC 1/2X4 (GAUZE/BANDAGES/DRESSINGS) ×1 IMPLANT
COVER SURGICAL LIGHT HANDLE (MISCELLANEOUS) ×4 IMPLANT
COVER WAND RF STERILE (DRAPES) ×2 IMPLANT
DRAPE C-ARM 42X72 X-RAY (DRAPES) ×2 IMPLANT
DRAPE C-ARMOR (DRAPES) ×2 IMPLANT
DRAPE HALF SHEET 40X57 (DRAPES) ×4 IMPLANT
DRAPE IMP U-DRAPE 54X76 (DRAPES) ×4 IMPLANT
DRAPE INCISE IOBAN 66X45 STRL (DRAPES) IMPLANT
DRAPE ORTHO SPLIT 77X108 STRL (DRAPES) ×4
DRAPE SURG ORHT 6 SPLT 77X108 (DRAPES) ×2 IMPLANT
DRAPE U-SHAPE 47X51 STRL (DRAPES) ×2 IMPLANT
DRILL SHORT 3.2MM (DRILL) ×2
DRSG ADAPTIC 3X8 NADH LF (GAUZE/BANDAGES/DRESSINGS) ×2 IMPLANT
ELECT REM PT RETURN 9FT ADLT (ELECTROSURGICAL) ×2
ELECTRODE REM PT RTRN 9FT ADLT (ELECTROSURGICAL) ×1 IMPLANT
GAUZE SPONGE 4X4 12PLY STRL (GAUZE/BANDAGES/DRESSINGS) ×2 IMPLANT
GLOVE BIO SURGEON STRL SZ 6.5 (GLOVE) ×6 IMPLANT
GLOVE BIO SURGEON STRL SZ7.5 (GLOVE) ×8 IMPLANT
GLOVE BIOGEL PI IND STRL 6.5 (GLOVE) ×1 IMPLANT
GLOVE BIOGEL PI IND STRL 7.5 (GLOVE) ×1 IMPLANT
GLOVE BIOGEL PI INDICATOR 6.5 (GLOVE) ×1
GLOVE BIOGEL PI INDICATOR 7.5 (GLOVE) ×1
GOWN STRL REUS W/ TWL LRG LVL3 (GOWN DISPOSABLE) ×2 IMPLANT
GOWN STRL REUS W/TWL LRG LVL3 (GOWN DISPOSABLE) ×4
GUIDEWIRE 3.2X400 (WIRE) ×1 IMPLANT
KIT BASIN OR (CUSTOM PROCEDURE TRAY) ×2 IMPLANT
KIT TURNOVER KIT B (KITS) ×2 IMPLANT
NAIL TIBIAL EX 9X330MM (Nail) ×1 IMPLANT
PACK TOTAL JOINT (CUSTOM PROCEDURE TRAY) ×2 IMPLANT
PAD ARMBOARD 7.5X6 YLW CONV (MISCELLANEOUS) ×4 IMPLANT
PAD CAST 4YDX4 CTTN HI CHSV (CAST SUPPLIES) IMPLANT
PADDING CAST COTTON 4X4 STRL (CAST SUPPLIES) ×2
PADDING CAST COTTON 6X4 STRL (CAST SUPPLIES) ×1 IMPLANT
REAMER ROD DEEP FLUTE 2.5X950 (INSTRUMENTS) ×1 IMPLANT
SCREW LOCK 4X30 TI (Screw) ×1 IMPLANT
SCREW LOCK 4X66 TI (Screw) ×1 IMPLANT
SCREW LOCKING 4.0 28MM (Screw) ×1 IMPLANT
SCREW LOCKING 4X38 (Screw) ×1 IMPLANT
SPLINT PLASTER CAST XFAST 5X30 (CAST SUPPLIES) IMPLANT
SPLINT PLASTER XFAST SET 5X30 (CAST SUPPLIES) ×1
STAPLER VISISTAT 35W (STAPLE) ×2 IMPLANT
SUT MNCRL AB 3-0 PS2 18 (SUTURE) ×2 IMPLANT
SUT VIC AB 0 CT1 27 (SUTURE)
SUT VIC AB 0 CT1 27XBRD ANBCTR (SUTURE) IMPLANT
SUT VIC AB 2-0 CT1 27 (SUTURE)
SUT VIC AB 2-0 CT1 TAPERPNT 27 (SUTURE) IMPLANT
TOWEL GREEN STERILE (TOWEL DISPOSABLE) ×4 IMPLANT
TOWEL GREEN STERILE FF (TOWEL DISPOSABLE) ×2 IMPLANT
YANKAUER SUCT BULB TIP NO VENT (SUCTIONS) IMPLANT

## 2019-12-13 NOTE — Progress Notes (Addendum)
  Patient seen and examined personally, I reviewed the chart, history and physical and admission note, done by admitting physician this morning and agree with the same with following addendum.  Please refer to the morning admission note for more detailed plan of care.  Briefly, 39 year old female history of pregnancy-induced hypertension, sickle cell trait, dysmenorrhea brought to the ED after she fell from a roller rink after she slipped, landed on her right leg, unable to stand up and walk due to intractable pain, apparently she passed out remembers waking up in the hospital.  No reported history of seizure. Patient underwent Blood work showed WBC 8.2, hemoglobin 13.5, sodium 141, potassium 3.7, BUN 8, creatinine 0.59, blood glucose 115 call level is 189 and Covid 19 test negative.  X-ray right ankle showed spiral fracture right tibia and fibula.  X-ray of right and left femur negative for fractures.  X-ray lumbar spine and pelvis also negative for fracture.  CT scan of head showed an area of decreased attenuation in left frontal lobe suspicious of acute to subacute ischemia.  ED physician discussed the CT report with radiology and neurology and they did not felt that the CT changes due to traumatic injury.  Neurologist Dr. Malen Gauze recommended MRI brain with and without contrast and neurology consultation.  Spiral fracture right tibia-fibula: Orthopedic on consult planning for operative intervention once okay with neurology.  Notified Dr. Cheral Marker from neurology who will consult.  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  Neurology is consulted.  Anticipating outpatient work-up and follow-up.  Alcohol use with etoh level  at 189: Patient alcohol level 189 on arrival, was awake alert and oriented no signs of intoxication or withdrawal.  Alcohol cessation has been advised

## 2019-12-13 NOTE — Anesthesia Procedure Notes (Signed)
Anesthesia Regional Block: Adductor canal block   Pre-Anesthetic Checklist: ,, timeout performed, Correct Patient, Correct Site, Correct Laterality, Correct Procedure, Correct Position, site marked, Risks and benefits discussed, pre-op evaluation,  At surgeon's request and post-op pain management  Laterality: Right  Prep: Maximum Sterile Barrier Precautions used, chloraprep       Needles:  Injection technique: Single-shot  Needle Type: Echogenic Stimulator Needle     Needle Length: 9cm  Needle Gauge: 22     Additional Needles:   Procedures:,,,, ultrasound used (permanent image in chart),,,,  Narrative:  Start time: 12/13/2019 12:32 PM End time: 12/13/2019 12:34 PM Injection made incrementally with aspirations every 5 mL.  Performed by: Personally  Anesthesiologist: Brennan Bailey, MD  Additional Notes: Risks, benefits, and alternative discussed. Patient gave consent for procedure. Patient prepped and draped in sterile fashion. Sedation administered, patient remains easily responsive to voice. Relevant anatomy identified with ultrasound guidance. Local anesthetic given in 5cc increments with no signs or symptoms of intravascular injection. No pain or paraesthesias with injection. Patient monitored throughout procedure with signs of LAST or immediate complications. Tolerated well. Ultrasound image placed in chart.  Tawny Asal, MD

## 2019-12-13 NOTE — Anesthesia Postprocedure Evaluation (Signed)
Anesthesia Post Note  Patient: Maureen Key  Procedure(s) Performed: INTRAMEDULLARY (IM) NAIL TIBIAL (Right )     Patient location during evaluation: PACU Anesthesia Type: General Level of consciousness: awake and alert and oriented Pain management: pain level controlled Vital Signs Assessment: post-procedure vital signs reviewed and stable Respiratory status: spontaneous breathing, nonlabored ventilation and respiratory function stable Cardiovascular status: blood pressure returned to baseline Postop Assessment: no apparent nausea or vomiting Anesthetic complications: no    Last Vitals:  Vitals:   12/13/19 1430 12/13/19 1445  BP: 118/80   Pulse: 65   Resp: 15   Temp:    SpO2: 100% 100%    Last Pain:  Vitals:   12/13/19 1420  TempSrc:   PainSc: [name]

## 2019-12-13 NOTE — Consult Note (Addendum)
ORTHOPAEDIC CONSULTATION  REQUESTING PHYSICIAN: Antonieta Pert, MD  Chief Complaint: right leg pain  HPI: Maureen Key is a 39 y.o. female with medical history significant of pregnancy-induced hypertension, reported coma after pregnancy, sickle cell trait and dysmenorrhea is brought to ED by EMS for evaluation of worsening pain in her right lower leg. She was at a roller rink last night and had an injury causing her to land on her right leg. She was unable to bear weight after the injury. She had pain all over after the injury, but worse on her lower leg. She reported losing consciousness. Work-up in the ED showed distal tib/fib fractures. Orthopedics was consulted. CT scan of head showed an area of decreased attenuation in left frontal lobe suspicious of acute to subacute ischemia. Neurology was consulted and recommended MRI w and w/o contrast. Neurology consult pending.  This morning, patient is resting comfortably in her hospital bed. She complains mostly of right lower extremity pain. She denies any previous orthopedic injuries. She normally ambulates without any assistance. She has 4 kids at home who are all under the age of 43. She is anxious about surgery and her recovery. Pain has improved with IV medications.   Past Medical History:  Diagnosis Date  . Blood transfusion without reported diagnosis    last [place] delivery  . Dysmenorrhea   . History of placenta accreta in prior pregnancy, currently pregnant   . History of postpartum hemorrhage, currently pregnant   . History of sepsis   . Pregnancy induced hypertension   . Sickle cell trait ([place])   . Trichimoniasis   . Vaginal Pap smear, abnormal    Past Surgical History:  Procedure Laterality Date  . ABDOMINAL HYSTERECTOMY  05/12/2017   Procedure: HYSTERECTOMY ABDOMINAL;  Surgeon: Jonnie Kind, MD;  Location: [facility];  Service: Obstetrics;;  . CERVICAL CONE BIOPSY    . CESAREAN SECTION    . CESAREAN SECTION  N/A 05/12/2017   Procedure: CESAREAN SECTION WITH Hysterectomy;  Surgeon: Jonnie Kind, MD;  Location: [facility];  Service: Obstetrics;  Laterality: N/A;   Social History   Socioeconomic History  . Marital status: Single    Spouse name: Not on file  . Number of children: 4  . Years of education: Not on file  . Highest education level: Not on file  Occupational History  . Not on file  Tobacco Use  . Smoking status: Former Smoker    Packs/day: 0.25    Quit date: 03/08/2017    Years since quitting: 2.7  . Smokeless tobacco: Never Used  . Tobacco comment: 2 a day  Substance and Sexual Activity  . Alcohol use: No  . Drug use: Yes    Types: Marijuana    Comment: last used 04/09/17  . Sexual activity: Yes    Birth control/protection: None, Surgical  Other Topics Concern  . Not on file  Social History Narrative  . Not on file   Social Determinants of Health   Financial Resource Strain:   . Difficulty of Paying Living Expenses:   Food Insecurity:   . Worried About Charity fundraiser in the Last Year:   . Arboriculturist in the Last Year:   Transportation Needs:   . Film/video editor (Medical):   Marland Kitchen Lack of Transportation (Non-Medical):   Physical Activity:   . Days of Exercise per Week:   . Minutes of Exercise per Session:   Stress:   .  Feeling of Stress :   Social Connections:   . Frequency of Communication with Friends and Family:   . Frequency of Social Gatherings with Friends and Family:   . Attends Religious Services:   . Active Member of Clubs or Organizations:   . Attends Archivist Meetings:   Marland Kitchen Marital Status:    Family History  Problem Relation Age of Onset  . Hypertension Mother   . HIV/AIDS Father   . Cancer Father        lung  . Diabetes Sister   . Diabetes Maternal Aunt   . Diabetes Maternal Uncle   . Cancer Paternal Aunt   . Cancer Paternal Uncle   . Diabetes Maternal Grandmother   . Alzheimer's disease Paternal  Grandmother    No Known Allergies Prior to Admission medications   Medication Sig Start Date End Date Taking? Authorizing Provider  megestrol (MEGACE) 40 MG tablet 3 tablets a day for 5 days, 2 tablets a day for 5 days then 1 tablet daily 12/18/18   Florian Buff, MD  megestrol (MEGACE) 40 MG tablet 1 tablet daily 01/27/19   Florian Buff, MD  megestrol (MEGACE) 40 MG/ML suspension SHAKE LIQUID AND TAKE 20 ML(800 MG) BY MOUTH DAILY 12/20/18   Christin Fudge, CNM   DG Chest 1 View  Result Date: 12/13/2019 CLINICAL DATA:  Fall while roller-skating with chest pain, initial encounter EXAM: CHEST  1 VIEW COMPARISON:  08/12/04 FINDINGS: The heart size and mediastinal contours are within normal limits. Both lungs are clear. The visualized skeletal structures are unremarkable. IMPRESSION: No acute abnormality noted. Electronically Signed   By: Inez Catalina M.D.   On: 12/13/2019 01:36   DG Lumbar Spine Complete  Result Date: 12/13/2019 CLINICAL DATA:  Recent fall while roller-skating with low back pain, initial encounter EXAM: LUMBAR SPINE - COMPLETE 4+ VIEW COMPARISON:  None. FINDINGS: Five lumbar type vertebral bodies are well visualized. Vertebral body height is well maintained. No pars defects are noted. No anterolisthesis is seen. No acute bony abnormality is noted. No soft tissue abnormality is seen. IMPRESSION: No acute abnormality noted. Electronically Signed   By: Inez Catalina M.D.   On: 12/13/2019 01:35   DG Pelvis 1-2 Views  Result Date: 12/13/2019 CLINICAL DATA:  Fall while roller-skating with pelvic pain, initial encounter EXAM: PELVIS - 1 VIEW COMPARISON:  None. FINDINGS: Pelvic ring is intact. No acute fracture or dislocation is noted. No soft tissue abnormality is noted. IMPRESSION: No acute abnormality noted. Electronically Signed   By: Inez Catalina M.D.   On: 12/13/2019 01:22   DG Tibia/Fibula Left  Result Date: 12/13/2019 CLINICAL DATA:  Recent fall while roller-skating with left  leg pain, initial encounter EXAM: LEFT TIBIA AND FIBULA - 2 VIEW COMPARISON:  None. FINDINGS: There is no evidence of fracture or other focal bone lesions. Soft tissues are unremarkable. IMPRESSION: No acute abnormality noted. Electronically Signed   By: Inez Catalina M.D.   On: 12/13/2019 01:29   DG Ankle Complete Right  Result Date: 12/12/2019 CLINICAL DATA:  Fall, leg pain EXAM: RIGHT ANKLE - COMPLETE 3+ VIEW COMPARISON:  None. FINDINGS: Spiral fractures noted in the mid shaft of the right tibia extending into the distal metadiaphyseal region as well as the distal metadiaphyseal and metaphysis of the fibula. Mildly displaced fracture fragments. No subluxation or dislocation. IMPRESSION: Spiral fractures in the right tibia and fibula as above. Electronically Signed   By: Rolm Baptise M.D.   On:  12/12/2019 22:28   CT HEAD WO CONTRAST  Addendum Date: 12/13/2019   ADDENDUM REPORT: 12/13/2019 01:39 ADDENDUM: The MRI of the brain recommended should be a with and without contrast MRI of the brain. Electronically Signed   By: Inez Catalina M.D.   On: 12/13/2019 01:39   Result Date: 12/13/2019 CLINICAL DATA:  Fall while roller-skating with headaches and neck pain, initial encounter EXAM: CT HEAD WITHOUT CONTRAST CT CERVICAL SPINE WITHOUT CONTRAST TECHNIQUE: Multidetector CT imaging of the head and cervical spine was performed following the standard protocol without intravenous contrast. Multiplanar CT image reconstructions of the cervical spine were also generated. COMPARISON:  None. FINDINGS: CT HEAD FINDINGS Brain: Geographic area of decreased attenuation is noted in the left frontal lobe. A few tiny areas of increased attenuation are noted which may represent focal contusion. No other area of abnormal density is noted. No acute hemorrhage is seen. Vascular: No hyperdense vessel or unexpected calcification. Skull: Normal. Negative for fracture or focal lesion. Sinuses/Orbits: No acute finding. Other: None. CT  CERVICAL SPINE FINDINGS Alignment: Within normal limits. Skull base and vertebrae: 7 cervical segments are well visualized. Vertebral body height is well maintained. No acute fracture or acute facet abnormality is noted. Soft tissues and spinal canal: Surrounding soft tissue structures are within normal limits. Upper chest: Visualized lung apices are within normal limits. Other: None IMPRESSION: CT of the head: Geographic area of decreased attenuation in the left frontal lobe suspicious for acute to subacute ischemia. A few punctate areas of increased attenuation are noted which may represent focal hemorrhage. MRI is recommended for further evaluation. CT of the cervical spine: No acute abnormality noted. Electronically Signed: By: Inez Catalina M.D. On: 12/13/2019 01:21   CT Cervical Spine Wo Contrast  Addendum Date: 12/13/2019   ADDENDUM REPORT: 12/13/2019 01:39 ADDENDUM: The MRI of the brain recommended should be a with and without contrast MRI of the brain. Electronically Signed   By: Inez Catalina M.D.   On: 12/13/2019 01:39   Result Date: 12/13/2019 CLINICAL DATA:  Fall while roller-skating with headaches and neck pain, initial encounter EXAM: CT HEAD WITHOUT CONTRAST CT CERVICAL SPINE WITHOUT CONTRAST TECHNIQUE: Multidetector CT imaging of the head and cervical spine was performed following the standard protocol without intravenous contrast. Multiplanar CT image reconstructions of the cervical spine were also generated. COMPARISON:  None. FINDINGS: CT HEAD FINDINGS Brain: Geographic area of decreased attenuation is noted in the left frontal lobe. A few tiny areas of increased attenuation are noted which may represent focal contusion. No other area of abnormal density is noted. No acute hemorrhage is seen. Vascular: No hyperdense vessel or unexpected calcification. Skull: Normal. Negative for fracture or focal lesion. Sinuses/Orbits: No acute finding. Other: None. CT CERVICAL SPINE FINDINGS Alignment: Within  normal limits. Skull base and vertebrae: 7 cervical segments are well visualized. Vertebral body height is well maintained. No acute fracture or acute facet abnormality is noted. Soft tissues and spinal canal: Surrounding soft tissue structures are within normal limits. Upper chest: Visualized lung apices are within normal limits. Other: None IMPRESSION: CT of the head: Geographic area of decreased attenuation in the left frontal lobe suspicious for acute to subacute ischemia. A few punctate areas of increased attenuation are noted which may represent focal hemorrhage. MRI is recommended for further evaluation. CT of the cervical spine: No acute abnormality noted. Electronically Signed: By: Inez Catalina M.D. On: 12/13/2019 01:21   DG Foot Complete Left  Result Date: 12/13/2019 CLINICAL DATA:  Fall EXAM: LEFT FOOT - COMPLETE 3+ VIEW COMPARISON:  None. FINDINGS: There is no evidence of fracture or dislocation. There is no evidence of arthropathy or other focal bone abnormality. Soft tissues are unremarkable. IMPRESSION: No acute abnormality noted. Electronically Signed   By: Inez Catalina M.D.   On: 12/13/2019 01:34   DG Foot Complete Right  Result Date: 12/13/2019 CLINICAL DATA:  Fall while roller-skating with right foot pain, initial encounter EXAM: RIGHT FOOT COMPLETE - 3+ VIEW COMPARISON:  None. FINDINGS: Oblique fracture of the mid to distal tibia is noted incompletely evaluated on this exam. Distal fibular fracture is noted as well. No fracture in the foot is seen. IMPRESSION: Distal tibial and fibular fractures. The foot is within normal limits. Electronically Signed   By: Inez Catalina M.D.   On: 12/13/2019 01:27   DG Femur Min 2 Views Left  Result Date: 12/13/2019 CLINICAL DATA:  Fall while roller-skating with left leg pain, initial encounter EXAM: LEFT FEMUR 2 VIEWS COMPARISON:  None. FINDINGS: There is no evidence of fracture or other focal bone lesions. Soft tissues are unremarkable. IMPRESSION: No  acute abnormality noted. Electronically Signed   By: Inez Catalina M.D.   On: 12/13/2019 01:28   DG Femur Min 2 Views Right  Result Date: 12/13/2019 CLINICAL DATA:  Fall while roller-skating with right leg pain, initial encounter EXAM: RIGHT FEMUR 2 VIEWS COMPARISON:  None. FINDINGS: There is no evidence of fracture or other focal bone lesions. Soft tissues are unremarkable. IMPRESSION: No acute abnormality noted. Electronically Signed   By: Inez Catalina M.D.   On: 12/13/2019 01:22   Family History Reviewed and non-contributory, no pertinent history of problems with bleeding or anesthesia      Review of Systems 14 system ROS conducted and negative except for that noted in HPI   OBJECTIVE  Vitals: Patient Vitals for the past 8 hrs:  BP Temp Temp src Pulse Resp SpO2 Height Weight  12/13/19 0526 97/71 99.4 F (37.4 C) Oral 81 16 98 % '5\' 2"'  (1.575 m) 53.6 kg  12/13/19 0400 95/75 -- -- 80 -- 92 % -- --  12/13/19 0107 103/83 -- -- 98 -- 99 % -- --  12/13/19 0030 99/76 -- -- 96 -- 98 % -- --  12/13/19 0000 (!) 83/56 -- -- 93 -- 94 % -- --  12/12/19 2346 96/61 -- -- 94 -- 94 % -- --   General: Alert, no acute distress Cardiovascular: Warm extremities noted Respiratory: No cyanosis, no use of accessory musculature GI: No organomegaly, abdomen is soft and non-tender Skin: No lesions in the area of chief complaint other than those listed below in MSK exam.  Neurologic: Sensation intact distally save for the below mentioned MSK exam Psychiatric: Patient is competent for consent with normal mood and affect Lymphatic: No swelling obvious and reported other than the area involved in the exam below  MSK: Bilateral upper extremities: Non-tender to palpation throughout bilateral arms. Actively moves upper extremity without any pain. Distal sensation intact. Warm well perfused hand.  RLE: Reports tenderness throughout right lower extremity. Minimal tenderness right hip and knee. Splint CDI. She is  able to wiggle her toes but reports difficulty to do so due to pain.Remainder of motor difficult to test due to splint. Sensation grossly intact distally. Warm well perfused foot LLE: non-tender to palpation throughout hip, knee, ankle, foot. Actively moves hip, knee, ankle, foot without any pain. Distal sensation intact. Warm well perfused hand.  Test Results Imaging X-ray of right foot show oblique fracture of mid to distal tibia and fibula  Labs cbc Recent Labs    12/12/19 2340  WBC 8.2  HGB 13.5  HCT 38.1  PLT 216    Labs inflam No results for input(s): CRP in the last 72 hours.  Invalid input(s): ESR  Labs coag Recent Labs    12/12/19 2340  INR 1.0    Recent Labs    12/12/19 2340  NA 141  K 3.7  CL 106  CO2 23  GLUCOSE 115*  BUN 8  CREATININE 0.59  CALCIUM 9.0     ASSESSMENT AND PLAN: 39 y.o. female with the following: Right tibial shaft fracture with associated fibula fracture  Orthopedics recommends admission to a medical service and we will provide consultation and follow along. Patient has been admitted to the hospitalist service. She is pending neurology work-up due to evidence of decreased attenuation in the left frontal lobe suspicious of acute to subacute ischemia on her CT scan.   We discussed the nature of the injury as well as the care with the patient. Fracture will unlikely heal with nonoperative measures due to the nature of the fracture. Based on this our recommendation is for operative measures to optimize healing and for stabilization of the fracture.   The risks benefits and alternatives were discussed with the patient including but not limited to the risks of nonoperative treatment, versus surgical intervention including infection, bleeding, nerve injury, periprosthetic fracture, the need for additional surgery, nonunion, malunion, wound healing issues, gait change, blood clots, cardiopulmonary complications, morbidity, mortality, among  others, and they were willing to proceed.    - Plan: IMN of right tibial shaft fracture later today with Dr. Griffin Basil or Dr. Doreatha Martin  - Neurology consultation pending  - Will obtain clearance from neurology prior to surgery   - Keep NPO - Weight Bearing Status/Activity: NWB - Additional recommended labs/tests/orders: None - VTE Prophylaxis: SCDs for now. Likely transition to Lovenox after surgery - Pain control: PRN pain medications, preferring oral medications.  - Follow-up plan: TBD - Contact information: Dr. Ophelia Charter, [facility] PA-C  [place], [place] 12/13/2019

## 2019-12-13 NOTE — Anesthesia Preprocedure Evaluation (Addendum)
Anesthesia Evaluation  Patient identified by MRN, date of birth, ID band Patient awake    Reviewed: Allergy & Precautions, NPO status , Patient's Chart, lab work & pertinent test results  History of Anesthesia Complications Negative for: history of anesthetic complications  Airway Mallampati: II  TM Distance: >3 FB Neck ROM: Full    Dental  (+) Missing,    Pulmonary Current Smoker and Patient abstained from smoking., former smoker,    Pulmonary exam normal        Cardiovascular hypertension, Normal cardiovascular exam     Neuro/Psych negative neurological ROS  negative psych ROS   GI/Hepatic negative GI ROS, Neg liver ROS,   Endo/Other  negative endocrine ROS  Renal/GU negative Renal ROS  negative genitourinary   Musculoskeletal Right tibia fracture   Abdominal   Peds  Hematology  (+) Sickle cell trait ,   Anesthesia Other Findings Day of surgery medications reviewed with patient.  Reproductive/Obstetrics negative OB ROS                           Anesthesia Physical Anesthesia Plan  ASA: II  Anesthesia Plan: General   Post-op Pain Management:    Induction: Intravenous  PONV Risk Score and Plan: 4 or greater and Treatment may vary due to age or medical condition, Ondansetron, Dexamethasone and Midazolam  Airway Management Planned: Oral ETT  Additional Equipment: None  Intra-op Plan:   Post-operative Plan: Extubation in OR  Informed Consent:   Plan Discussed with:   Anesthesia Plan Comments:         Anesthesia Quick Evaluation

## 2019-12-13 NOTE — Progress Notes (Signed)
Orthopedic Tech Progress Note Patient Details:  Maureen Key July 23, 1980 [id number] PACU RN called requesting a CAM WALKER BOOT. I didn't apply it per MD request. Patient is to bring it with her on the follow up appointment  Ortho Devices Type of Ortho Device: CAM walker Ortho Device/Splint Location: RLE Ortho Device/Splint Interventions: Ordered, Other (comment)   Post Interventions Patient Tolerated: Other (comment) Instructions Provided: Other (comment)   [name] 12/13/2019, 3:03 PM

## 2019-12-13 NOTE — ED Provider Notes (Signed)
[facility] EMERGENCY DEPARTMENT Provider Note   CSN: [id number] Arrival date & time: 12/12/19  2146     History Chief Complaint  Patient presents with  . Leg Injury  Level 5 caveat due to acuity of condition  Maureen Key is a 39 y.o. female.  The history is provided by the patient.  Leg Pain Location:  Leg Leg location:  R lower leg Pain details:    Quality:  Aching   Severity:  Severe   Onset quality:  Sudden   Timing:  Constant   Progression:  Worsening Chronicity:  New Relieved by:  Nothing Worsened by:  Extension and flexion Patient reports she was at a roller rink when she slipped and fell landing on her right leg.  She reports she did hit her head and had loss of conscious.  She reports most of the pain is in her right leg but she is now having pain throughout her body.     Past Medical History:  Diagnosis Date  . Blood transfusion without reported diagnosis    last [place] delivery  . Dysmenorrhea   . History of placenta accreta in prior pregnancy, currently pregnant   . History of postpartum hemorrhage, currently pregnant   . History of sepsis   . Pregnancy induced hypertension   . Sickle cell trait ([place])   . Trichimoniasis   . Vaginal Pap smear, abnormal     Patient Active Problem List   Diagnosis Date Noted  . Displaced comminuted fracture of shaft of right tibia, initial encounter for closed fracture 12/12/2019  . H/O abdominal supracervical subtotal hysterectomy 06/27/2017  . Status post emergency cesarean hysterectomy 05/12/2017  . History of acute respiratory failure 05/08/2017  . History of postpartum hemorrhage 05/08/2017  . Trichimoniasis 01/18/2017    Past Surgical History:  Procedure Laterality Date  . ABDOMINAL HYSTERECTOMY  05/12/2017   Procedure: HYSTERECTOMY ABDOMINAL;  Surgeon: Jonnie Kind, MD;  Location: [place];  Service: Obstetrics;;  . CERVICAL CONE BIOPSY    . CESAREAN SECTION    . CESAREAN SECTION N/A  05/12/2017   Procedure: CESAREAN SECTION WITH Hysterectomy;  Surgeon: Jonnie Kind, MD;  Location: [place];  Service: Obstetrics;  Laterality: N/A;     OB History    Gravida  3   Para  3   Term  2   Preterm  1   AB      Living  3     SAB      TAB      Ectopic      Multiple  1   Live Births  3        Obstetric Comments  Mono/mono twins, scheduled        Family History  Problem Relation Age of Onset  . Hypertension Mother   . HIV/AIDS Father   . Cancer Father        lung  . Diabetes Sister   . Diabetes Maternal Aunt   . Diabetes Maternal Uncle   . Cancer Paternal Aunt   . Cancer Paternal Uncle   . Diabetes Maternal Grandmother   . Alzheimer's disease Paternal Grandmother     Social History   Tobacco Use  . Smoking status: Former Smoker    Packs/day: 0.25    Quit date: 03/08/2017    Years since quitting: 2.7  . Smokeless tobacco: Never Used  . Tobacco comment: 2 a day  Substance Use Topics  . Alcohol use: No  .  Drug use: Yes    Types: Marijuana    Comment: last used 04/09/17    Home Medications Prior to Admission medications   Medication Sig Start Date End Date Taking? Authorizing Provider  megestrol (MEGACE) 40 MG tablet 3 tablets a day for 5 days, 2 tablets a day for 5 days then 1 tablet daily 12/18/18   Florian Buff, MD  megestrol (MEGACE) 40 MG tablet 1 tablet daily 01/27/19   Florian Buff, MD  megestrol (MEGACE) 40 MG/ML suspension SHAKE LIQUID AND TAKE 20 ML(800 MG) BY MOUTH DAILY 12/20/18   Cresenzo-Dishmon, Joaquim Lai, CNM    Allergies    Patient has no known allergies.  Review of Systems   Review of Systems  Unable to perform ROS: Acuity of condition    Physical Exam Updated Vital Signs BP (!) 83/56   Pulse 93   Temp 98.3 F (36.8 C) (Oral)   Resp 18   Ht 1.575 m (5\' 2" )   Wt 47.6 kg   LMP 08/05/2016   SpO2 94%   BMI 19.20 kg/m   Physical Exam CONSTITUTIONAL: Somnolent, then becomes very anxious HEAD:  Normocephalic/atraumatic EYES: EOMI/PERRL ENMT: Mucous membranes moist SPINE/BACK:entire spine nontender, patient maintained in spinal precautions/logroll utilized, no bruising/crepitance/stepoffs noted to spine CV: S1/S2 noted, no murmurs/rubs/gallops noted LUNGS: Lungs are clear to auscultation bilaterally, no apparent distress Chest-no deformity ABDOMEN: soft, nontender NEURO: Pt is somnolent then becomes awake/alert and anxious.  She initially reports she can't move her right arm, then when distracted she moves the arm.  She also reports she cannot move her left leg initially, but then begins moving it freely.  Right leg is immobilized. EXTREMITIES: pulses normal/equal, right leg is immobilized, distal pulses intact.  Diffuse tenderness to right tibia, no lacerations, no open wounds.  No crepitus.  She has diffuse tenderness noted throughout the left lower extremity.  Pelvis is stable SKIN: warm, color normal PSYCH: Anxious ED Results / Procedures / Treatments   Labs (all labs ordered are listed, but only abnormal results are displayed) Labs Reviewed  COMPREHENSIVE METABOLIC PANEL - Abnormal; Notable for the following components:      Result Value   Glucose, Bld 115 (*)    Total Bilirubin 0.2 (*)    All other components within normal limits  ETHANOL - Abnormal; Notable for the following components:   Alcohol, Ethyl (B) 189 (*)    All other components within normal limits  SARS CORONAVIRUS 2 BY RT PCR (HOSPITAL ORDER, [facility])  CBC  PROTIME-INR  I-STAT BETA HCG BLOOD, ED ([facility], [facility], [facility] ONLY)    EKG None  Radiology DG Chest 1 View  Result Date: 12/13/2019 CLINICAL DATA:  Fall while roller-skating with chest pain, initial encounter EXAM: CHEST  1 VIEW COMPARISON:  08/12/04 FINDINGS: The heart size and mediastinal contours are within normal limits. Both lungs are clear. The visualized skeletal structures are unremarkable. IMPRESSION: No acute abnormality  noted. Electronically Signed   By: Inez Catalina M.D.   On: 12/13/2019 01:36   DG Lumbar Spine Complete  Result Date: 12/13/2019 CLINICAL DATA:  Recent fall while roller-skating with low back pain, initial encounter EXAM: LUMBAR SPINE - COMPLETE 4+ VIEW COMPARISON:  None. FINDINGS: Five lumbar type vertebral bodies are well visualized. Vertebral body height is well maintained. No pars defects are noted. No anterolisthesis is seen. No acute bony abnormality is noted. No soft tissue abnormality is seen. IMPRESSION: No acute abnormality noted. Electronically Signed  By: Inez Catalina M.D.   On: 12/13/2019 01:35   DG Pelvis 1-2 Views  Result Date: 12/13/2019 CLINICAL DATA:  Fall while roller-skating with pelvic pain, initial encounter EXAM: PELVIS - 1 VIEW COMPARISON:  None. FINDINGS: Pelvic ring is intact. No acute fracture or dislocation is noted. No soft tissue abnormality is noted. IMPRESSION: No acute abnormality noted. Electronically Signed   By: Inez Catalina M.D.   On: 12/13/2019 01:22   DG Tibia/Fibula Left  Result Date: 12/13/2019 CLINICAL DATA:  Recent fall while roller-skating with left leg pain, initial encounter EXAM: LEFT TIBIA AND FIBULA - 2 VIEW COMPARISON:  None. FINDINGS: There is no evidence of fracture or other focal bone lesions. Soft tissues are unremarkable. IMPRESSION: No acute abnormality noted. Electronically Signed   By: Inez Catalina M.D.   On: 12/13/2019 01:29   DG Ankle Complete Right  Result Date: 12/12/2019 CLINICAL DATA:  Fall, leg pain EXAM: RIGHT ANKLE - COMPLETE 3+ VIEW COMPARISON:  None. FINDINGS: Spiral fractures noted in the mid shaft of the right tibia extending into the distal metadiaphyseal region as well as the distal metadiaphyseal and metaphysis of the fibula. Mildly displaced fracture fragments. No subluxation or dislocation. IMPRESSION: Spiral fractures in the right tibia and fibula as above. Electronically Signed   By: Rolm Baptise M.D.   On: 12/12/2019 22:28    CT HEAD WO CONTRAST  Addendum Date: 12/13/2019   ADDENDUM REPORT: 12/13/2019 01:39 ADDENDUM: The MRI of the brain recommended should be a with and without contrast MRI of the brain. Electronically Signed   By: Inez Catalina M.D.   On: 12/13/2019 01:39   Result Date: 12/13/2019 CLINICAL DATA:  Fall while roller-skating with headaches and neck pain, initial encounter EXAM: CT HEAD WITHOUT CONTRAST CT CERVICAL SPINE WITHOUT CONTRAST TECHNIQUE: Multidetector CT imaging of the head and cervical spine was performed following the standard protocol without intravenous contrast. Multiplanar CT image reconstructions of the cervical spine were also generated. COMPARISON:  None. FINDINGS: CT HEAD FINDINGS Brain: Geographic area of decreased attenuation is noted in the left frontal lobe. A few tiny areas of increased attenuation are noted which may represent focal contusion. No other area of abnormal density is noted. No acute hemorrhage is seen. Vascular: No hyperdense vessel or unexpected calcification. Skull: Normal. Negative for fracture or focal lesion. Sinuses/Orbits: No acute finding. Other: None. CT CERVICAL SPINE FINDINGS Alignment: Within normal limits. Skull base and vertebrae: 7 cervical segments are well visualized. Vertebral body height is well maintained. No acute fracture or acute facet abnormality is noted. Soft tissues and spinal canal: Surrounding soft tissue structures are within normal limits. Upper chest: Visualized lung apices are within normal limits. Other: None IMPRESSION: CT of the head: Geographic area of decreased attenuation in the left frontal lobe suspicious for acute to subacute ischemia. A few punctate areas of increased attenuation are noted which may represent focal hemorrhage. MRI is recommended for further evaluation. CT of the cervical spine: No acute abnormality noted. Electronically Signed: By: Inez Catalina M.D. On: 12/13/2019 01:21   CT Cervical Spine Wo Contrast  Addendum Date:  12/13/2019   ADDENDUM REPORT: 12/13/2019 01:39 ADDENDUM: The MRI of the brain recommended should be a with and without contrast MRI of the brain. Electronically Signed   By: Inez Catalina M.D.   On: 12/13/2019 01:39   Result Date: 12/13/2019 CLINICAL DATA:  Fall while roller-skating with headaches and neck pain, initial encounter EXAM: CT HEAD WITHOUT CONTRAST CT CERVICAL SPINE  WITHOUT CONTRAST TECHNIQUE: Multidetector CT imaging of the head and cervical spine was performed following the standard protocol without intravenous contrast. Multiplanar CT image reconstructions of the cervical spine were also generated. COMPARISON:  None. FINDINGS: CT HEAD FINDINGS Brain: Geographic area of decreased attenuation is noted in the left frontal lobe. A few tiny areas of increased attenuation are noted which may represent focal contusion. No other area of abnormal density is noted. No acute hemorrhage is seen. Vascular: No hyperdense vessel or unexpected calcification. Skull: Normal. Negative for fracture or focal lesion. Sinuses/Orbits: No acute finding. Other: None. CT CERVICAL SPINE FINDINGS Alignment: Within normal limits. Skull base and vertebrae: 7 cervical segments are well visualized. Vertebral body height is well maintained. No acute fracture or acute facet abnormality is noted. Soft tissues and spinal canal: Surrounding soft tissue structures are within normal limits. Upper chest: Visualized lung apices are within normal limits. Other: None IMPRESSION: CT of the head: Geographic area of decreased attenuation in the left frontal lobe suspicious for acute to subacute ischemia. A few punctate areas of increased attenuation are noted which may represent focal hemorrhage. MRI is recommended for further evaluation. CT of the cervical spine: No acute abnormality noted. Electronically Signed: By: Inez Catalina M.D. On: 12/13/2019 01:21   DG Foot Complete Left  Result Date: 12/13/2019 CLINICAL DATA:  Fall EXAM: LEFT FOOT -  COMPLETE 3+ VIEW COMPARISON:  None. FINDINGS: There is no evidence of fracture or dislocation. There is no evidence of arthropathy or other focal bone abnormality. Soft tissues are unremarkable. IMPRESSION: No acute abnormality noted. Electronically Signed   By: Inez Catalina M.D.   On: 12/13/2019 01:34   DG Foot Complete Right  Result Date: 12/13/2019 CLINICAL DATA:  Fall while roller-skating with right foot pain, initial encounter EXAM: RIGHT FOOT COMPLETE - 3+ VIEW COMPARISON:  None. FINDINGS: Oblique fracture of the mid to distal tibia is noted incompletely evaluated on this exam. Distal fibular fracture is noted as well. No fracture in the foot is seen. IMPRESSION: Distal tibial and fibular fractures. The foot is within normal limits. Electronically Signed   By: Inez Catalina M.D.   On: 12/13/2019 01:27   DG Femur Min 2 Views Left  Result Date: 12/13/2019 CLINICAL DATA:  Fall while roller-skating with left leg pain, initial encounter EXAM: LEFT FEMUR 2 VIEWS COMPARISON:  None. FINDINGS: There is no evidence of fracture or other focal bone lesions. Soft tissues are unremarkable. IMPRESSION: No acute abnormality noted. Electronically Signed   By: Inez Catalina M.D.   On: 12/13/2019 01:28   DG Femur Min 2 Views Right  Result Date: 12/13/2019 CLINICAL DATA:  Fall while roller-skating with right leg pain, initial encounter EXAM: RIGHT FEMUR 2 VIEWS COMPARISON:  None. FINDINGS: There is no evidence of fracture or other focal bone lesions. Soft tissues are unremarkable. IMPRESSION: No acute abnormality noted. Electronically Signed   By: Inez Catalina M.D.   On: 12/13/2019 01:22    Procedures .Critical Care Performed by: Ripley Fraise, MD Authorized by: Ripley Fraise, MD   Critical care provider statement:    Critical care time (minutes):  45   Critical care start time:  12/13/2019 12:00 AM   Critical care end time:  12/13/2019 12:45 AM   Critical care was necessary to treat or prevent imminent or  life-threatening deterioration of the following conditions:  CNS failure or compromise and trauma   Critical care was time spent personally by me on the following activities:  Ordering  and performing treatments and interventions, ordering and review of laboratory studies, ordering and review of radiographic studies, pulse oximetry, re-evaluation of patient's condition, examination of patient, evaluation of patient's response to treatment, discussions with consultants, development of treatment plan with patient or surrogate and obtaining history from patient or surrogate   I assumed direction of critical care for this patient from another provider in my specialty: no       Medications Ordered in ED Medications  HYDROmorphone (DILAUDID) injection 0.5-1 mg (1 mg Intravenous Given 12/13/19 0141)  fentaNYL (SUBLIMAZE) injection 100 mcg (100 mcg Intravenous Given 12/12/19 2324)  sodium chloride 0.9 % bolus 1,000 mL (1,000 mLs Intravenous New Bag/Given 12/13/19 0113)    ED Course  I have reviewed the triage vital signs and the nursing notes.  Pertinent labs & imaging results that were available during my care of the patient were reviewed by me and considered in my medical decision making (see chart for details).    MDM Rules/Calculators/A&P                      12:25 AM Patient initially presented with right leg pain and injury after fall at roller rink.  Initial report was that that was the only injury.  When I arrived to the room patient was somnolent, she woke up and stated she hit her head and and had loss of consciousness.  And on exam she had difficulty moving her right arm and left leg initially but then freely moved it after the exam.  She also had diffuse tenderness to left leg.  Patient is an unreliable historian, therefore I have ordered CT imaging and x-rays.  I placed patient in a cervical collar and placed in CTL precautions.  No signs of ABD trauma Discussed with Dr. Griffin Basil with  orthopedics.  He will accept in transfer to repair the right leg 2:46 AM No other traumatic injuries.  However patient does have evidence of an infarct on CT head of unclear etiology.  Discussed this with radiology and neurology.  This is not felt to be a traumatic injury.  Patient does not appear to have any other new weakness.  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 This was discussed with Dr. Rory Percy with neurology.  Discussed with Dr. Humphrey Rolls with [facility] who will accept. Discussed again with Dr. Griffin Basil from orthopedics Patient updated on plan  tPA in stroke considered but not given due to: Onset over 3-4.5hours   Final Clinical Impression(s) / ED Diagnoses Final diagnoses:  Closed displaced spiral fracture of shaft of right tibia, initial encounter  Cerebrovascular accident (CVA), unspecified mechanism ([place])    Rx / [place] Orders ED Discharge Orders    None       Ripley Fraise, MD 12/13/19 [phone]

## 2019-12-13 NOTE — Plan of Care (Signed)

## 2019-12-13 NOTE — Anesthesia Procedure Notes (Signed)

## 2019-12-13 NOTE — Anesthesia Procedure Notes (Signed)
Anesthesia Regional Block: Popliteal block   Pre-Anesthetic Checklist: ,, timeout performed, Correct Patient, Correct Site, Correct Laterality, Correct Procedure, Correct Position, site marked, Risks and benefits discussed, pre-op evaluation,  At surgeon's request and post-op pain management  Laterality: Right  Prep: Maximum Sterile Barrier Precautions used, chloraprep       Needles:  Injection technique: Single-shot  Needle Type: Echogenic Stimulator Needle     Needle Length: 9cm  Needle Gauge: 22     Additional Needles:   Procedures:,,,, ultrasound used (permanent image in chart),,,,  Narrative:  Start time: 12/13/2019 12:34 PM End time: 12/13/2019 12:36 PM Injection made incrementally with aspirations every 5 mL.  Performed by: Personally  Anesthesiologist: Brennan Bailey, MD  Additional Notes: Risks, benefits, and alternative discussed. Patient gave consent for procedure. Patient prepped and draped in sterile fashion. Sedation administered, patient remains easily responsive to voice. Relevant anatomy identified with ultrasound guidance. Local anesthetic given in 5cc increments with no signs or symptoms of intravascular injection. No pain or paraesthesias with injection. Patient monitored throughout procedure with signs of LAST or immediate complications. Tolerated well. Ultrasound image placed in chart.  Tawny Asal, MD

## 2019-12-13 NOTE — Transfer of Care (Signed)
Immediate Anesthesia Transfer of Care Note  Patient: Jailynne A Acree  Procedure(s) Performed: INTRAMEDULLARY (IM) NAIL TIBIAL (Right )  Patient Location: PACU  Anesthesia Type:General  Level of Consciousness: awake, alert  and oriented  Airway & Oxygen Therapy: Patient Spontanous Breathing and Patient connected to nasal cannula oxygen  Post-op Assessment: Report given to RN, Post -op Vital signs reviewed and stable and Patient moving all extremities  Post vital signs: Reviewed and stable  Last Vitals:  Vitals Value Taken Time  BP 116/74 12/13/19 1415  Temp    Pulse 94 12/13/19 1417  Resp 20 12/13/19 1417  SpO2 99 % 12/13/19 1417  Vitals shown include unvalidated device data.  Last Pain:  Vitals:   12/13/19 0900  TempSrc:   PainSc: 0-No pain      Patients Stated Pain Goal: 0 (49/67/59 1638)  Complications: No apparent anesthesia complications c/o knee pain

## 2019-12-13 NOTE — H&P (Signed)
History and Physical    Maureen Key GGE:[id number] DOB: 1980-12-08 DOA: 12/12/2019  PCP: Jonnie Kind, MD (Confirm with patient/family/NH records and if not entered, this has to be entered at [facility] point of entry) Patient coming from:   I have personally briefly reviewed patient's old medical records in [place]  Chief Complaint: Intractable pain in right lower leg secondary to fall.  HPI: Maureen Key is a 39 y.o. female with medical history significant of pregnancy-induced hypertension, sickle cell trait and dysmenorrhea is brought to ED by EMS for evaluation of worsening pain in her right lower leg.  Patient reports that she was at a roller rink when suddenly she slipped and fell landing on her right leg.  Patient states that she was unable to stand up and walk because intractable pain in her right leg.  Patient also mentioned that she also hit her head and lost consciousness.  Patient further reported that he was having bad headache and generalized body pain but it has improved after she was given pain medications in the ED and now complaining of pain in right leg with movement.  Patient denies fever, chills, chest pain, shortness of breath, nausea, vomiting, abdominal pain and urinary symptoms.  When EMS arrived there they put a splint on her right lower leg and gave her a dose of fentanyl.    ED Course: On arrival to the ED had blood pressure of 83/56, heart rate 93, temperature 98.3, respiratory rate 18 and oxygen saturation 94% on room air.  Blood work showed WBC 8.2, hemoglobin 13.5, sodium 141, potassium 3.7, BUN 8, creatinine 0.59, blood glucose 115 call level is 189 and Covid 19 test negative.  X-ray right ankle showed spiral fracture right tibia and fibula.  X-ray of right and left femur negative for fractures.  X-ray lumbar spine and pelvis also negative for fracture.  CT scan of head showed an area of decreased attenuation in left frontal lobe suspicious of  acute to subacute ischemia.  ED physician discussed the CT report with radiology and neurology and they did not felt that the CT changes due to traumatic injury.  Neurologist Dr. Malen Gauze recommended MRI brain with and without contrast and neurology consultation.  ED physician also discussed the case with orthopedics/Dr. Griffin Basil who agreed to accept the patient in transfer to repair the right leg patient will be transferred to [facility].  Review of Systems: As per HPI otherwise 10 point review of systems negative.  Unacceptable ROS statements: "10 systems reviewed," "Extensive" (without elaboration).  Acceptable ROS statements: "All others negative," "All others reviewed and are negative," and "All others unremarkable," with at [place] documented Can't double dip - if using for HPI can't use for ROS  Past Medical History:  Diagnosis Date  . Blood transfusion without reported diagnosis    last [place] delivery  . Dysmenorrhea   . History of placenta accreta in prior pregnancy, currently pregnant   . History of postpartum hemorrhage, currently pregnant   . History of sepsis   . Pregnancy induced hypertension   . Sickle cell trait ([place])   . Trichimoniasis   . Vaginal Pap smear, abnormal     Past Surgical History:  Procedure Laterality Date  . ABDOMINAL HYSTERECTOMY  05/12/2017   Procedure: HYSTERECTOMY ABDOMINAL;  Surgeon: Jonnie Kind, MD;  Location: [place];  Service: Obstetrics;;  . CERVICAL CONE BIOPSY    . CESAREAN SECTION    . CESAREAN SECTION N/A  05/12/2017   Procedure: CESAREAN SECTION WITH Hysterectomy;  Surgeon: Jonnie Kind, MD;  Location: [place];  Service: Obstetrics;  Laterality: N/A;     reports that she quit smoking about 2 years ago. She smoked 0.25 packs per day. She has never used smokeless tobacco. She reports current drug use. Drug: Marijuana. She reports that she does not drink alcohol.  No Known Allergies  Family History  Problem  Relation Age of Onset  . Hypertension Mother   . HIV/AIDS Father   . Cancer Father        lung  . Diabetes Sister   . Diabetes Maternal Aunt   . Diabetes Maternal Uncle   . Cancer Paternal Aunt   . Cancer Paternal Uncle   . Diabetes Maternal Grandmother   . Alzheimer's disease Paternal Grandmother     Unacceptable: Noncontributory, unremarkable, or negative. Acceptable: (example)Family history negative for heart disease  Prior to Admission medications   Medication Sig Start Date End Date Taking? Authorizing Provider  megestrol (MEGACE) 40 MG tablet 3 tablets a day for 5 days, 2 tablets a day for 5 days then 1 tablet daily 12/18/18   Florian Buff, MD  megestrol (MEGACE) 40 MG tablet 1 tablet daily 01/27/19   Florian Buff, MD  megestrol (MEGACE) 40 MG/ML suspension SHAKE LIQUID AND TAKE 20 ML(800 MG) BY MOUTH DAILY 12/20/18   Christin Fudge, CNM    Physical Exam: Vitals:   12/13/19 0000 12/13/19 0030 12/13/19 0107 12/13/19 0400  BP: (!) 83/56 99/76 103/83 95/75  Pulse: 93 96 98 80  Resp:      Temp:      TempSrc:      SpO2: 94% 98% 99% 92%  Weight:      Height:        Constitutional: NAD, calm, comfortable Vitals:   12/13/19 0000 12/13/19 0030 12/13/19 0107 12/13/19 0400  BP: (!) 83/56 99/76 103/83 95/75  Pulse: 93 96 98 80  Resp:      Temp:      TempSrc:      SpO2: 94% 98% 99% 92%  Weight:      Height:        General: Patient is a 39 year old African-American female who is not in acute distress. Eyes: PERRL, lids and conjunctivae normal ENMT: Mucous membranes are moist. Posterior pharynx clear of any exudate or lesions.Normal dentition.  Neck: normal, supple, no masses, no thyromegaly Respiratory: clear to auscultation bilaterally, no wheezing, no crackles. Normal respiratory effort. No accessory muscle use.  Cardiovascular: Regular rate and rhythm, no murmurs / rubs / gallops. No extremity edema. 2+ pedal pulses. No carotid bruits.  Abdomen: no  tenderness, no masses palpated. No hepatosplenomegaly. Bowel sounds positive.  Musculoskeletal: Patient is having cast on her right leg.  Diffuse tenderness to right tibia on palpation.  Distal pulsations intact.  No lacerations or wound.  Sensations are intact but severe pain and movement of leg in any action. Skin: no rashes, lesions, ulcers. No induration Neurologic: CN 2-12 grossly intact. Sensation intact, DTR normal. Strength 5/5 in all 4.  Psychiatric: Normal judgment and insight. Alert and oriented x 3. Normal mood.   (Anything < 9 systems with 2 bullets each down codes to level 1) (If patient refuses exam can't bill higher level) (Make sure to document decubitus ulcers present on admission -- if possible -- and whether patient has chronic indwelling catheter at time of admission)  Labs on Admission: I have personally reviewed following  labs and imaging studies  CBC: Recent Labs  Lab 12/12/19 2340  WBC 8.2  HGB 13.5  HCT 38.1  MCV 95.5  PLT 294   Basic Metabolic Panel: Recent Labs  Lab 12/12/19 2340  NA 141  K 3.7  CL 106  CO2 23  GLUCOSE 115*  BUN 8  CREATININE 0.59  CALCIUM 9.0   GFR: Estimated Creatinine Clearance: 72.4 mL/min (by C-G formula based on SCr of 0.59 mg/dL). Liver Function Tests: Recent Labs  Lab 12/12/19 2340  AST 20  ALT 22  ALKPHOS 72  BILITOT 0.2*  PROT 7.6  ALBUMIN 4.5   No results for input(s): LIPASE, AMYLASE in the last 168 hours. No results for input(s): AMMONIA in the last 168 hours. Coagulation Profile: Recent Labs  Lab 12/12/19 2340  INR 1.0   Cardiac Enzymes: No results for input(s): CKTOTAL, CKMB, CKMBINDEX, TROPONINI in the last 168 hours. BNP (last 3 results) No results for input(s): PROBNP in the last 8760 hours. HbA1C: No results for input(s): HGBA1C in the last 72 hours. CBG: No results for input(s): GLUCAP in the last 168 hours. Lipid Profile: No results for input(s): CHOL, HDL, LDLCALC, TRIG, CHOLHDL,  LDLDIRECT in the last 72 hours. Thyroid Function Tests: No results for input(s): TSH, T4TOTAL, FREET4, T3FREE, THYROIDAB in the last 72 hours. Anemia Panel: No results for input(s): VITAMINB12, FOLATE, FERRITIN, TIBC, IRON, RETICCTPCT in the last 72 hours. Urine analysis:    Component Value Date/Time   COLORURINE YELLOW 12/02/2018 0921   APPEARANCEUR CLEAR 12/02/2018 0921   LABSPEC 1.015 12/02/2018 0921   PHURINE 6.0 12/02/2018 0921   GLUCOSEU NEGATIVE 12/02/2018 0921   HGBUR SMALL (A) 12/02/2018 0921   BILIRUBINUR NEGATIVE 12/02/2018 0921   KETONESUR 80 (A) 12/02/2018 0921   PROTEINUR NEGATIVE 12/02/2018 0921   UROBILINOGEN 0.2 12/17/2012 1558   NITRITE NEGATIVE 12/02/2018 0921   LEUKOCYTESUR NEGATIVE 12/02/2018 0921    Radiological Exams on Admission: DG Chest 1 View  Result Date: 12/13/2019 CLINICAL DATA:  Fall while roller-skating with chest pain, initial encounter EXAM: CHEST  1 VIEW COMPARISON:  08/12/04 FINDINGS: The heart size and mediastinal contours are within normal limits. Both lungs are clear. The visualized skeletal structures are unremarkable. IMPRESSION: No acute abnormality noted. Electronically Signed   By: Inez Catalina M.D.   On: 12/13/2019 01:36   DG Lumbar Spine Complete  Result Date: 12/13/2019 CLINICAL DATA:  Recent fall while roller-skating with low back pain, initial encounter EXAM: LUMBAR SPINE - COMPLETE 4+ VIEW COMPARISON:  None. FINDINGS: Five lumbar type vertebral bodies are well visualized. Vertebral body height is well maintained. No pars defects are noted. No anterolisthesis is seen. No acute bony abnormality is noted. No soft tissue abnormality is seen. IMPRESSION: No acute abnormality noted. Electronically Signed   By: Inez Catalina M.D.   On: 12/13/2019 01:35   DG Pelvis 1-2 Views  Result Date: 12/13/2019 CLINICAL DATA:  Fall while roller-skating with pelvic pain, initial encounter EXAM: PELVIS - 1 VIEW COMPARISON:  None. FINDINGS: Pelvic ring is intact.  No acute fracture or dislocation is noted. No soft tissue abnormality is noted. IMPRESSION: No acute abnormality noted. Electronically Signed   By: Inez Catalina M.D.   On: 12/13/2019 01:22   DG Tibia/Fibula Left  Result Date: 12/13/2019 CLINICAL DATA:  Recent fall while roller-skating with left leg pain, initial encounter EXAM: LEFT TIBIA AND FIBULA - 2 VIEW COMPARISON:  None. FINDINGS: There is no evidence of fracture or other focal bone  lesions. Soft tissues are unremarkable. IMPRESSION: No acute abnormality noted. Electronically Signed   By: Inez Catalina M.D.   On: 12/13/2019 01:29   DG Ankle Complete Right  Result Date: 12/12/2019 CLINICAL DATA:  Fall, leg pain EXAM: RIGHT ANKLE - COMPLETE 3+ VIEW COMPARISON:  None. FINDINGS: Spiral fractures noted in the mid shaft of the right tibia extending into the distal metadiaphyseal region as well as the distal metadiaphyseal and metaphysis of the fibula. Mildly displaced fracture fragments. No subluxation or dislocation. IMPRESSION: Spiral fractures in the right tibia and fibula as above. Electronically Signed   By: Rolm Baptise M.D.   On: 12/12/2019 22:28   CT HEAD WO CONTRAST  Addendum Date: 12/13/2019   ADDENDUM REPORT: 12/13/2019 01:39 ADDENDUM: The MRI of the brain recommended should be a with and without contrast MRI of the brain. Electronically Signed   By: Inez Catalina M.D.   On: 12/13/2019 01:39   Result Date: 12/13/2019 CLINICAL DATA:  Fall while roller-skating with headaches and neck pain, initial encounter EXAM: CT HEAD WITHOUT CONTRAST CT CERVICAL SPINE WITHOUT CONTRAST TECHNIQUE: Multidetector CT imaging of the head and cervical spine was performed following the standard protocol without intravenous contrast. Multiplanar CT image reconstructions of the cervical spine were also generated. COMPARISON:  None. FINDINGS: CT HEAD FINDINGS Brain: Geographic area of decreased attenuation is noted in the left frontal lobe. A few tiny areas of increased  attenuation are noted which may represent focal contusion. No other area of abnormal density is noted. No acute hemorrhage is seen. Vascular: No hyperdense vessel or unexpected calcification. Skull: Normal. Negative for fracture or focal lesion. Sinuses/Orbits: No acute finding. Other: None. CT CERVICAL SPINE FINDINGS Alignment: Within normal limits. Skull base and vertebrae: 7 cervical segments are well visualized. Vertebral body height is well maintained. No acute fracture or acute facet abnormality is noted. Soft tissues and spinal canal: Surrounding soft tissue structures are within normal limits. Upper chest: Visualized lung apices are within normal limits. Other: None IMPRESSION: CT of the head: Geographic area of decreased attenuation in the left frontal lobe suspicious for acute to subacute ischemia. A few punctate areas of increased attenuation are noted which may represent focal hemorrhage. MRI is recommended for further evaluation. CT of the cervical spine: No acute abnormality noted. Electronically Signed: By: Inez Catalina M.D. On: 12/13/2019 01:21   CT Cervical Spine Wo Contrast  Addendum Date: 12/13/2019   ADDENDUM REPORT: 12/13/2019 01:39 ADDENDUM: The MRI of the brain recommended should be a with and without contrast MRI of the brain. Electronically Signed   By: Inez Catalina M.D.   On: 12/13/2019 01:39   Result Date: 12/13/2019 CLINICAL DATA:  Fall while roller-skating with headaches and neck pain, initial encounter EXAM: CT HEAD WITHOUT CONTRAST CT CERVICAL SPINE WITHOUT CONTRAST TECHNIQUE: Multidetector CT imaging of the head and cervical spine was performed following the standard protocol without intravenous contrast. Multiplanar CT image reconstructions of the cervical spine were also generated. COMPARISON:  None. FINDINGS: CT HEAD FINDINGS Brain: Geographic area of decreased attenuation is noted in the left frontal lobe. A few tiny areas of increased attenuation are noted which may represent  focal contusion. No other area of abnormal density is noted. No acute hemorrhage is seen. Vascular: No hyperdense vessel or unexpected calcification. Skull: Normal. Negative for fracture or focal lesion. Sinuses/Orbits: No acute finding. Other: None. CT CERVICAL SPINE FINDINGS Alignment: Within normal limits. Skull base and vertebrae: 7 cervical segments are well visualized. Vertebral body  height is well maintained. No acute fracture or acute facet abnormality is noted. Soft tissues and spinal canal: Surrounding soft tissue structures are within normal limits. Upper chest: Visualized lung apices are within normal limits. Other: None IMPRESSION: CT of the head: Geographic area of decreased attenuation in the left frontal lobe suspicious for acute to subacute ischemia. A few punctate areas of increased attenuation are noted which may represent focal hemorrhage. MRI is recommended for further evaluation. CT of the cervical spine: No acute abnormality noted. Electronically Signed: By: Inez Catalina M.D. On: 12/13/2019 01:21   DG Foot Complete Left  Result Date: 12/13/2019 CLINICAL DATA:  Fall EXAM: LEFT FOOT - COMPLETE 3+ VIEW COMPARISON:  None. FINDINGS: There is no evidence of fracture or dislocation. There is no evidence of arthropathy or other focal bone abnormality. Soft tissues are unremarkable. IMPRESSION: No acute abnormality noted. Electronically Signed   By: Inez Catalina M.D.   On: 12/13/2019 01:34   DG Foot Complete Right  Result Date: 12/13/2019 CLINICAL DATA:  Fall while roller-skating with right foot pain, initial encounter EXAM: RIGHT FOOT COMPLETE - 3+ VIEW COMPARISON:  None. FINDINGS: Oblique fracture of the mid to distal tibia is noted incompletely evaluated on this exam. Distal fibular fracture is noted as well. No fracture in the foot is seen. IMPRESSION: Distal tibial and fibular fractures. The foot is within normal limits. Electronically Signed   By: Inez Catalina M.D.   On: 12/13/2019 01:27    DG Femur Min 2 Views Left  Result Date: 12/13/2019 CLINICAL DATA:  Fall while roller-skating with left leg pain, initial encounter EXAM: LEFT FEMUR 2 VIEWS COMPARISON:  None. FINDINGS: There is no evidence of fracture or other focal bone lesions. Soft tissues are unremarkable. IMPRESSION: No acute abnormality noted. Electronically Signed   By: Inez Catalina M.D.   On: 12/13/2019 01:28   DG Femur Min 2 Views Right  Result Date: 12/13/2019 CLINICAL DATA:  Fall while roller-skating with right leg pain, initial encounter EXAM: RIGHT FEMUR 2 VIEWS COMPARISON:  None. FINDINGS: There is no evidence of fracture or other focal bone lesions. Soft tissues are unremarkable. IMPRESSION: No acute abnormality noted. Electronically Signed   By: Inez Catalina M.D.   On: 12/13/2019 01:22     Assessment/Plan Principal Problem:   Closed displaced spiral fracture of shaft of right tibia Orthopedic consulted by ED physician and patient transferred to [facility] for surgery. Patient is hemodynamically stable and started on gentle hydration with IV normal saline. Patient has a cast on right lower extremity for resting and stabilization. Ibuprofen as needed for mild pain while IV Dilaudid as needed for moderate and severe pain ordered.  Active Problems: Suspected acute to subacute ischemia of brain CT head showed area of decreased attenuation in the left frontal lobe suspicious of acute to subacute ischemia.  ED physician contacted on-call neurologist who recommended MRI of brain and neurology consult.  Patient denies any strokelike symptoms.  Cranial 2-12 nerves are intact.  Patient has normal muscular strength and sensations in all extremities.    Alcohol abuse with intoxication ([place]) Alcohol level was 189 on arrival to the hospital but during my evaluation patient was awake alert and oriented to time person and place and there was no sign of intoxication or withdrawal.  Alcohol cessation counseling done     DVT prophylaxis: No chemical prophylaxis because the patient will have surgery today Code Status: Full Family Communication: No family member present at the bedside  Disposition Plan:  Consults called: Orthopedics and neurology Admission status: Inpatient/MedSurg   Edmonia Lynch MD [facility] Pager [phone]   If 7PM-7AM, please contact night-coverage [email or web address] Password [facility]  12/13/2019, 4:26 AM

## 2019-12-13 NOTE — Progress Notes (Signed)
Pt arrrived to unit via stretcher, transported by [facility] without incident.  Pt received 1 mg IV Dilaudid at [facility] at 4 am.  Pt complaining of pain 10/10.    Pt is NPO.  No IV PRN pain med until 7 am.  Provider notified.  Instructed to give pt oxycodone ordered for moderate pain.  Meds administered with approx 100 ml water.    Pt assessed, skin checked, oriented to room and unit.  Skin check and CHG bath done.  Placed in telemetry.  Admission completed.  Pt advised she may not eat or drink.  Swabs provided for dry mouth.  Pt advised she is on strict bed rest.  Scheduled surgery time communicated to pt, who informed her husband by phone.    Pt asked multiple questions about surgery, states she is "nervous."  Will continue to monitor

## 2019-12-13 NOTE — Op Note (Signed)
Orthopaedic Surgery Operative Note (CSN: [id number] ) Date of Surgery: 12/13/2019  Admit Date: 12/12/2019   Diagnoses: Pre-Op Diagnoses: Right tibia and fibula fracture   Post-Op Diagnosis: Same  Procedures: 1. CPT 27759-Intramedullary nailing of right tibia fracture 2. CPT 27781-Nonoperative treatment of right lateral malleolus fracture  Surgeons : Primary: Maureen Key, Maureen Lot, MD  Assistant: Maureen Pace, PA-C  Location: OR 3   Anesthesia:General  Antibiotics: Ancef 2g preop with 1 gm vancomycin powder placed topically   Tourniquet time:None    Estimated Blood NIDP:82 mL  Complications:None  Specimens:None   Implants: Implant Name Type Inv. Item Serial No. Manufacturer Key No. LRB No. Used Action  NAIL TIBIAL EX 9X330MM - [id number] Nail NAIL TIBIAL EX 9X330MM  [employer] [id number] Right 1 Implanted  SCREW LOCK 4X30 TI - [id number] Screw SCREW LOCK 4X30 TI  [employer]  Right 1 Implanted  SCREW LOCKING 4X38 - [id number] Screw SCREW LOCKING 4X38  [employer]  Right 1 Implanted  SCREW LOCK 4X66 TI - [id number] Screw SCREW LOCK 4X66 TI  [employer]  Right 1 Implanted  SCREW LOCKING 4.0 28MM - [id number] Screw SCREW LOCKING 4.0 28MM  [employer]  Right 1 Implanted     Indications for Surgery: 39 year old female with a right distal tibia and fibula fracture status post fall. I recommended intramedullary nailing of right distal tibia fracture.  Risks and benefits were discussed with the patient.  Risks include but not limited to bleeding, infection, malunion, nonunion, hardware failure, hardware irritation, nerve or blood vessel injury, DVT, even the possibility anesthetic complications.  She agreed to proceed with surgery and consent was obtained.  Operative Findings: 1.  Intramedullary nailing of right tibial shaft fracture using Synthes EX nail 9 x 330 mm 2.  Nonoperative treatment of right lateral malleolus fracture with external rotation stress view that showed no  medial clear space widening or talar instability  Procedure: The patient was identified in the preoperative holding area. Consent was confirmed with the patient and their family and all questions were answered. The operative extremity was marked after confirmation with the patient. she was then brought back to the operating room by our anesthesia colleagues.  She was placed under general anesthetic and carefully transferred over to a radiolucent flat top table.  A block was placed by anesthesia.  The right lower extremity was then prepped and draped in usual sterile fashion.  A timeout was performed to verify the patient, the procedure, and the extremity.  Preoperative antibiotics were dosed.  Fluoroscopic imaging was obtained to show the unstable nature of her injury.  A lateral parapatellar incision was carried down through skin and subcutaneous tissue.  The retinaculum of the medial lateral patella was released to be able to mobilize the patella medially.  An appropriate starting point was made with a threaded guidewire.  It was advanced into the proximal metaphysis.  An entry reamer was used to enter the canal.  A ball-tipped guidewire was then placed down the center of the canal across the fracture while reduction was maintained.  It was seated in the distal metaphysis.  I then measured the length of the nail and chose to use a 330 mm nail.  I then sequentially reamed from 8.5 mm to 10 mm.  I obtained excellent chatter with my reamer.  I then chose to place a 9 mm nail.  This was passed into the center of the canal without difficulty.  It was seated until the proximal portion of the  nail was flush.  Then using perfect circle technique from medial to lateral distal interlocking screws were placed.  The targeting arm was used to place proximal interlocking screws.  An external rotation stress view was then performed of the distal fibula.  There is no medial clear space widening or tilt of the talus.  I  felt that the fibula could be treated nonoperatively.  Final fluoroscopic imaging was obtained.  The incisions were copiously irrigated.  A gram of vancomycin powder was placed into the incision.  The skin was closed with 0 Vicryl, 2-0 Vicryl and 3-0 Monocryl.  The skin was sealed with Dermabond.  A well-padded short leg splint was then placed.  The patient was then awoken from anesthesia and taken to the PACU in stable condition.  Post Op Plan/Instructions: The patient will be nonweightbearing to the right lower extremity.  She will be placed on aspirin 325 mg twice daily for DVT prophylaxis.  We will have her return in 2 weeks for x-rays and wound check.  She will receive Ancef for surgical prophylaxis if she stays inpatient tonight.  I was present and performed the entire surgery.  Maureen Pace, PA-C did assist me throughout the case. An assistant was necessary given the difficulty in approach, maintenance of reduction and ability to instrument the fracture.   Maureen Hamming, MD [facility]

## 2019-12-13 NOTE — Progress Notes (Signed)
Report given to short stay- patient is ready for OR

## 2019-12-13 NOTE — Consult Note (Signed)
NEURO HOSPITALIST CONSULT NOTE   Requesting physician: Dr. Lupita Leash  Reason for Consult: Left frontal lobe mass  History obtained from:  Patient and Chart    HPI:                                                                                                                                          Maureen Key is an 39 y.o. female with a PMHx of pregnancy induced HTN, dysmenorrhea and sickle cell trait who presented to [facility] after a fall at a roller rink, following which she had significant right lower leg pain, which was splinted by EMS and required fentanyl en route. After arrival to the OSH ED, X-rays were obtained which revealed a right tibial shaft fracture with associated fibula fracture. She informed the EDP that she also had hit her head with LOC during the fall, so a CT head was obtained, which revealed a left anterior frontal lobe mass-like hypodensity that was initially thought most likely to represent an ischemic infarction. Subsequent MRI brain with contrast, which was obtained after transfer to [facility] for operative management of her fractures, revealed a nonenhancing left anterior frontal lobe mass.  Of note, her EtOH level was 189. However, she exhibited no signs of intoxication. Alcohol cessation was advised by the hospitalist.    She was brought to the ER for intramedullary nailing of her right tibia fracture. Also underwent nonoperative treatment of right lateral malleolus fracture.  The patient is now back from surgery and is conversant. 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  Past Medical History:  Diagnosis Date  . Blood transfusion  without reported diagnosis    last [place] delivery  . Dysmenorrhea   . History of placenta accreta in prior pregnancy, currently pregnant   . History of postpartum hemorrhage, currently pregnant   . History of sepsis   . Pregnancy induced hypertension   . Sickle cell trait ([place])   . Trichimoniasis   . Vaginal Pap smear, abnormal     Past Surgical History:  Procedure Laterality Date  . ABDOMINAL HYSTERECTOMY  05/12/2017   Procedure: HYSTERECTOMY ABDOMINAL;  Surgeon: Jonnie Kind, MD;  Location: [place];  Service: Obstetrics;;  . CERVICAL CONE BIOPSY    . CESAREAN SECTION    . CESAREAN SECTION N/A 05/12/2017   Procedure: CESAREAN SECTION WITH Hysterectomy;  Surgeon: Jonnie Kind, MD;  Location: [place];  Service: Obstetrics;  Laterality: N/A;  Family History  Problem Relation Age of Onset  . Hypertension Mother   . HIV/AIDS Father   . Cancer Father        lung  . Diabetes Sister   . Diabetes Maternal Aunt   . Diabetes Maternal Uncle   . Cancer Paternal Aunt   . Cancer Paternal Uncle   . Diabetes Maternal Grandmother   . Alzheimer's disease Paternal Grandmother               Social History:  reports that she quit smoking about 2 years ago. She smoked 0.25 packs per day. She has never used smokeless tobacco. She reports current drug use. Drug: Marijuana. She reports that she does not drink alcohol.  No Known Allergies  MEDICATIONS:                                                                                                                     Prior to Admission:  Medications Prior to Admission  Medication Sig Dispense Refill Last Dose  . acetaminophen (TYLENOL) 500 MG tablet Take 500 mg by mouth every 6 (six) hours as needed for headache (pain).   2 weeks ago  . Multiple Vitamin (MULTIVITAMIN WITH MINERALS) TABS tablet Take 1 tablet by mouth daily.   12/09/2019   Scheduled: . acetaminophen  1,000 mg Oral Q8H  . [START ON 12/14/2019] aspirin   325 mg Oral BID  . Chlorhexidine Gluconate Cloth  6 each Topical Daily  . docusate sodium  100 mg Oral BID  . mupirocin ointment  1 application Nasal BID  . mupirocin ointment  1 application Nasal BID   Continuous: . sodium chloride 75 mL/hr at 12/13/19 0541  .  ceFAZolin (ANCEF) IV    . lactated ringers    . methocarbamol (ROBAXIN) IV       ROS:                                                                                                                                       RLE sensory numbness and weakness postoperatively. Denies RUE weakness despite exam findings below. No vision changes, facial weakness, confusion or ataxia. Other symptoms as per HPI, with comprehensive ROS otherwise negative.   Blood pressure 113/84, pulse 80, temperature 98.4 F (36.9 C), temperature source Oral, resp. rate 14, height 5\' 2"  (1.575 m), weight 53.6 kg, last menstrual period 08/05/2016, SpO2 99 %.  General Examination:                                                                                                       Physical Exam  HEENT-  Rake/AT  Lungs- Respirations unlabored Extremities- Cast in place on RLE  Neurological Examination Mental Status: Alert, fully oriented, thought content appropriate. Speech fluent with intact comprehension to complex questions and commands.  Cranial Nerves: II:  Visual fields intact with no extinction to DSS. PERRL.   III,IV, VI: No ptosis. EOMI. No nystagmus.  V,VII: Smile symmetric, facial temp sensation equal bilaterally VIII: hearing intact to conversation IX,X: No hypophonia XI: Symmetric shoulder shrug XII: Midline tongue extension Motor: RUE: 4-/5 grip, biceps and triceps. 4+/5 deltoid. RLE: Deferred in the context of pain and residual nerve block effects postoperatively LUE and LLE 5/5 No pronator drift. Sensory: Decreased temp sensation to RUE. Also with decreased RLE sensation, however, RLE sensory exam is confounded by recent nerve  blocks. No extinction to DSS.  Deep Tendon Reflexes: Mild hyperreflexia on the right. Cerebellar: No ataxia with FNF bilaterally.  Gait: Deferred   Lab Results: Basic Metabolic Panel: Recent Labs  Lab 12/12/19 2340  NA 141  K 3.7  CL 106  CO2 23  GLUCOSE 115*  BUN 8  CREATININE 0.59  CALCIUM 9.0    CBC: Recent Labs  Lab 12/12/19 2340  WBC 8.2  HGB 13.5  HCT 38.1  MCV 95.5  PLT 216    Cardiac Enzymes: No results for input(s): CKTOTAL, CKMB, CKMBINDEX, TROPONINI in the last 168 hours.  Lipid Panel: No results for input(s): CHOL, TRIG, HDL, CHOLHDL, VLDL, LDLCALC in the last 168 hours.  Imaging: DG Chest 1 View  Result Date: 12/13/2019 CLINICAL DATA:  Fall while roller-skating with chest pain, initial encounter EXAM: CHEST  1 VIEW COMPARISON:  08/12/04 FINDINGS: The heart size and mediastinal contours are within normal limits. Both lungs are clear. The visualized skeletal structures are unremarkable. IMPRESSION: No acute abnormality noted. Electronically Signed   By: Inez Catalina M.D.   On: 12/13/2019 01:36   DG Lumbar Spine Complete  Result Date: 12/13/2019 CLINICAL DATA:  Recent fall while roller-skating with low back pain, initial encounter EXAM: LUMBAR SPINE - COMPLETE 4+ VIEW COMPARISON:  None. FINDINGS: Five lumbar type vertebral bodies are well visualized. Vertebral body height is well maintained. No pars defects are noted. No anterolisthesis is seen. No acute bony abnormality is noted. No soft tissue abnormality is seen. IMPRESSION: No acute abnormality noted. Electronically Signed   By: Inez Catalina M.D.   On: 12/13/2019 01:35   DG Pelvis 1-2 Views  Result Date: 12/13/2019 CLINICAL DATA:  Fall while roller-skating with pelvic pain, initial encounter EXAM: PELVIS - 1 VIEW COMPARISON:  None. FINDINGS: Pelvic ring is intact. No acute fracture or dislocation is noted. No soft tissue abnormality is noted. IMPRESSION: No acute abnormality noted. Electronically Signed   By:  Inez Catalina M.D.   On: 12/13/2019 01:22   DG Tibia/Fibula Left  Result Date: 12/13/2019 CLINICAL DATA:  Recent fall while roller-skating  with left leg pain, initial encounter EXAM: LEFT TIBIA AND FIBULA - 2 VIEW COMPARISON:  None. FINDINGS: There is no evidence of fracture or other focal bone lesions. Soft tissues are unremarkable. IMPRESSION: No acute abnormality noted. Electronically Signed   By: Inez Catalina M.D.   On: 12/13/2019 01:29   DG Ankle Complete Right  Result Date: 12/12/2019 CLINICAL DATA:  Fall, leg pain EXAM: RIGHT ANKLE - COMPLETE 3+ VIEW COMPARISON:  None. FINDINGS: Spiral fractures noted in the mid shaft of the right tibia extending into the distal metadiaphyseal region as well as the distal metadiaphyseal and metaphysis of the fibula. Mildly displaced fracture fragments. No subluxation or dislocation. IMPRESSION: Spiral fractures in the right tibia and fibula as above. Electronically Signed   By: Rolm Baptise M.D.   On: 12/12/2019 22:28   CT HEAD WO CONTRAST  Addendum Date: 12/13/2019   ADDENDUM REPORT: 12/13/2019 01:39 ADDENDUM: The MRI of the brain recommended should be a with and without contrast MRI of the brain. Electronically Signed   By: Inez Catalina M.D.   On: 12/13/2019 01:39   Result Date: 12/13/2019 CLINICAL DATA:  Fall while roller-skating with headaches and neck pain, initial encounter EXAM: CT HEAD WITHOUT CONTRAST CT CERVICAL SPINE WITHOUT CONTRAST TECHNIQUE: Multidetector CT imaging of the head and cervical spine was performed following the standard protocol without intravenous contrast. Multiplanar CT image reconstructions of the cervical spine were also generated. COMPARISON:  None. FINDINGS: CT HEAD FINDINGS Brain: Geographic area of decreased attenuation is noted in the left frontal lobe. A few tiny areas of increased attenuation are noted which may represent focal contusion. No other area of abnormal density is noted. No acute hemorrhage is seen. Vascular: No  hyperdense vessel or unexpected calcification. Skull: Normal. Negative for fracture or focal lesion. Sinuses/Orbits: No acute finding. Other: None. CT CERVICAL SPINE FINDINGS Alignment: Within normal limits. Skull base and vertebrae: 7 cervical segments are well visualized. Vertebral body height is well maintained. No acute fracture or acute facet abnormality is noted. Soft tissues and spinal canal: Surrounding soft tissue structures are within normal limits. Upper chest: Visualized lung apices are within normal limits. Other: None IMPRESSION: CT of the head: Geographic area of decreased attenuation in the left frontal lobe suspicious for acute to subacute ischemia. A few punctate areas of increased attenuation are noted which may represent focal hemorrhage. MRI is recommended for further evaluation. CT of the cervical spine: No acute abnormality noted. Electronically Signed: By: Inez Catalina M.D. On: 12/13/2019 01:21   CT Cervical Spine Wo Contrast  Addendum Date: 12/13/2019   ADDENDUM REPORT: 12/13/2019 01:39 ADDENDUM: The MRI of the brain recommended should be a with and without contrast MRI of the brain. Electronically Signed   By: Inez Catalina M.D.   On: 12/13/2019 01:39   Result Date: 12/13/2019 CLINICAL DATA:  Fall while roller-skating with headaches and neck pain, initial encounter EXAM: CT HEAD WITHOUT CONTRAST CT CERVICAL SPINE WITHOUT CONTRAST TECHNIQUE: Multidetector CT imaging of the head and cervical spine was performed following the standard protocol without intravenous contrast. Multiplanar CT image reconstructions of the cervical spine were also generated. COMPARISON:  None. FINDINGS: CT HEAD FINDINGS Brain: Geographic area of decreased attenuation is noted in the left frontal lobe. A few tiny areas of increased attenuation are noted which may represent focal contusion. No other area of abnormal density is noted. No acute hemorrhage is seen. Vascular: No hyperdense vessel or unexpected  calcification. Skull: Normal. Negative for fracture or  focal lesion. Sinuses/Orbits: No acute finding. Other: None. CT CERVICAL SPINE FINDINGS Alignment: Within normal limits. Skull base and vertebrae: 7 cervical segments are well visualized. Vertebral body height is well maintained. No acute fracture or acute facet abnormality is noted. Soft tissues and spinal canal: Surrounding soft tissue structures are within normal limits. Upper chest: Visualized lung apices are within normal limits. Other: None IMPRESSION: CT of the head: Geographic area of decreased attenuation in the left frontal lobe suspicious for acute to subacute ischemia. A few punctate areas of increased attenuation are noted which may represent focal hemorrhage. MRI is recommended for further evaluation. CT of the cervical spine: No acute abnormality noted. Electronically Signed: By: Inez Catalina M.D. On: 12/13/2019 01:21   MR BRAIN W WO CONTRAST  Result Date: 12/13/2019 CLINICAL DATA:  Abnormal CT history fall while roller-skating with headache and neck pain EXAM: MRI HEAD WITHOUT AND WITH CONTRAST TECHNIQUE: Multiplanar, multiecho pulse sequences of the brain and surrounding structures were obtained without and with intravenous contrast. CONTRAST:  105mL GADAVIST GADOBUTROL 1 MMOL/ML IV SOLN COMPARISON:  Correlation made with prior head CT FINDINGS: Brain: There is mildly expansile T2 hyperintensity within the anterior left frontal lobe involving the superior frontal gyrus. A few punctate foci of susceptibility present likely correspond to punctate hyperdensity on CT and are compatible with mineralization or microhemorrhage. There is no associated diffusion restriction. There is no abnormal enhancement identified. No significant mass effect. There is no hydrocephalus or extra-axial fluid collection. Vascular: Major vessel flow voids at the skull base are preserved. Skull and upper cervical spine: Normal marrow signal is preserved. Sinuses/Orbits:  Paranasal sinuses are aerated. Orbits are unremarkable. Other: Sella is unremarkable.  Mastoid air cells are clear. 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 Electronically Signed   By: Macy Mis M.D.   On: 12/13/2019 09:41   DG Foot Complete Left  Result Date: 12/13/2019 CLINICAL DATA:  Fall EXAM: LEFT FOOT - COMPLETE 3+ VIEW COMPARISON:  None. FINDINGS: There is no evidence of fracture or dislocation. There is no evidence of arthropathy or other focal bone abnormality. Soft tissues are unremarkable. IMPRESSION: No acute abnormality noted. Electronically Signed   By: Inez Catalina M.D.   On: 12/13/2019 01:34   DG Foot Complete Right  Result Date: 12/13/2019 CLINICAL DATA:  Fall while roller-skating with right foot pain, initial encounter EXAM: RIGHT FOOT COMPLETE - 3+ VIEW COMPARISON:  None. FINDINGS: Oblique fracture of the mid to distal tibia is noted incompletely evaluated on this exam. Distal fibular fracture is noted as well. No fracture in the foot is seen. IMPRESSION: Distal tibial and fibular fractures. The foot is within normal limits. Electronically Signed   By: Inez Catalina M.D.   On: 12/13/2019 01:27   DG Femur Min 2 Views Left  Result Date: 12/13/2019 CLINICAL DATA:  Fall while roller-skating with left leg pain, initial encounter EXAM: LEFT FEMUR 2 VIEWS COMPARISON:  None. FINDINGS: There is no evidence of fracture or other focal bone lesions. Soft tissues are unremarkable. IMPRESSION: No acute abnormality noted. Electronically Signed   By: Inez Catalina M.D.   On: 12/13/2019 01:28   DG Femur Min 2 Views Right  Result Date: 12/13/2019 CLINICAL DATA:  Fall while roller-skating with right leg pain, initial encounter EXAM: RIGHT FEMUR 2 VIEWS COMPARISON:  None. FINDINGS: There is no evidence of fracture or other focal bone lesions. Soft  tissues are  unremarkable. IMPRESSION: No acute abnormality noted. Electronically Signed   By: Inez Catalina M.D.   On: 12/13/2019 01:22    Assessment: 39 year old female presenting after a fall, fracturing her right tibia and fibula. 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 1. Exam reveals right upper extremity weakness and decreased temp sensation on the right.  2. No seizure activity noted.   Recommendations: 1. No indication for Decadron as mass effect from the tumor is mild/minimal. 2. Heme/Onc consult 3. Neurosurgery consult to assess for possible biopsy 4. Outpatient new patient visit with Dr. Mickeal Skinner (Neuro-Oncology), within the next 2 weeks.     Electronically signed: Dr. Kerney Elbe 12/13/2019, 10:49 AM

## 2019-12-13 NOTE — Discharge Instructions (Signed)
Orthopaedic Trauma Service Discharge Instructions   General Discharge Instructions  WEIGHT BEARING STATUS: Non-weightbearing right leg  RANGE OF MOTION/ACTIVITY: okay for knee motion as tolerated. Do not remove splint  Wound Care:Do not remove splint until follow-up. Okay to shower, keep splint covered and dry when doing so  DVT/PE prophylaxis: Aspirin 325 mg twice daily x 30 days  Diet: as you were eating previously.  Can use over the counter stool softeners and bowel preparations, such as Miralax, to help with bowel movements.  Narcotics can be constipating.  Be sure to drink plenty of fluids  PAIN MEDICATION USE AND EXPECTATIONS  You have likely been given narcotic medications to help control your pain.  After a traumatic event that results in an fracture (broken bone) with or without surgery, it is ok to use narcotic pain medications to help control one's pain.  We understand that everyone responds to pain differently and each individual patient will be evaluated on a regular basis for the continued need for narcotic medications. Ideally, narcotic medication use should last no more than 6-8 weeks (coinciding with fracture healing).   As a patient it is your responsibility as well to monitor narcotic medication use and report the amount and frequency you use these medications when you come to your office visit.   We would also advise that if you are using narcotic medications, you should take a dose prior to therapy to maximize you participation.  IF YOU ARE ON NARCOTIC MEDICATIONS IT IS NOT PERMISSIBLE TO OPERATE A MOTOR VEHICLE (MOTORCYCLE/CAR/TRUCK/MOPED) OR HEAVY MACHINERY DO NOT MIX NARCOTICS WITH OTHER CNS (CENTRAL NERVOUS SYSTEM) DEPRESSANTS SUCH AS ALCOHOL   STOP SMOKING OR USING NICOTINE PRODUCTS!!!!  As discussed nicotine severely impairs your body's ability to heal surgical and traumatic wounds but also impairs bone healing.  Wounds and bone heal by forming microscopic  blood vessels (angiogenesis) and nicotine is a vasoconstrictor (essentially, shrinks blood vessels).  Therefore, if vasoconstriction occurs to these microscopic blood vessels they essentially disappear and are unable to deliver necessary nutrients to the healing tissue.  This is one modifiable factor that you can do to dramatically increase your chances of healing your injury.    (This means no smoking, no nicotine gum, patches, etc)  DO NOT USE NONSTEROIDAL ANTI-INFLAMMATORY DRUGS (NSAID'S)  Using products such as Advil (ibuprofen), Aleve (naproxen), Motrin (ibuprofen) for additional pain control during fracture healing can delay and/or prevent the healing response.  If you would like to take over the counter (OTC) medication, Tylenol (acetaminophen) is ok.  However, some narcotic medications that are given for pain control contain acetaminophen as well. Therefore, you should not exceed more than 4000 mg of tylenol in a day if you do not have liver disease.  Also note that there are may OTC medicines, such as cold medicines and allergy medicines that my contain tylenol as well.  If you have any questions about medications and/or interactions please ask your doctor/PA or your pharmacist.      ICE AND ELEVATE INJURED/OPERATIVE EXTREMITY  Using ice and elevating the injured extremity above your heart can help with swelling and pain control.  Icing in a pulsatile fashion, such as 20 minutes on and 20 minutes off, can be followed.    Do not place ice directly on skin. Make sure there is a barrier between to skin and the ice pack.    Using frozen items such as frozen peas works well as the conform nicely to the are that  needs to be iced.  USE AN ACE WRAP OR TED HOSE FOR SWELLING CONTROL  In addition to icing and elevation, Ace wraps or TED hose are used to help limit and resolve swelling.  It is recommended to use Ace wraps or TED hose until you are informed to stop.    When using Ace Wraps start the  wrapping distally (farthest away from the body) and wrap proximally (closer to the body)   Example: If you had surgery on your leg or thing and you do not have a splint on, start the ace wrap at the toes and work your way up to the thigh        If you had surgery on your upper extremity and do not have a splint on, start the ace wrap at your fingers and work your way up to the upper arm  IF YOU ARE IN A SPLINT OR CAST DO NOT [place]   If your splint gets wet for any reason please contact the office immediately. You may shower in your splint or cast as long as you keep it dry.  This can be done by wrapping in a cast cover or garbage back (or similar)  Do Not stick any thing down your splint or cast such as pencils, money, or hangers to try and scratch yourself with.  If you feel itchy take benadryl as prescribed on the bottle for itching  IF YOU ARE IN A CAM BOOT (BLACK BOOT)  You may remove boot periodically. Perform daily dressing changes as noted below.  Wash the liner of the boot regularly and wear a sock when wearing the boot. It is recommended that you sleep in the boot until told otherwise   CALL THE OFFICE WITH ANY QUESTIONS OR CONCERNS: [phone]   VISIT OUR WEBSITE FOR ADDITIONAL INFORMATION: [email or web address]    Discharge Wound Care Instructions  Do NOT apply any ointments, solutions or lotions to pin sites or surgical wounds.  These prevent needed drainage and even though solutions like hydrogen peroxide kill bacteria, they also damage cells lining the pin sites that help fight infection.  Applying lotions or ointments can keep the wounds moist and can cause them to breakdown and open up as well. This can increase the risk for infection. When in doubt call the office.  Surgical incisions should be dressed daily.  If any drainage is noted, use one layer of adaptic, then gauze, Kerlix, and an ace wrap.  Once the incision is completely dry and without drainage,  it may be left open to air out.  Showering may begin 36-48 hours later.  Cleaning gently with soap and water.  Traumatic wounds should be dressed daily as well.    One layer of adaptic, gauze, Kerlix, then ace wrap.  The adaptic can be discontinued once the draining has ceased    If you have a wet to dry dressing: wet the gauze with saline the squeeze as much saline out so the gauze is moist (not soaking wet), place moistened gauze over wound, then place a dry gauze over the moist one, followed by Kerlix wrap, then ace wrap.

## 2019-12-13 NOTE — ED Notes (Signed)
[facility] arrived  

## 2019-12-13 NOTE — Consult Note (Signed)
Orthopaedic Trauma Service (OTS) Consult   Patient ID: Maureen Key MRN: [id number] DOB/AGE: 1980/07/22 39 y.o.  Reason for Consult:Right tibia fracture Referring Physician: Dr. Ophelia Charter, MD [facility]  HPI: Maureen Key is an 39 y.o. female who is being seen in consultation at the request of Dr. Griffin Basil for evaluation of right distal tibia-fibula fracture.  Patient was at [place] when [place] slipped and fell landed on her right leg.  She had inability bear weight check.  She was brought to emergency room x-ray showed a distal tibia and fibula fracture.  She was recommended to be admitted for surgery here at [facility].  She had a work-up including a CT head which showed a possible acute to subacute infarct and as result she was admitted to the medicine service and neurology was consulted.  MRI was performed this morning which shows a nonenhancing hyperintensity compatible with a possible tumor.  She states that she has been having some headaches in the last month.  Some weakness in her right upper extremity.  Patient was seen and evaluated in the preoperative holding area.  Currently only complains of right lower extremity pain.  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  She ambulates without assist device.  She has children at home.  Denies any other injuries.  Denies any significant orthopedic history.  Past Medical History:  Diagnosis Date  . Blood transfusion without reported diagnosis    last [place] delivery  . Dysmenorrhea   . History of placenta accreta in prior pregnancy, currently pregnant   . History of postpartum hemorrhage, currently pregnant   . History of sepsis   . Pregnancy induced hypertension   . Sickle cell trait ([place])   . Trichimoniasis   . Vaginal Pap smear, abnormal     Past Surgical History:  Procedure Laterality Date  . ABDOMINAL HYSTERECTOMY  05/12/2017   Procedure: HYSTERECTOMY ABDOMINAL;  Surgeon:  Jonnie Kind, MD;  Location: [facility];  Service: Obstetrics;;  . CERVICAL CONE BIOPSY    . CESAREAN SECTION    . CESAREAN SECTION N/A 05/12/2017   Procedure: CESAREAN SECTION WITH Hysterectomy;  Surgeon: Jonnie Kind, MD;  Location: [facility];  Service: Obstetrics;  Laterality: N/A;    Family History  Problem Relation Age of Onset  . Hypertension Mother   . HIV/AIDS Father   . Cancer Father        lung  . Diabetes Sister   . Diabetes Maternal Aunt   . Diabetes Maternal Uncle   . Cancer Paternal Aunt   . Cancer Paternal Uncle   . Diabetes Maternal Grandmother   . Alzheimer's disease Paternal Grandmother     Social History:  reports that she quit smoking about 2 years ago. She smoked 0.25 packs per day. She has never used smokeless tobacco. She reports current drug use. Drug: Marijuana. She reports that she does not drink alcohol.  Allergies: No Known Allergies  Medications:  No current facility-administered medications on file prior to encounter.   Current Outpatient Medications on File Prior to Encounter  Medication Sig Dispense Refill  . megestrol (MEGACE) 40 MG tablet 3 tablets a day for 5 days, 2 tablets a day for 5 days then 1 tablet daily 45 tablet 3  . megestrol (MEGACE) 40 MG tablet 1 tablet daily 30 tablet 3  . megestrol (MEGACE) 40 MG/ML suspension SHAKE LIQUID AND TAKE 20 ML(800 MG) BY  MOUTH DAILY 480 mL 6    ROS: Constitutional: No fever or chills Vision: No changes in vision ENT: No difficulty swallowing CV: No chest pain Pulm: No SOB or wheezing GI: No nausea or vomiting GU: No urgency or inability to hold urine Skin: No poor wound healing Neurologic: No numbness or tingling Psychiatric: No depression or anxiety Heme: No bruising Allergic: No reaction to medications or food   Exam: Blood pressure 113/84, pulse 80, temperature 98.4 F (36.9 C), temperature source Oral, resp. rate 14, height 5\' 2"  (1.575 m), weight 53.6 kg, last  menstrual period 08/05/2016, SpO2 99 %. General: No acute distress Orientation: Awake alert and oriented x3 Mood and Affect: Cooperative and pleasant Gait: Unable to assess due to her fracture Coordination and balance: Within normal limits  Right lower extremity: Splint is in place is clean dry and intact.  Compartments are soft compressible.  She has active dorsiflexion plantarflexion of her toes.  She endorses sensation to the dorsum and plantar aspect of her foot.  She has a warm well-perfused foot with brisk cap refill.  No pain about the knee or hip.  Unable to tolerate range of motion of the leg due to her splint that is in place.  Left lower extremity: Skin without lesions. No tenderness to palpation. Full painless ROM, full strength in each muscle groups without evidence of instability.   Medical Decision Making: Data: Imaging: X-rays of the right tibia show tibia and fibula fracture.  Does not appear to have any intra-articular  Labs:  Results for orders placed or performed during the hospital encounter of 12/12/19 (from the past 24 hour(s))  Comprehensive metabolic panel     Status: Abnormal   Collection Time: 12/12/19 11:40 PM  Result Value Ref Range   Sodium 141 135 - 145 mmol/L   Potassium 3.7 3.5 - 5.1 mmol/L   Chloride 106 98 - 111 mmol/L   CO2 23 22 - 32 mmol/L   Glucose, Bld 115 (H) 70 - 99 mg/dL   BUN 8 6 - 20 mg/dL   Creatinine, Ser 0.59 0.44 - 1.00 mg/dL   Calcium 9.0 8.9 - 10.3 mg/dL   Total Protein 7.6 6.5 - 8.1 g/dL   Albumin 4.5 3.5 - 5.0 g/dL   AST 20 15 - 41 U/L   ALT 22 0 - 44 U/L   Alkaline Phosphatase 72 38 - 126 U/L   Total Bilirubin 0.2 (L) 0.3 - 1.2 mg/dL   GFR calc non Af Amer >60 >60 mL/min   GFR calc Af Amer >60 >60 mL/min   Anion gap 12 5 - 15  CBC     Status: None   Collection Time: 12/12/19 11:40 PM  Result Value Ref Range   WBC 8.2 4.0 - 10.5 K/uL   RBC 3.99 3.87 - 5.11 MIL/uL   Hemoglobin 13.5 12.0 - 15.0 g/dL   HCT 38.1 36.0 - 46.0  %   MCV 95.5 80.0 - 100.0 fL   MCH 33.8 26.0 - 34.0 pg   MCHC 35.4 30.0 - 36.0 g/dL   RDW 12.0 11.5 - 15.5 %   Platelets 216 150 - 400 K/uL   nRBC 0.0 0.0 - 0.2 %  Ethanol     Status: Abnormal   Collection Time: 12/12/19 11:40 PM  Result Value Ref Range   Alcohol, Ethyl (B) 189 (H) <10 mg/dL  Protime-INR     Status: None   Collection Time: 12/12/19 11:40 PM  Result Value Ref Range  Prothrombin Time 13.1 11.4 - 15.2 seconds   INR 1.0 0.8 - 1.2  SARS Coronavirus 2 by RT PCR (hospital order, performed in [facility] hospital lab) Nasopharyngeal Nasopharyngeal Swab     Status: None   Collection Time: 12/12/19 11:45 PM   Specimen: Nasopharyngeal Swab  Result Value Ref Range   SARS Coronavirus 2 NEGATIVE NEGATIVE  I-Stat Beta hCG blood, ED ([facility], [facility], [facility] only)     Status: None   Collection Time: 12/12/19 11:46 PM  Result Value Ref Range   I-stat hCG, quantitative <5.0 <5 mIU/mL   Comment 3          Surgical PCR screen     Status: Abnormal   Collection Time: 12/13/19  7:04 AM   Specimen: Nasal Mucosa; Nasal Swab  Result Value Ref Range   MRSA, PCR NEGATIVE NEGATIVE   Staphylococcus aureus POSITIVE (A) NEGATIVE    Imaging or Labs ordered: None  Medical history and chart was reviewed and case discussed with medical provider.  Assessment/Plan: 39 year old female with a right distal tibia and fibula fracture status post fall  Patient will require intramedullary nailing of right distal tibia fracture.  She also will possibly need open reduction internal fixation of her fibula depending on the stability of her ankle after the placement of the nail.  Risks and benefits were discussed with the patient.  Risks include but not limited to bleeding, infection, malunion, nonunion, hardware failure, hardware irritation, nerve or blood vessel injury, DVT, even the possibility anesthetic complications.  She agreed to proceed with surgery and consent was obtained.  Patient will likely need  outpatient work-up for her MRI findings of her head.  I will defer to the neurologist.  Patient may discharge home later today versus tomorrow depending on her pain and how she is mobilizing.  Shona Needles, MD [facility] [phone] (office) [email or web address]

## 2019-12-14 ENCOUNTER — Other Ambulatory Visit: Payer: Self-pay

## 2019-12-14 LAB — CBC
HCT: 32.6 % — ABNORMAL LOW (ref 36.0–46.0)
Hemoglobin: 11.4 g/dL — ABNORMAL LOW (ref 12.0–15.0)
MCH: 34 pg (ref 26.0–34.0)
MCHC: 35 g/dL (ref 30.0–36.0)
MCV: 97.3 fL (ref 80.0–100.0)
Platelets: 158 10*3/uL (ref 150–400)
RBC: 3.35 MIL/uL — ABNORMAL LOW (ref 3.87–5.11)
RDW: 12 % (ref 11.5–15.5)
WBC: 10.5 10*3/uL (ref 4.0–10.5)
nRBC: 0 % (ref 0.0–0.2)

## 2019-12-14 LAB — BASIC METABOLIC PANEL
Anion gap: 7 (ref 5–15)
BUN: 5 mg/dL — ABNORMAL LOW (ref 6–20)
CO2: 26 mmol/L (ref 22–32)
Calcium: 8.8 mg/dL — ABNORMAL LOW (ref 8.9–10.3)
Chloride: 104 mmol/L (ref 98–111)
Creatinine, Ser: 0.63 mg/dL (ref 0.44–1.00)
GFR calc Af Amer: >60 mL/min (ref >60)
GFR calc non Af Amer: >60 mL/min (ref >60)
Glucose, Bld: 123 mg/dL — ABNORMAL HIGH (ref 70–99)
Potassium: 4.1 mmol/L (ref 3.5–5.1)
Sodium: 137 mmol/L (ref 135–145)

## 2019-12-14 MED ORDER — VITAMIN D (ERGOCALCIFEROL) 1.25 MG (50000 UNIT) PO CAPS: 50000.0000 [IU] | ORAL_CAPSULE | ORAL | 0 refills | Status: DC

## 2019-12-14 MED ORDER — ASCORBIC ACID 500 MG PO TABS: 500.0000 mg | ORAL_TABLET | Freq: Every day | ORAL | 0 refills | Status: DC

## 2019-12-14 MED ORDER — OXYCODONE-ACETAMINOPHEN 5-325 MG PO TABS: 1.0000 | ORAL_TABLET | Freq: Four times a day (QID) | ORAL | 0 refills | Status: DC | PRN

## 2019-12-14 MED ORDER — DOCUSATE SODIUM 100 MG PO CAPS: 100.0000 mg | ORAL_CAPSULE | Freq: Two times a day (BID) | ORAL | 0 refills | Status: DC

## 2019-12-14 MED ORDER — ASCORBIC ACID 500 MG PO TABS
500.0000 mg | ORAL_TABLET | Freq: Every day | ORAL | Status: DC
  Administered 2019-12-14: 500 mg via ORAL
  Filled 2019-12-14: qty 1

## 2019-12-14 MED ORDER — VITAMIN D 25 MCG (1000 UNIT) PO TABS
2000.0000 [IU] | ORAL_TABLET | Freq: Two times a day (BID) | ORAL | Status: DC
  Administered 2019-12-14: 2000 [IU] via ORAL
  Filled 2019-12-14: qty 2

## 2019-12-14 MED ORDER — ACETAMINOPHEN 500 MG PO TABS: 500.0000 mg | ORAL_TABLET | Freq: Two times a day (BID) | ORAL | 0 refills | Status: DC

## 2019-12-14 MED ORDER — VITAMIN D3 25 MCG PO TABS: 2000.0000 [IU] | ORAL_TABLET | Freq: Two times a day (BID) | ORAL | 3 refills | Status: DC

## 2019-12-14 MED ORDER — ASPIRIN 325 MG PO TABS: 325.0000 mg | ORAL_TABLET | Freq: Two times a day (BID) | ORAL | 0 refills | Status: AC

## 2019-12-14 MED ORDER — METHOCARBAMOL 500 MG PO TABS: 500.0000 mg | ORAL_TABLET | Freq: Four times a day (QID) | ORAL | 0 refills | Status: DC | PRN

## 2019-12-14 MED ORDER — VITAMIN D (ERGOCALCIFEROL) 1.25 MG (50000 UNIT) PO CAPS
50000.0000 [IU] | ORAL_CAPSULE | ORAL | Status: DC
  Administered 2019-12-14: 50000 [IU] via ORAL
  Filled 2019-12-14: qty 1

## 2019-12-14 NOTE — Care Management (Signed)
RW and 3/1 delivered to room, paient has ride home. She would like OP PT at [facility], referral placed in [place].

## 2019-12-14 NOTE — Discharge Summary (Signed)
Physician Discharge Summary  Maureen Key QJJ:[id number] DOB: 1981/04/30 DOA: 12/12/2019  PCP: Jonnie Kind, MD  Admit date: 12/12/2019 Discharge date: 12/15/2019  Admitted From: Home Disposition:  Home Health  Recommendations for Outpatient Follow-up: 1. Follow up with PCP in 1-2 weeks 2. Please follow-up with Dr. Mickeal Skinner next week- please call the office phone number provided if you do not hear from him soon. 3. Please obtain BMP/CBC in one week 4. Please follow up on the following pending results:  Home Health:Yes  Equipment/Devices: 3:1 commode  Discharge Condition:Stable Code Status:FULL Diet recommendation:  Diet Order            Diet - low sodium heart healthy               Brief/Interim Summary:  Brief Narrative: 39 year old female history of pregnancy-induced hypertension, sickle cell trait, dysmenorrhea brought to the ED after she fell from a roller rink after she slipped, landed on her right leg, unable to stand up and walk due to intractable pain, apparently she passed out remembers waking up in the hospital.  No reported history of seizure. Patient underwent Blood work showed WBC 8.2, hemoglobin 13.5, sodium 141, potassium 3.7, BUN 8, creatinine 0.59, blood glucose 115 call level is 189 and Covid 19 test negative. X-ray right ankle showed spiral fracture right tibia and fibula. X-ray of right and left femur negative for fractures. X-ray lumbar spine and pelvisalso negative for fracture. CT scan of head showed an area of decreased attenuation in left frontal lobe suspicious of acute to subacute ischemia. ED physician discussed the CT report with radiology and neurology and they did not felt that the CT changes due to traumatic injury. Neurologist Dr. Rory Percy recommended MRI brain with and without contrast and neurology consultation. Patient was admitted seen by orthopedics underwent-IM nailing of right tibial fracture and nonoperative treatment of right  lateral malleolus fracture 12/13/19. Patient overall doing well.  She has no more right upper arm extremity weakness, able to move all toes on the right leg.  Splint in place postop dressing intact. She was able to work with PT and is recommended for home health PT. She is on aspirin for DVT prophylaxis Patient will discharge home once okay with orthopedics.  Discharge Diagnoses:   Right tibia and fibula fracture status post fall:IM nailing of right tibial fracture and nonoperative treatment of right lateral malleolus fracture 12/13/19.  Pain control oral and IV opiates, DVT prophylaxis with aspirin as per orthopedics.  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  Seen by neurology advised outpatient new visit with Dr. Mickeal Skinner neuro-oncology and in 2 weeks, neurosurgery consult to assess for possible biopsy, heme-onc consult. Patient tells me that neurology has advised follow-up for outpatient work-up.  I did discuss with Dr. Mickeal Skinner about her and forwarded her chart and information and he is going to have her follow-up soon and arrange for biopsy neurosurgery eval and other work-up as outpatient.  Patient will be provided with Dr. Renda Rolls number for further plan and she understands  Alcohol-use w/ alcohol level 189, advise cessation.  No signs of withdrawal. Fall from roller rink in the setting of alcohol use-patient had extensive work-up with CT MRI brain and multiple imaging.  Vitamin D deficiency at 9.9 with replaced with high-dose vitamin D.  DVT prophylaxis: ASA 325 MG Code Status: FULL  Family Communication: plan of care discussed with patient at bedside. Subjective: Pain is controlled walked up to the rehab room this morning.  No new complaints.  Moving her right upper extremity no more  weakness.  Consults:  Ortho, neurology, neuro oncology  Discharge Exam: Vitals:   12/14/19 0809 12/14/19 1325  BP: 118/65 110/66  Pulse: 70 (!) 59  Resp: 18 16  Temp: 98.4 F (36.9 C) 98.9 F (37.2 C)  SpO2: 100% 100%   General: Pt is alert, awake, not in acute distress Cardiovascular: RRR, S1/S2 +, no rubs, no gallops Respiratory: CTA bilaterally, no wheezing, no rhonchi Abdominal: Soft, NT, ND, bowel sounds + Extremities: no edema, no cyanosis.  Able to move her right lower extremity toes, able to move bilateral upper extremities well with power 5 x 5  Discharge Instructions  Discharge Instructions    Ambulatory referral to Physical Therapy   Complete by: As directed    Diet - low sodium heart healthy   Complete by: As directed    Discharge instructions   Complete by: As directed    Please follow-up with Dr. Mickeal Skinner next week regarding management of Brain Mass.Please call the number provided if you do not hear from his office soon.  Follow-up with orthopedic as instructed  Please call call MD or return to ER for similar or worsening recurring problem that brought you to hospital or if any fever,nausea/vomiting,abdominal pain, uncontrolled pain, chest pain,  shortness of breath or any other alarming symptoms.  Please follow-up your doctor as instructed in a week time and call the office for appointment.  Please avoid alcohol, smoking, or any other illicit substance and maintain healthy habits including taking your regular medications as prescribed.  You were cared for by a hospitalist during your hospital stay. If you have any questions about your discharge medications or the care you received while you were in the hospital after you are discharged, you can call the unit and ask to speak with the hospitalist on call if the hospitalist that took care of you is not available.  Once you are discharged, your primary care physician will handle any further medical issues. Please  note that NO REFILLS for any discharge medications will be authorized once you are discharged, as it is imperative that you return to your primary care physician (or establish a relationship with a primary care physician if you do not have one) for your aftercare needs so that they can reassess your need for medications and monitor your lab values   Discharge wound care:   Complete by: As directed    Continue wound care as per orthopedics   Increase activity slowly   Complete by: As directed      Allergies as of 12/14/2019   No Known Allergies     Medication List    STOP taking these medications   amoxicillin 500 MG capsule Commonly known as: AMOXIL   naproxen 500 MG tablet Commonly known as: NAPROSYN     TAKE these medications   acetaminophen 500 MG tablet Commonly known as: TYLENOL Take 1 tablet (500 mg total) by mouth every 12 (twelve) hours. What changed:   when to take this  reasons to take this   ascorbic acid 500 MG tablet Commonly known as: VITAMIN C Take 1 tablet (500 mg total) by mouth daily.   aspirin 325 MG tablet Take 1 tablet (325 mg total) by mouth 2 (two) times daily.   docusate sodium 100 MG capsule Commonly  known as: COLACE Take 1 capsule (100 mg total) by mouth 2 (two) times daily.   methocarbamol 500 MG tablet Commonly known as: ROBAXIN Take 1-2 tablets (500-1,000 mg total) by mouth every 6 (six) hours as needed for muscle spasms.   multivitamin with minerals Tabs tablet Take 1 tablet by mouth daily.   oxyCODONE-acetaminophen 5-325 MG tablet Commonly known as: Percocet Take 1-2 tablets by mouth every 6 (six) hours as needed for moderate pain or severe pain. Notes to patient: Don't take tylenol/acetaminophen when taking this medication.   Vitamin D (Ergocalciferol) 1.25 MG (50000 UNIT) Caps capsule Commonly known as: DRISDOL Take 1 capsule (50,000 Units total) by mouth every 7 (seven) days. Start taking on: December 21, 2019   Vitamin D3 25 MCG  tablet Commonly known as: Vitamin D Take 2 tablets (2,000 Units total) by mouth 2 (two) times daily.            Discharge Care Instructions  (From admission, onward)         Start     Ordered   12/14/19 0000  Discharge wound care:    Comments: Continue wound care as per orthopedics   12/14/19 1124         Follow-up Information    Haddix, Thomasene Lot, MD. Schedule an appointment as soon as possible for a visit in 2 weeks.   Specialty: Orthopedic Surgery Why: For wound re-check, for repeat x-rays Contact information: [place] [address] [phone]        Ventura Sellers, MD. Call in 3 day(s).   Specialties: Psychiatry, Neurology, Oncology Why: You need to follow-up with him in next 1-2 week.  Please call the office in next 3 days if you do not hear from his office Contact information: [address] [place] [address] [place] Follow up.   Specialty: Rehabilitation Contact information: [place] [id number] [place] [address] [phone]         No Known Allergies  The results of significant diagnostics from this hospitalization (including imaging, microbiology, ancillary and laboratory) are listed below for reference.    Microbiology: Recent Results (from the past 240 hour(s))  SARS Coronavirus 2 by RT PCR (hospital order, performed in [facility] hospital lab) Nasopharyngeal Nasopharyngeal Swab     Status: None   Collection Time: 12/12/19 11:45 PM   Specimen: Nasopharyngeal Swab  Result Value Ref Range Status   SARS Coronavirus 2 NEGATIVE NEGATIVE Final    Comment: (NOTE) SARS-CoV-2 target nucleic acids are NOT DETECTED. The SARS-CoV-2 RNA is generally detectable in upper and lower respiratory specimens during the acute phase of infection. The lowest concentration of SARS-CoV-2 viral copies this assay can detect is 250 copies / mL. A  negative result does not preclude SARS-CoV-2 infection and should not be used as the sole basis for treatment or other patient management decisions.  A negative result may occur with improper specimen collection / handling, submission of specimen other than nasopharyngeal swab, presence of viral mutation(s) within the areas targeted by this assay, and inadequate number of viral copies (<250 copies / mL). A negative result must be combined with clinical observations, patient history, and epidemiological information. Fact Sheet for Patients:   StrictlyIdeas.no Fact Sheet for Healthcare Providers: BankingDealers.co.za This test is not yet approved or cleared  by the [place] FDA and has been authorized for detection and/or diagnosis of  SARS-CoV-2 by FDA under an Emergency Use Authorization (EUA).  This EUA will remain in effect (meaning this test can be used) for the duration of the COVID-19 declaration under Section 564(b)(1) of the Act, 21 U.S.C. section 360bbb-3(b)(1), unless the authorization is terminated or revoked sooner. Performed at [facility], [address], [place], [place] [address]   Surgical PCR screen     Status: Abnormal   Collection Time: 12/13/19  7:04 AM   Specimen: Nasal Mucosa; Nasal Swab  Result Value Ref Range Status   MRSA, PCR NEGATIVE NEGATIVE Final   Staphylococcus aureus POSITIVE (A) NEGATIVE Final    Comment: (NOTE) The Xpert SA Assay (FDA approved for NASAL specimens in patients 55 years of age and older), is one component of a comprehensive surveillance program. It is not intended to diagnose infection nor to guide or monitor treatment. Performed at [facility], [place] [address] [place], [place] [address]     Procedures/Studies: DG Chest 1 View  Result Date: 12/13/2019 CLINICAL DATA:  Fall while roller-skating with chest pain, initial encounter EXAM: CHEST  1 VIEW COMPARISON:  08/12/04 FINDINGS:  The heart size and mediastinal contours are within normal limits. Both lungs are clear. The visualized skeletal structures are unremarkable. IMPRESSION: No acute abnormality noted. Electronically Signed   By: Inez Catalina M.D.   On: 12/13/2019 01:36   DG Lumbar Spine Complete  Result Date: 12/13/2019 CLINICAL DATA:  Recent fall while roller-skating with low back pain, initial encounter EXAM: LUMBAR SPINE - COMPLETE 4+ VIEW COMPARISON:  None. FINDINGS: Five lumbar type vertebral bodies are well visualized. Vertebral body height is well maintained. No pars defects are noted. No anterolisthesis is seen. No acute bony abnormality is noted. No soft tissue abnormality is seen. IMPRESSION: No acute abnormality noted. Electronically Signed   By: Inez Catalina M.D.   On: 12/13/2019 01:35   DG Pelvis 1-2 Views  Result Date: 12/13/2019 CLINICAL DATA:  Fall while roller-skating with pelvic pain, initial encounter EXAM: PELVIS - 1 VIEW COMPARISON:  None. FINDINGS: Pelvic ring is intact. No acute fracture or dislocation is noted. No soft tissue abnormality is noted. IMPRESSION: No acute abnormality noted. Electronically Signed   By: Inez Catalina M.D.   On: 12/13/2019 01:22   DG Tibia/Fibula Left  Result Date: 12/13/2019 CLINICAL DATA:  Recent fall while roller-skating with left leg pain, initial encounter EXAM: LEFT TIBIA AND FIBULA - 2 VIEW COMPARISON:  None. FINDINGS: There is no evidence of fracture or other focal bone lesions. Soft tissues are unremarkable. IMPRESSION: No acute abnormality noted. Electronically Signed   By: Inez Catalina M.D.   On: 12/13/2019 01:29   DG Tibia/Fibula Right  Result Date: 12/13/2019 CLINICAL DATA:  IM nail EXAM: RIGHT TIBIA AND FIBULA - 2 VIEW; DG C-ARM 1-60 MIN COMPARISON:  12/13/2019 FINDINGS: Placement of IM nail across the right tibial fracture. Near anatomic alignment. Improved alignment at the fibular fracture. IMPRESSION: IM nail within the tibia across the tibial fracture with  improved alignment. Improved alignment at the fibular fracture. No complicating feature. Electronically Signed   By: Rolm Baptise M.D.   On: 12/13/2019 15:14   DG Ankle Complete Right  Result Date: 12/12/2019 CLINICAL DATA:  Fall, leg pain EXAM: RIGHT ANKLE - COMPLETE 3+ VIEW COMPARISON:  None. FINDINGS: Spiral fractures noted in the mid shaft of the right tibia extending into the distal metadiaphyseal region as well as the distal metadiaphyseal and metaphysis of the fibula. Mildly displaced fracture fragments.  No subluxation or dislocation. IMPRESSION: Spiral fractures in the right tibia and fibula as above. Electronically Signed   By: Rolm Baptise M.D.   On: 12/12/2019 22:28   CT HEAD WO CONTRAST  Addendum Date: 12/13/2019   ADDENDUM REPORT: 12/13/2019 01:39 ADDENDUM: The MRI of the brain recommended should be a with and without contrast MRI of the brain. Electronically Signed   By: Inez Catalina M.D.   On: 12/13/2019 01:39   Result Date: 12/13/2019 CLINICAL DATA:  Fall while roller-skating with headaches and neck pain, initial encounter EXAM: CT HEAD WITHOUT CONTRAST CT CERVICAL SPINE WITHOUT CONTRAST TECHNIQUE: Multidetector CT imaging of the head and cervical spine was performed following the standard protocol without intravenous contrast. Multiplanar CT image reconstructions of the cervical spine were also generated. COMPARISON:  None. FINDINGS: CT HEAD FINDINGS Brain: Geographic area of decreased attenuation is noted in the left frontal lobe. A few tiny areas of increased attenuation are noted which may represent focal contusion. No other area of abnormal density is noted. No acute hemorrhage is seen. Vascular: No hyperdense vessel or unexpected calcification. Skull: Normal. Negative for fracture or focal lesion. Sinuses/Orbits: No acute finding. Other: None. CT CERVICAL SPINE FINDINGS Alignment: Within normal limits. Skull base and vertebrae: 7 cervical segments are well visualized. Vertebral body  height is well maintained. No acute fracture or acute facet abnormality is noted. Soft tissues and spinal canal: Surrounding soft tissue structures are within normal limits. Upper chest: Visualized lung apices are within normal limits. Other: None IMPRESSION: CT of the head: Geographic area of decreased attenuation in the left frontal lobe suspicious for acute to subacute ischemia. A few punctate areas of increased attenuation are noted which may represent focal hemorrhage. MRI is recommended for further evaluation. CT of the cervical spine: No acute abnormality noted. Electronically Signed: By: Inez Catalina M.D. On: 12/13/2019 01:21   CT Cervical Spine Wo Contrast  Addendum Date: 12/13/2019   ADDENDUM REPORT: 12/13/2019 01:39 ADDENDUM: The MRI of the brain recommended should be a with and without contrast MRI of the brain. Electronically Signed   By: Inez Catalina M.D.   On: 12/13/2019 01:39   Result Date: 12/13/2019 CLINICAL DATA:  Fall while roller-skating with headaches and neck pain, initial encounter EXAM: CT HEAD WITHOUT CONTRAST CT CERVICAL SPINE WITHOUT CONTRAST TECHNIQUE: Multidetector CT imaging of the head and cervical spine was performed following the standard protocol without intravenous contrast. Multiplanar CT image reconstructions of the cervical spine were also generated. COMPARISON:  None. FINDINGS: CT HEAD FINDINGS Brain: Geographic area of decreased attenuation is noted in the left frontal lobe. A few tiny areas of increased attenuation are noted which may represent focal contusion. No other area of abnormal density is noted. No acute hemorrhage is seen. Vascular: No hyperdense vessel or unexpected calcification. Skull: Normal. Negative for fracture or focal lesion. Sinuses/Orbits: No acute finding. Other: None. CT CERVICAL SPINE FINDINGS Alignment: Within normal limits. Skull base and vertebrae: 7 cervical segments are well visualized. Vertebral body height is well maintained. No acute  fracture or acute facet abnormality is noted. Soft tissues and spinal canal: Surrounding soft tissue structures are within normal limits. Upper chest: Visualized lung apices are within normal limits. Other: None IMPRESSION: CT of the head: Geographic area of decreased attenuation in the left frontal lobe suspicious for acute to subacute ischemia. A few punctate areas of increased attenuation are noted which may represent focal hemorrhage. MRI is recommended for further evaluation. CT of the cervical spine:  No acute abnormality noted. Electronically Signed: By: Inez Catalina M.D. On: 12/13/2019 01:21   MR BRAIN W WO CONTRAST  Result Date: 12/13/2019 CLINICAL DATA:  Abnormal CT history fall while roller-skating with headache and neck pain EXAM: MRI HEAD WITHOUT AND WITH CONTRAST TECHNIQUE: Multiplanar, multiecho pulse sequences of the brain and surrounding structures were obtained without and with intravenous contrast. CONTRAST:  59mL GADAVIST GADOBUTROL 1 MMOL/ML IV SOLN COMPARISON:  Correlation made with prior head CT FINDINGS: Brain: There is mildly expansile T2 hyperintensity within the anterior left frontal lobe involving the superior frontal gyrus. A few punctate foci of susceptibility present likely correspond to punctate hyperdensity on CT and are compatible with mineralization or microhemorrhage. There is no associated diffusion restriction. There is no abnormal enhancement identified. No significant mass effect. There is no hydrocephalus or extra-axial fluid collection. Vascular: Major vessel flow voids at the skull base are preserved. Skull and upper cervical spine: Normal marrow signal is preserved. Sinuses/Orbits: Paranasal sinuses are aerated. Orbits are unremarkable. Other: Sella is unremarkable.  Mastoid air cells are clear. 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  Electronically Signed   By: Macy Mis M.D.   On: 12/13/2019 09:41   DG Tibia/Fibula Right Port  Result Date: 12/13/2019 CLINICAL DATA:  Postop internal fixation spiral fracture EXAM: PORTABLE RIGHT TIBIA AND FIBULA - 2 VIEW COMPARISON:  None. FINDINGS: In cast views of the right lower leg demonstrates intramedullary nail across the tibial fracture. Near anatomic alignment. Near anatomic alignment across the distal fibular fracture. IMPRESSION: Internal fixation with near anatomic alignment. No visible complicating feature. Electronically Signed   By: Rolm Baptise M.D.   On: 12/13/2019 15:25   DG Foot Complete Left  Result Date: 12/13/2019 CLINICAL DATA:  Fall EXAM: LEFT FOOT - COMPLETE 3+ VIEW COMPARISON:  None. FINDINGS: There is no evidence of fracture or dislocation. There is no evidence of arthropathy or other focal bone abnormality. Soft tissues are unremarkable. IMPRESSION: No acute abnormality noted. Electronically Signed   By: Inez Catalina M.D.   On: 12/13/2019 01:34   DG Foot Complete Right  Result Date: 12/13/2019 CLINICAL DATA:  Fall while roller-skating with right foot pain, initial encounter EXAM: RIGHT FOOT COMPLETE - 3+ VIEW COMPARISON:  None. FINDINGS: Oblique fracture of the mid to distal tibia is noted incompletely evaluated on this exam. Distal fibular fracture is noted as well. No fracture in the foot is seen. IMPRESSION: Distal tibial and fibular fractures. The foot is within normal limits. Electronically Signed   By: Inez Catalina M.D.   On: 12/13/2019 01:27   DG C-Arm 1-60 Min  Result Date: 12/13/2019 CLINICAL DATA:  IM nail EXAM: RIGHT TIBIA AND FIBULA - 2 VIEW; DG C-ARM 1-60 MIN COMPARISON:  12/13/2019 FINDINGS: Placement of IM nail across the right tibial fracture. Near anatomic alignment. Improved alignment at the fibular fracture. IMPRESSION: IM nail within the tibia across the tibial fracture with improved alignment. Improved alignment at the fibular fracture. No  complicating feature. Electronically Signed   By: Rolm Baptise M.D.   On: 12/13/2019 15:14   DG Femur Min 2 Views Left  Result Date: 12/13/2019 CLINICAL DATA:  Fall while roller-skating with left leg pain, initial encounter EXAM: LEFT FEMUR 2 VIEWS COMPARISON:  None. FINDINGS: There is no evidence of fracture or other focal bone lesions. Soft tissues are unremarkable. IMPRESSION: No acute abnormality noted. Electronically Signed  By: Inez Catalina M.D.   On: 12/13/2019 01:28   DG Femur Min 2 Views Right  Result Date: 12/13/2019 CLINICAL DATA:  Fall while roller-skating with right leg pain, initial encounter EXAM: RIGHT FEMUR 2 VIEWS COMPARISON:  None. FINDINGS: There is no evidence of fracture or other focal bone lesions. Soft tissues are unremarkable. IMPRESSION: No acute abnormality noted. Electronically Signed   By: Inez Catalina M.D.   On: 12/13/2019 01:22    Labs: BNP (last 3 results) No results for input(s): BNP in the last 8760 hours. Basic Metabolic Panel: Recent Labs  Lab 12/12/19 2340 12/14/19 0400  NA 141 137  K 3.7 4.1  CL 106 104  CO2 23 26  GLUCOSE 115* 123*  BUN 8 5*  CREATININE 0.59 0.63  CALCIUM 9.0 8.8*   Liver Function Tests: Recent Labs  Lab 12/12/19 2340  AST 20  ALT 22  ALKPHOS 72  BILITOT 0.2*  PROT 7.6  ALBUMIN 4.5   No results for input(s): LIPASE, AMYLASE in the last 168 hours. No results for input(s): AMMONIA in the last 168 hours. CBC: Recent Labs  Lab 12/12/19 2340 12/14/19 0400  WBC 8.2 10.5  HGB 13.5 11.4*  HCT 38.1 32.6*  MCV 95.5 97.3  PLT 216 158   Cardiac Enzymes: No results for input(s): CKTOTAL, CKMB, CKMBINDEX, TROPONINI in the last 168 hours. BNP: Invalid input(s): POCBNP CBG: No results for input(s): GLUCAP in the last 168 hours. D-Dimer No results for input(s): DDIMER in the last 72 hours. Hgb A1c No results for input(s): HGBA1C in the last 72 hours. Lipid Profile No results for input(s): CHOL, HDL, LDLCALC, TRIG,  CHOLHDL, LDLDIRECT in the last 72 hours. Thyroid function studies No results for input(s): TSH, T4TOTAL, T3FREE, THYROIDAB in the last 72 hours.  Invalid input(s): FREET3 Anemia work up No results for input(s): VITAMINB12, FOLATE, FERRITIN, TIBC, IRON, RETICCTPCT in the last 72 hours. Urinalysis    Component Value Date/Time   COLORURINE YELLOW 12/02/2018 0921   APPEARANCEUR CLEAR 12/02/2018 0921   LABSPEC 1.015 12/02/2018 0921   PHURINE 6.0 12/02/2018 0921   GLUCOSEU NEGATIVE 12/02/2018 0921   HGBUR SMALL (A) 12/02/2018 0921   BILIRUBINUR NEGATIVE 12/02/2018 0921   KETONESUR 80 (A) 12/02/2018 0921   PROTEINUR NEGATIVE 12/02/2018 0921   UROBILINOGEN 0.2 12/17/2012 1558   NITRITE NEGATIVE 12/02/2018 0921   LEUKOCYTESUR NEGATIVE 12/02/2018 0921   Sepsis Labs Invalid input(s): PROCALCITONIN,  WBC,  LACTICIDVEN Microbiology Recent Results (from the past 240 hour(s))  SARS Coronavirus 2 by RT PCR (hospital order, performed in [place] hospital lab) Nasopharyngeal Nasopharyngeal Swab     Status: None   Collection Time: 12/12/19 11:45 PM   Specimen: Nasopharyngeal Swab  Result Value Ref Range Status   SARS Coronavirus 2 NEGATIVE NEGATIVE Final    Comment: (NOTE) SARS-CoV-2 target nucleic acids are NOT DETECTED. The SARS-CoV-2 RNA is generally detectable in upper and lower respiratory specimens during the acute phase of infection. The lowest concentration of SARS-CoV-2 viral copies this assay can detect is 250 copies / mL. A negative result does not preclude SARS-CoV-2 infection and should not be used as the sole basis for treatment or other patient management decisions.  A negative result may occur with improper specimen collection / handling, submission of specimen other than nasopharyngeal swab, presence of viral mutation(s) within the areas targeted by this assay, and inadequate number of viral copies (<250 copies / mL). A negative result must be combined with  clinical observations,  patient history, and epidemiological information. Fact Sheet for Patients:   StrictlyIdeas.no Fact Sheet for Healthcare Providers: BankingDealers.co.za This test is not yet approved or cleared  by the [place] FDA and has been authorized for detection and/or diagnosis of SARS-CoV-2 by FDA under an Emergency Use Authorization (EUA).  This EUA will remain in effect (meaning this test can be used) for the duration of the COVID-19 declaration under Section 564(b)(1) of the Act, 21 U.S.C. section 360bbb-3(b)(1), unless the authorization is terminated or revoked sooner. Performed at [facility], [address], [place], [place] [address]   Surgical PCR screen     Status: Abnormal   Collection Time: 12/13/19  7:04 AM   Specimen: Nasal Mucosa; Nasal Swab  Result Value Ref Range Status   MRSA, PCR NEGATIVE NEGATIVE Final   Staphylococcus aureus POSITIVE (A) NEGATIVE Final    Comment: (NOTE) The Xpert SA Assay (FDA approved for NASAL specimens in patients 83 years of age and older), is one component of a comprehensive surveillance program. It is not intended to diagnose infection nor to guide or monitor treatment. Performed at [facility], [place] [address] [place], [place] [address]      Time coordinating discharge: 25  minutes  SIGNED: Antonieta Pert, MD  [facility] 12/15/2019, 2:50 PM  If 7PM-7AM, please contact night-coverage [email or web address]

## 2019-12-14 NOTE — Progress Notes (Signed)
Orthopaedic Trauma Service Progress Note  Patient ID: RENESME KERRIGAN MRN: [id number] DOB/AGE: [birth year] 37 y.o.  Subjective:  Doing well Ready to go home Pain controlled No other ortho complaints   Labs show vitamin d deficiency   Supplementation started    ROS As above  Objective:   VITALS:   Vitals:   12/13/19 2007 12/14/19 0015 12/14/19 0429 12/14/19 0800  BP: (!) 128/93 106/70 104/88 118/65  Pulse: 85 60 (!) 57 70  Resp: 16 15 17 18   Temp: 100 F (37.8 C) 98.7 F (37.1 C) 98.1 F (36.7 C) 98.4 F (36.9 C)  TempSrc: Oral Oral Oral Oral  SpO2: 100% 100% 96% 100%  Weight:      Height:        Estimated body mass index is 21.61 kg/m as calculated from the following:   Height as of this encounter: 5\' 2"  (1.575 m).   Weight as of this encounter: 53.6 kg.   Intake/Output      06/04 0701 - 06/05 0700 06/05 0701 - 06/06 0700   P.O. 960    I.V. (mL/kg) 1597.6 (29.8)    IV Piggyback 276.2    Total Intake(mL/kg) 2833.8 (52.9)    Urine (mL/kg/hr) 2425 (1.9)    Blood 50    Total Output 2475    Net +358.8         Urine Occurrence 3 x      LABS  Results for orders placed or performed during the hospital encounter of 12/12/19 (from the past 24 hour(s))  VITAMIN D 25 Hydroxy (Vit-D Deficiency, Fractures)     Status: Abnormal   Collection Time: 12/13/19  3:44 PM  Result Value Ref Range   Vit D, 25-Hydroxy 9.99 (L) 30 - 100 ng/mL  CBC     Status: Abnormal   Collection Time: 12/14/19  4:00 AM  Result Value Ref Range   WBC 10.5 4.0 - 10.5 K/uL   RBC 3.35 (L) 3.87 - 5.11 MIL/uL   Hemoglobin 11.4 (L) 12.0 - 15.0 g/dL   HCT 32.6 (L) 36.0 - 46.0 %   MCV 97.3 80.0 - 100.0 fL   MCH 34.0 26.0 - 34.0 pg   MCHC 35.0 30.0 - 36.0 g/dL   RDW 12.0 11.5 - 15.5 %   Platelets 158 150 - 400 K/uL   nRBC 0.0 0.0 - 0.2 %  Basic metabolic panel     Status: Abnormal   Collection Time: 12/14/19   4:00 AM  Result Value Ref Range   Sodium 137 135 - 145 mmol/L   Potassium 4.1 3.5 - 5.1 mmol/L   Chloride 104 98 - 111 mmol/L   CO2 26 22 - 32 mmol/L   Glucose, Bld 123 (H) 70 - 99 mg/dL   BUN 5 (L) 6 - 20 mg/dL   Creatinine, Ser 0.63 0.44 - 1.00 mg/dL   Calcium 8.8 (L) 8.9 - 10.3 mg/dL   GFR calc non Af Amer >60 >60 mL/min   GFR calc Af Amer >60 >60 mL/min   Anion gap 7 5 - 15     PHYSICAL EXAM:   Gen: resting comfortably in bed, sitting up, NAD, appears well, pleasant  Lungs: unlabored Cardiac: regular  Ext:       Right Lower Extremity   Dressing c/d/i  Splint fitting  well  Ext warm   + DP pulse  EHL, FHL, lesser toe motor intact  DPN, SPN, TN sensation intact   Swelling controlled  No pain with passive stretching   Assessment/Plan: 1 Day Post-Op   Principal Problem:   Closed displaced spiral fracture of shaft of right tibia Active Problems:   Alcohol abuse with intoxication ([place])   Closed fracture of right distal fibula   Anti-infectives (From admission, onward)   Start     Dose/Rate Route Frequency Ordered Stop   12/13/19 2000  ceFAZolin (ANCEF) IVPB 2g/100 mL premix     2 g 200 mL/hr over 30 Minutes Intravenous Every 8 hours 12/13/19 1530 12/14/19 1959   12/13/19 1300  vancomycin (VANCOCIN) powder  Status:  Discontinued       As needed 12/13/19 1300 12/13/19 1409    .  POD/HD#: 1  39 y/o female, roller skating injury   -R tibia fracture and R lateral mall fracture s/p IMN R tibia   NWB R leg  Splint x 2 weeks then convert to CAM boot  Unrestricted ROM R knee  Pt to bring CAM boot to post op visit   Ice and elevate  Toe motion as tolerated   - Pain management:  Tylenol  Percocet  Robaxin   - ABL anemia/Hemodynamics  Stable  - Medical issues   Per medicine  - DVT/PE prophylaxis:  ASA BID x 4 weeks  - ID:   periop abx completed  - Metabolic Bone Disease:  + vitamin d deficiency    Supplementation started   - Impediments to fracture  healing:  Vitamin d deficiency   Marijuana use    - Dispo:  Stable for dc home  Follow up with ortho in 10-14 days    Jari Pigg, PA-C [phone] (C) 12/14/2019, 11:13 AM  [facility] [place] [address] [phone] (F)

## 2019-12-14 NOTE — Evaluation (Signed)
Occupational Therapy Evaluation Patient Details Name: Maureen Key MRN: [id number] DOB: 1980-09-18 Today's Date: 12/14/2019    History of Present Illness Pt is a 39 y.o. F with significant PMH of HTN, dysmenorrhea, sepsis, sickle cell strait, who presents after a fall at a roller rink. X ray revealed a right tibial shaft fracture with associated fibular fracture s/p IMN 12/13/2019. 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   Clinical Impression   Pt admitted s/p fall. Pt currently with functional limitations due to the deficits listed below (see OT Problem List). Pt was educated on how to complete LE ADLS with AE and pacing self to be able to precautions when return to home. Pt at this time able to complete sit to stand with no cues to RW, ambulated with RW min guard on even surfaces, LE dressing with min guard on standard surface height.  Pt will benefit from skilled OT to increase their safety and independence with ADL and functional mobility for ADL to facilitate discharge to venue listed below.      Follow Up Recommendations  Supervision - Intermittent;Other (comment)(going to complete OP therapy and has assist to get to)    Equipment Recommendations  3 in 1 bedside commode(was delivered in session)    Recommendations for Other Services       Precautions / Restrictions Precautions Precautions: None Restrictions Weight Bearing Restrictions: Yes RLE Weight Bearing: Non weight bearing      Mobility Bed Mobility Overal bed mobility: Modified Independent                Transfers Overall transfer level: Modified independent Equipment used: Rolling walker (2 wheeled)                  Balance Overall balance assessment: Mild deficits observed, not formally tested                                         ADL  either performed or assessed with clinical judgement   ADL Overall ADL's : Needs assistance/impaired Eating/Feeding: Independent;Sitting   Grooming: Wash/dry hands;Wash/dry face;Sitting   Upper Body Bathing: Set up;Sitting   Lower Body Bathing: Min guard;Sit to/from stand;With adaptive equipment;Cueing for sequencing   Upper Body Dressing : Min guard;Sitting   Lower Body Dressing: Min guard;Sit to/from stand;Cueing for safety;Cueing for sequencing   Toilet Transfer: Min guard;RW   Toileting- Water quality scientist and Hygiene: Min guard;Sit to/from stand   Tub/ Shower Transfer: Min guard;Tub bench;Rolling walker;Shower seat   Functional mobility during ADLs: Min guard;Cueing for safety;Cueing for sequencing;Rolling walker       Vision Patient Visual Report: No change from baseline Vision Assessment?: No apparent visual deficits     Perception Perception Perception Tested?: No   Praxis Praxis Praxis tested?: Not tested    Pertinent Vitals/Pain Pain Assessment: 0-10 Pain Score: 7  Pain Location: R knee Pain Descriptors / Indicators: Grimacing;Guarding Pain Intervention(s): Limited activity within patient's tolerance;Monitored during session;Repositioned     Hand Dominance     Extremity/Trunk Assessment Upper Extremity Assessment Upper Extremity Assessment: Overall WFL for tasks assessed   Lower Extremity Assessment Lower Extremity Assessment: Defer to PT evaluation RLE Deficits / Details: (per pt starting to wiggle toes ) LLE Deficits / Details: [facility]   Cervical / Trunk Assessment Cervical /  Trunk Assessment: Normal   Communication Communication Communication: No difficulties   Cognition Arousal/Alertness: Awake/alert Behavior During Therapy: WFL for tasks assessed/performed Overall Cognitive Status: Within Functional Limits for tasks assessed                                     General Comments       Exercises     Shoulder Instructions       Home Living Family/patient expects to be discharged to:: Private residence Living Arrangements: Spouse/significant other;Children Available Help at Discharge: Family Type of Home: House Home Access: Stairs to enter Technical brewer of Steps: 2 Entrance Stairs-Rails: None Home Layout: Able to live on main level with bedroom/bathroom     Bathroom Shower/Tub: Tub/shower unit         Home Equipment: None(but reported family can get reacher and modified clothing )          Prior Functioning/Environment Level of Independence: Independent                 OT Problem List: Decreased strength;Decreased activity tolerance;Impaired balance (sitting and/or standing);Decreased knowledge of use of DME or AE;Pain      OT Treatment/Interventions:      OT Goals(Current goals can be found in the care plan section) Acute Rehab OT Goals Patient Stated Goal: do as much as I can OT Goal Formulation: With patient Time For Goal Achievement: 01/04/20 Potential to Achieve Goals: Good  OT Frequency:     Barriers to D/C:            Co-evaluation              AM-PAC OT "6 Clicks" Daily Activity     Outcome Measure Help from another person eating meals?: None Help from another person taking care of personal grooming?: None Help from another person toileting, which includes using toliet, bedpan, or urinal?: A Little Help from another person bathing (including washing, rinsing, drying)?: A Little Help from another person to put on and taking off regular upper body clothing?: None Help from another person to put on and taking off regular lower body clothing?: A Little 6 Click Score: 21   End of Session Equipment Utilized During Treatment: Gait belt;Rolling walker  Activity Tolerance: Patient tolerated treatment well Patient left: in bed;with call bell/phone within reach  OT Visit Diagnosis: Unsteadiness on feet (R26.81);Repeated falls (R29.6);Pain                 Time: 5003-7048 OT Time Calculation (min): 17 min Charges:  OT General Charges $OT Visit: 1 Visit OT Evaluation $OT Eval Low Complexity: [place] OTR/L  Acute Rehab Services  [phone] office number [phone] pager number   Maureen Key 12/14/2019, 1:04 PM

## 2019-12-14 NOTE — Evaluation (Signed)
Physical Therapy Evaluation Patient Details Name: Maureen Key MRN: [id number] DOB: 07/26/1980 Today's Date: 12/14/2019   History of Present Illness  Pt is a 39 y.o. F with significant PMH of HTN, dysmenorrhea, sepsis, sickle cell strait, who presents after a fall at a roller rink. X ray revealed a right tibial shaft fracture with associated fibular fracture s/p IMN 12/13/2019. 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  Clinical Impression  Pt very pleasant and eager to participate in therapy evaluation. Noted mild RUE weakness (pt also with some discomfort from IV site), RLE weakness, and R knee pain. Provided gait training with walker vs crutches. Pt fairly steady on both with excellent adherence to weightbearing precautions; pt stating she preferred use of walker ultimately. Practiced stair negotiation with use of walker as pt has no railings. Education provided on precautions, appropriate DME use, knee positioning, open chain exercises. Pt with no further questions/concerns. PT signing off. See below for recommendations.    Follow Up Recommendations No PT follow up;Supervision - Intermittent (will benefit from OPPT once weightbearing status changes)    Equipment Recommendations  Rolling walker with 5" wheels;3in1 (PT)    Recommendations for Other Services       Precautions / Restrictions Precautions Precautions: None Restrictions Weight Bearing Restrictions: Yes RLE Weight Bearing: Non weight bearing      Mobility  Bed Mobility Overal bed mobility: Modified Independent                Transfers Overall transfer level: Modified independent Equipment used: Rolling walker (2 wheeled);Crutches                Ambulation/Gait Ambulation/Gait assistance: Supervision Gait Distance (Feet): 100 Feet(50", 50") Assistive device:  Rolling walker (2 wheeled);Crutches Gait Pattern/deviations: Step-to pattern     General Gait Details: Hop to pattern, good balance and adherence to weightbearing precautions  Stairs Stairs: Yes Stairs assistance: Min guard Stair Management: With walker;Backwards;Forwards Number of Stairs: 6 General stair comments: Ascended steps with walker backwards, descended forwards. Min guard for safety. Cues for technique, sequencing.  Wheelchair Mobility    Modified Rankin (Stroke Patients Only)       Balance Overall balance assessment: Mild deficits observed, not formally tested                                           Pertinent Vitals/Pain Pain Assessment: Faces Faces Pain Scale: Hurts even more Pain Location: R knee Pain Descriptors / Indicators: Grimacing;Guarding Pain Intervention(s): Limited activity within patient's tolerance;Monitored during session    Home Living Family/patient expects to be discharged to:: Private residence Living Arrangements: Spouse/significant other;Children(fiance, children (10, 4, 2 y.o.)) Available Help at Discharge: Family Type of Home: House Home Access: Stairs to enter Entrance Stairs-Rails: None Entrance Stairs-Number of Steps: 2 Home Layout: Able to live on main level with bedroom/bathroom Home Equipment: None      Prior Function Level of Independence: Independent               Hand Dominance        Extremity/Trunk Assessment   Upper Extremity Assessment Upper Extremity Assessment: Defer to OT evaluation    Lower Extremity Assessment Lower Extremity Assessment: RLE deficits/detail;LLE deficits/detail RLE Deficits / Details: Grossly 2/5, unable to wiggle toes yet LLE Deficits /  Details: WFL    Cervical / Trunk Assessment Cervical / Trunk Assessment: Normal  Communication   Communication: No difficulties  Cognition Arousal/Alertness: Awake/alert Behavior During Therapy: WFL for tasks  assessed/performed Overall Cognitive Status: Within Functional Limits for tasks assessed                                        General Comments      Exercises General Exercises - Lower Extremity Quad Sets: Right;10 reps;Supine Heel Slides: Right;5 reps;Supine Hip ABduction/ADduction: Right;10 reps;Supine   Assessment/Plan    PT Assessment Patent does not need any further PT services  PT Problem List Decreased strength;Decreased balance;Decreased mobility;Pain       PT Treatment Interventions      PT Goals (Current goals can be found in the Care Plan section)  Acute Rehab PT Goals Patient Stated Goal: do as much as I can PT Goal Formulation: All assessment and education complete, DC therapy    Frequency     Barriers to discharge        Co-evaluation               AM-PAC PT "6 Clicks" Mobility  Outcome Measure Help needed turning from your back to your side while in a flat bed without using bedrails?: None Help needed moving from lying on your back to sitting on the side of a flat bed without using bedrails?: None Help needed moving to and from a bed to a chair (including a wheelchair)?: None Help needed standing up from a chair using your arms (e.g., wheelchair or bedside chair)?: None Help needed to walk in hospital room?: None Help needed climbing 3-5 steps with a railing? : A Little 6 Click Score: 23    End of Session   Activity Tolerance: Patient tolerated treatment well Patient left: with call bell/phone within reach;in bed Nurse Communication: Mobility status PT Visit Diagnosis: Pain;Difficulty in walking, not elsewhere classified (R26.2) Pain - Right/Left: Right Pain - part of body: Knee    Time: 4158-3094 PT Time Calculation (min) (ACUTE ONLY): 36 min   Charges:   PT Evaluation $PT Eval Low Complexity: 1 Low PT Treatments $Gait Training: 23-37 mins          Maureen Key, PT, DPT Acute Rehabilitation Services Pager  [phone] Office [phone]   Maureen Key 12/14/2019, 11:29 AM

## 2019-12-16 ENCOUNTER — Encounter: Payer: Self-pay | Admitting: *Deleted

## 2019-12-19 ENCOUNTER — Telehealth: Payer: Self-pay | Admitting: Internal Medicine

## 2019-12-19 ENCOUNTER — Ambulatory Visit (HOSPITAL_COMMUNITY): Payer: Medicaid Other | Admitting: Physical Therapy

## 2019-12-19 NOTE — Telephone Encounter (Signed)
A new patient appt has been scheduled for Maureen Key to see Dr. Mickeal Skinner on 6/11 at 1230pm. I was unable to reach the pt because her vm hasn't been setup. I updated neuro team of this.

## 2019-12-19 NOTE — Telephone Encounter (Signed)
I returned Ms. Bedoya's call and confirmed her appt with Dr. Mickeal Skinner for 6/11 at 1230pm.

## 2019-12-20 ENCOUNTER — Other Ambulatory Visit: Payer: Self-pay | Admitting: Radiation Therapy

## 2019-12-20 ENCOUNTER — Inpatient Hospital Stay: Payer: Medicaid Other | Attending: Internal Medicine | Admitting: Internal Medicine

## 2019-12-20 ENCOUNTER — Other Ambulatory Visit: Payer: Self-pay

## 2019-12-20 DIAGNOSIS — Z809 Family history of malignant neoplasm, unspecified: Secondary | ICD-10-CM | POA: Insufficient documentation

## 2019-12-20 DIAGNOSIS — Z87891 Personal history of nicotine dependence: Secondary | ICD-10-CM | POA: Diagnosis not present

## 2019-12-20 DIAGNOSIS — Z90710 Acquired absence of both cervix and uterus: Secondary | ICD-10-CM | POA: Diagnosis not present

## 2019-12-20 DIAGNOSIS — D430 Neoplasm of uncertain behavior of brain, supratentorial: Secondary | ICD-10-CM

## 2019-12-20 DIAGNOSIS — Z801 Family history of malignant neoplasm of trachea, bronchus and lung: Secondary | ICD-10-CM | POA: Diagnosis not present

## 2019-12-20 DIAGNOSIS — G9389 Other specified disorders of brain: Secondary | ICD-10-CM | POA: Insufficient documentation

## 2019-12-20 NOTE — Progress Notes (Signed)
[facility] [facility] [place], [place] [address] [phone]   New Patient Evaluation  Date of Service: 12/20/19 Patient Name: Maureen Key Patient MRN: [id number] Patient DOB: 12-Nov-1980 Provider: Ventura Sellers, MD  Identifying Statement:  Maureen Key is a 39 y.o. female with left frontal brain mass who presents for initial consultation and evaluation.    Referring Provider: Jonnie Kind, MD [address] [place],  [place] [address]  Oncologic History: Oncology History   No history exists.    Biomarkers:  MGMT Unknown.  IDH 1/2 Unknown.  EGFR Unknown  TERT Unknown   History of Present Illness: The patient's records from the referring physician were obtained and reviewed and the patient interviewed to confirm this HPI.  Maureen Key presented to medical attention after trauma, fall while roller skating and fracturing tibia one week ago.  This led to trauma evaluation and CNS imaging which demonstrated left frontal mass ~5cm.  She denies any neurologic symptoms, no headaches or seizures.  Currently in cast and wheelchair following tibial fixation over the weekend, healing well.   Medications: Current Outpatient Medications on File Prior to Visit  Medication Sig Dispense Refill  . acetaminophen (TYLENOL) 500 MG tablet Take 1 tablet (500 mg total) by mouth every 12 (twelve) hours. 60 tablet 0  . ascorbic acid (VITAMIN C) 500 MG tablet Take 1 tablet (500 mg total) by mouth daily. 60 tablet 0  . aspirin 325 MG tablet Take 1 tablet (325 mg total) by mouth 2 (two) times daily. 60 tablet 0  . cholecalciferol (VITAMIN D) 25 MCG tablet Take 2 tablets (2,000 Units total) by mouth 2 (two) times daily. 120 tablet 3  . docusate sodium (COLACE) 100 MG capsule Take 1 capsule (100 mg total) by mouth 2 (two) times daily. 10 capsule 0  . gabapentin (NEURONTIN) 100 MG capsule Take 100 mg by mouth 3 (three) times  daily.    . methocarbamol (ROBAXIN) 500 MG tablet Take 1-2 tablets (500-1,000 mg total) by mouth every 6 (six) hours as needed for muscle spasms. 60 tablet 0  . Multiple Vitamin (MULTIVITAMIN WITH MINERALS) TABS tablet Take 1 tablet by mouth daily.    Marland Kitchen oxyCODONE-acetaminophen (PERCOCET) 5-325 MG tablet Take 1-2 tablets by mouth every 6 (six) hours as needed for moderate pain or severe pain. 50 tablet 0  . [START ON 12/21/2019] Vitamin D, Ergocalciferol, (DRISDOL) 1.25 MG (50000 UNIT) CAPS capsule Take 1 capsule (50,000 Units total) by mouth every 7 (seven) days. 8 capsule 0   No current facility-administered medications on file prior to visit.    Allergies: No Known Allergies Past Medical History:  Past Medical History:  Diagnosis Date  . Blood transfusion without reported diagnosis    last [place] delivery  . Dysmenorrhea   . History of placenta accreta in prior pregnancy, currently pregnant   . History of postpartum hemorrhage, currently pregnant   . History of sepsis   . Pregnancy induced hypertension   . Sickle cell trait ([place])   . Trichimoniasis   . Vaginal Pap smear, abnormal    Past Surgical History:  Past Surgical History:  Procedure Laterality Date  . ABDOMINAL HYSTERECTOMY  05/12/2017   Procedure: HYSTERECTOMY ABDOMINAL;  Surgeon: Jonnie Kind, MD;  Location: [facility];  Service: Obstetrics;;  . CERVICAL CONE BIOPSY    . CESAREAN SECTION    . CESAREAN SECTION N/A 05/12/2017   Procedure: CESAREAN  SECTION WITH Hysterectomy;  Surgeon: Jonnie Kind, MD;  Location: [place];  Service: Obstetrics;  Laterality: N/A;  . TIBIA IM NAIL INSERTION Right 12/13/2019   Procedure: INTRAMEDULLARY (IM) NAIL TIBIAL;  Surgeon: Shona Needles, MD;  Location: [facility];  Service: Orthopedics;  Laterality: Right;   Social History:  Social History   Socioeconomic History  . Marital status: Single    Spouse name: Not on file  . Number of children: 4  . Years of education:  Not on file  . Highest education level: Not on file  Occupational History  . Not on file  Tobacco Use  . Smoking status: Former Smoker    Packs/day: 0.25    Quit date: 03/08/2017    Years since quitting: 2.7  . Smokeless tobacco: Never Used  . Tobacco comment: 2 a day  Vaping Use  . Vaping Use: Never used  Substance and Sexual Activity  . Alcohol use: No  . Drug use: Yes    Types: Marijuana    Comment: last used 04/09/17  . Sexual activity: Yes    Birth control/protection: None, Surgical  Other Topics Concern  . Not on file  Social History Narrative  . Not on file   Social Determinants of Health   Financial Resource Strain:   . Difficulty of Paying Living Expenses:   Food Insecurity:   . Worried About Charity fundraiser in the Last Year:   . Arboriculturist in the Last Year:   Transportation Needs:   . Film/video editor (Medical):   Marland Kitchen Lack of Transportation (Non-Medical):   Physical Activity:   . Days of Exercise per Week:   . Minutes of Exercise per Session:   Stress:   . Feeling of Stress :   Social Connections:   . Frequency of Communication with Friends and Family:   . Frequency of Social Gatherings with Friends and Family:   . Attends Religious Services:   . Active Member of Clubs or Organizations:   . Attends Archivist Meetings:   Marland Kitchen Marital Status:   Intimate Partner Violence:   . Fear of Current or Ex-Partner:   . Emotionally Abused:   Marland Kitchen Physically Abused:   . Sexually Abused:    Family History:  Family History  Problem Relation Age of Onset  . Hypertension Mother   . HIV/AIDS Father   . Cancer Father        lung  . Diabetes Sister   . Diabetes Maternal Aunt   . Diabetes Maternal Uncle   . Cancer Paternal Aunt   . Cancer Paternal Uncle   . Diabetes Maternal Grandmother   . Alzheimer's disease Paternal Grandmother     Review of Systems: Constitutional: Doesn't report fevers, chills or abnormal weight loss Eyes: Doesn't report  blurriness of vision Ears, nose, mouth, throat, and face: Doesn't report sore throat Respiratory: Doesn't report cough, dyspnea or wheezes Cardiovascular: Doesn't report palpitation, chest discomfort  Gastrointestinal:  Doesn't report nausea, constipation, diarrhea GU: Doesn't report incontinence Skin: Doesn't report skin rashes Neurological: Per HPI Musculoskeletal: Doesn't report joint pain Behavioral/Psych: Doesn't report anxiety  Physical Exam: Vitals:   12/20/19 1219  BP: 116/81  Pulse: 97  Resp: 17  Temp: 97.9 F (36.6 C)  SpO2: 100%   KPS: 90. General: Alert, cooperative, pleasant, in no acute distress Head: Normal EENT: No conjunctival injection or scleral icterus.  Lungs: Resp effort normal Cardiac: Regular rate Abdomen: Non-distended abdomen Skin: No  rashes cyanosis or petechiae. Extremities: No clubbing or edema  Neurologic Exam: Mental Status: Awake, alert, attentive to examiner. Oriented to self and environment. Language is fluent with intact comprehension.  Cranial Nerves: Visual acuity is grossly normal. Visual fields are full. Extra-ocular movements intact. No ptosis. Face is symmetric Motor: Tone and bulk are normal. Power is full in both arms and legs. Reflexes are symmetric, no pathologic reflexes present.  Sensory: Intact to light touch Gait: Normal.   Labs: I have reviewed the data as listed    Component Value Date/Time   NA 137 12/14/2019 0400   NA 143 01/03/2018 0949   K 4.1 12/14/2019 0400   CL 104 12/14/2019 0400   CO2 26 12/14/2019 0400   GLUCOSE 123 (H) 12/14/2019 0400   BUN 5 (L) 12/14/2019 0400   BUN 14 01/03/2018 0949   CREATININE 0.63 12/14/2019 0400   CALCIUM 8.8 (L) 12/14/2019 0400   PROT 7.6 12/12/2019 2340   PROT 7.6 01/03/2018 0949   ALBUMIN 4.5 12/12/2019 2340   ALBUMIN 4.8 01/03/2018 0949   AST 20 12/12/2019 2340   ALT 22 12/12/2019 2340   ALKPHOS 72 12/12/2019 2340   BILITOT 0.2 (L) 12/12/2019 2340   BILITOT 0.4  01/03/2018 0949   GFRNONAA >60 12/14/2019 0400   GFRAA >60 12/14/2019 0400   Lab Results  Component Value Date   WBC 10.5 12/14/2019   NEUTROABS 6.0 12/02/2018   HGB 11.4 (L) 12/14/2019   HCT 32.6 (L) 12/14/2019   MCV 97.3 12/14/2019   PLT 158 12/14/2019    Imaging:  DG Chest 1 View  Result Date: 12/13/2019 CLINICAL DATA:  Fall while roller-skating with chest pain, initial encounter EXAM: CHEST  1 VIEW COMPARISON:  08/12/04 FINDINGS: The heart size and mediastinal contours are within normal limits. Both lungs are clear. The visualized skeletal structures are unremarkable. IMPRESSION: No acute abnormality noted. Electronically Signed   By: Inez Catalina M.D.   On: 12/13/2019 01:36   DG Lumbar Spine Complete  Result Date: 12/13/2019 CLINICAL DATA:  Recent fall while roller-skating with low back pain, initial encounter EXAM: LUMBAR SPINE - COMPLETE 4+ VIEW COMPARISON:  None. FINDINGS: Five lumbar type vertebral bodies are well visualized. Vertebral body height is well maintained. No pars defects are noted. No anterolisthesis is seen. No acute bony abnormality is noted. No soft tissue abnormality is seen. IMPRESSION: No acute abnormality noted. Electronically Signed   By: Inez Catalina M.D.   On: 12/13/2019 01:35   DG Pelvis 1-2 Views  Result Date: 12/13/2019 CLINICAL DATA:  Fall while roller-skating with pelvic pain, initial encounter EXAM: PELVIS - 1 VIEW COMPARISON:  None. FINDINGS: Pelvic ring is intact. No acute fracture or dislocation is noted. No soft tissue abnormality is noted. IMPRESSION: No acute abnormality noted. Electronically Signed   By: Inez Catalina M.D.   On: 12/13/2019 01:22   DG Tibia/Fibula Left  Result Date: 12/13/2019 CLINICAL DATA:  Recent fall while roller-skating with left leg pain, initial encounter EXAM: LEFT TIBIA AND FIBULA - 2 VIEW COMPARISON:  None. FINDINGS: There is no evidence of fracture or other focal bone lesions. Soft tissues are unremarkable. IMPRESSION: No  acute abnormality noted. Electronically Signed   By: Inez Catalina M.D.   On: 12/13/2019 01:29   DG Tibia/Fibula Right  Result Date: 12/13/2019 CLINICAL DATA:  IM nail EXAM: RIGHT TIBIA AND FIBULA - 2 VIEW; DG C-ARM 1-60 MIN COMPARISON:  12/13/2019 FINDINGS: Placement of IM nail across the right tibial fracture.  Near anatomic alignment. Improved alignment at the fibular fracture. IMPRESSION: IM nail within the tibia across the tibial fracture with improved alignment. Improved alignment at the fibular fracture. No complicating feature. Electronically Signed   By: Rolm Baptise M.D.   On: 12/13/2019 15:14   DG Ankle Complete Right  Result Date: 12/12/2019 CLINICAL DATA:  Fall, leg pain EXAM: RIGHT ANKLE - COMPLETE 3+ VIEW COMPARISON:  None. FINDINGS: Spiral fractures noted in the mid shaft of the right tibia extending into the distal metadiaphyseal region as well as the distal metadiaphyseal and metaphysis of the fibula. Mildly displaced fracture fragments. No subluxation or dislocation. IMPRESSION: Spiral fractures in the right tibia and fibula as above. Electronically Signed   By: Rolm Baptise M.D.   On: 12/12/2019 22:28   CT HEAD WO CONTRAST  Addendum Date: 12/13/2019   ADDENDUM REPORT: 12/13/2019 01:39 ADDENDUM: The MRI of the brain recommended should be a with and without contrast MRI of the brain. Electronically Signed   By: Inez Catalina M.D.   On: 12/13/2019 01:39   Result Date: 12/13/2019 CLINICAL DATA:  Fall while roller-skating with headaches and neck pain, initial encounter EXAM: CT HEAD WITHOUT CONTRAST CT CERVICAL SPINE WITHOUT CONTRAST TECHNIQUE: Multidetector CT imaging of the head and cervical spine was performed following the standard protocol without intravenous contrast. Multiplanar CT image reconstructions of the cervical spine were also generated. COMPARISON:  None. FINDINGS: CT HEAD FINDINGS Brain: Geographic area of decreased attenuation is noted in the left frontal lobe. A few tiny  areas of increased attenuation are noted which may represent focal contusion. No other area of abnormal density is noted. No acute hemorrhage is seen. Vascular: No hyperdense vessel or unexpected calcification. Skull: Normal. Negative for fracture or focal lesion. Sinuses/Orbits: No acute finding. Other: None. CT CERVICAL SPINE FINDINGS Alignment: Within normal limits. Skull base and vertebrae: 7 cervical segments are well visualized. Vertebral body height is well maintained. No acute fracture or acute facet abnormality is noted. Soft tissues and spinal canal: Surrounding soft tissue structures are within normal limits. Upper chest: Visualized lung apices are within normal limits. Other: None IMPRESSION: CT of the head: Geographic area of decreased attenuation in the left frontal lobe suspicious for acute to subacute ischemia. A few punctate areas of increased attenuation are noted which may represent focal hemorrhage. MRI is recommended for further evaluation. CT of the cervical spine: No acute abnormality noted. Electronically Signed: By: Inez Catalina M.D. On: 12/13/2019 01:21   CT Cervical Spine Wo Contrast  Addendum Date: 12/13/2019   ADDENDUM REPORT: 12/13/2019 01:39 ADDENDUM: The MRI of the brain recommended should be a with and without contrast MRI of the brain. Electronically Signed   By: Inez Catalina M.D.   On: 12/13/2019 01:39   Result Date: 12/13/2019 CLINICAL DATA:  Fall while roller-skating with headaches and neck pain, initial encounter EXAM: CT HEAD WITHOUT CONTRAST CT CERVICAL SPINE WITHOUT CONTRAST TECHNIQUE: Multidetector CT imaging of the head and cervical spine was performed following the standard protocol without intravenous contrast. Multiplanar CT image reconstructions of the cervical spine were also generated. COMPARISON:  None. FINDINGS: CT HEAD FINDINGS Brain: Geographic area of decreased attenuation is noted in the left frontal lobe. A few tiny areas of increased attenuation are noted  which may represent focal contusion. No other area of abnormal density is noted. No acute hemorrhage is seen. Vascular: No hyperdense vessel or unexpected calcification. Skull: Normal. Negative for fracture or focal lesion. Sinuses/Orbits: No acute finding. Other:  None. CT CERVICAL SPINE FINDINGS Alignment: Within normal limits. Skull base and vertebrae: 7 cervical segments are well visualized. Vertebral body height is well maintained. No acute fracture or acute facet abnormality is noted. Soft tissues and spinal canal: Surrounding soft tissue structures are within normal limits. Upper chest: Visualized lung apices are within normal limits. Other: None IMPRESSION: CT of the head: Geographic area of decreased attenuation in the left frontal lobe suspicious for acute to subacute ischemia. A few punctate areas of increased attenuation are noted which may represent focal hemorrhage. MRI is recommended for further evaluation. CT of the cervical spine: No acute abnormality noted. Electronically Signed: By: Inez Catalina M.D. On: 12/13/2019 01:21   MR BRAIN W WO CONTRAST  Result Date: 12/13/2019 CLINICAL DATA:  Abnormal CT history fall while roller-skating with headache and neck pain EXAM: MRI HEAD WITHOUT AND WITH CONTRAST TECHNIQUE: Multiplanar, multiecho pulse sequences of the brain and surrounding structures were obtained without and with intravenous contrast. CONTRAST:  79m GADAVIST GADOBUTROL 1 MMOL/ML IV SOLN COMPARISON:  Correlation made with prior head CT FINDINGS: Brain: There is mildly expansile T2 hyperintensity within the anterior left frontal lobe involving the superior frontal gyrus. A few punctate foci of susceptibility present likely correspond to punctate hyperdensity on CT and are compatible with mineralization or microhemorrhage. There is no associated diffusion restriction. There is no abnormal enhancement identified. No significant mass effect. There is no hydrocephalus or extra-axial fluid  collection. Vascular: Major vessel flow voids at the skull base are preserved. Skull and upper cervical spine: Normal marrow signal is preserved. Sinuses/Orbits: Paranasal sinuses are aerated. Orbits are unremarkable. Other: Sella is unremarkable.  Mastoid air cells are clear. 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 Electronically Signed   By: PMacy MisM.D.   On: 12/13/2019 09:41   DG Tibia/Fibula Right Port  Result Date: 12/13/2019 CLINICAL DATA:  Postop internal fixation spiral fracture EXAM: PORTABLE RIGHT TIBIA AND FIBULA - 2 VIEW COMPARISON:  None. FINDINGS: In cast views of the right lower leg demonstrates intramedullary nail across the tibial fracture. Near anatomic alignment. Near anatomic alignment across the distal fibular fracture. IMPRESSION: Internal fixation with near anatomic alignment. No visible complicating feature. Electronically Signed   By: KRolm BaptiseM.D.   On: 12/13/2019 15:25   DG Foot Complete Left  Result Date: 12/13/2019 CLINICAL DATA:  Fall EXAM: LEFT FOOT - COMPLETE 3+ VIEW COMPARISON:  None. FINDINGS: There is no evidence of fracture or dislocation. There is no evidence of arthropathy or other focal bone abnormality. Soft tissues are unremarkable. IMPRESSION: No acute abnormality noted. Electronically Signed   By: MInez CatalinaM.D.   On: 12/13/2019 01:34   DG Foot Complete Right  Result Date: 12/13/2019 CLINICAL DATA:  Fall while roller-skating with right foot pain, initial encounter EXAM: RIGHT FOOT COMPLETE - 3+ VIEW COMPARISON:  None. FINDINGS: Oblique fracture of the mid to distal tibia is noted incompletely evaluated on this exam. Distal fibular fracture is noted as well. No fracture in the foot is seen. IMPRESSION: Distal tibial and fibular fractures. The foot is within normal limits. Electronically Signed   By: MInez Catalina M.D.   On: 12/13/2019 01:27   DG C-Arm 1-60 Min  Result Date: 12/13/2019 CLINICAL DATA:  IM nail EXAM: RIGHT TIBIA AND FIBULA - 2 VIEW; DG C-ARM 1-60 MIN COMPARISON:  12/13/2019 FINDINGS: Placement of IM nail across  the right tibial fracture. Near anatomic alignment. Improved alignment at the fibular fracture. IMPRESSION: IM nail within the tibia across the tibial fracture with improved alignment. Improved alignment at the fibular fracture. No complicating feature. Electronically Signed   By: Rolm Baptise M.D.   On: 12/13/2019 15:14   DG Femur Min 2 Views Left  Result Date: 12/13/2019 CLINICAL DATA:  Fall while roller-skating with left leg pain, initial encounter EXAM: LEFT FEMUR 2 VIEWS COMPARISON:  None. FINDINGS: There is no evidence of fracture or other focal bone lesions. Soft tissues are unremarkable. IMPRESSION: No acute abnormality noted. Electronically Signed   By: Inez Catalina M.D.   On: 12/13/2019 01:28   DG Femur Min 2 Views Right  Result Date: 12/13/2019 CLINICAL DATA:  Fall while roller-skating with right leg pain, initial encounter EXAM: RIGHT FEMUR 2 VIEWS COMPARISON:  None. FINDINGS: There is no evidence of fracture or other focal bone lesions. Soft tissues are unremarkable. IMPRESSION: No acute abnormality noted. Electronically Signed   By: Inez Catalina M.D.   On: 12/13/2019 01:22     Assessment/Plan Brain Mass  We appreciate the opportunity to participate in the care of [place].  She presents today with clinical and radiographic syndrome localizing to the left frontal lobe.  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    We will plan to discuss her case in brain/spine tumor board this coming week.  Likely recommendation will include gross resection and thorough histological analysis.  For now recommend withholding corticosteroids and anti-epileptics.    Screening for potential clinical trials was performed and discussed  using eligibility criteria for active protocols at [facility], loco-regional tertiary centers, as well as national database available on directyarddecor.com.    The patient is not a candidate for a research protocol at this time due to no suitable study identified.   We spent twenty additional minutes teaching regarding the natural history, biology, and historical experience in the treatment of brain tumors. We then discussed in detail the current recommendations for therapy focusing on the mode of administration, mechanism of action, anticipated toxicities, and quality of life issues associated with this plan. We also provided teaching sheets for the patient to take home as an additional resource.  We will plan to follow up with her following surgery once pathology is returned.  All questions were answered. The patient knows to call the clinic with any problems, questions or concerns. No barriers to learning were detected.  The total time spent in the encounter was 60 minutes and more than 50% was on counseling and review of test results   Ventura Sellers, MD Medical Director of Neuro-Oncology [facility] at [place] 12/20/19 12:23 PM

## 2019-12-27 ENCOUNTER — Other Ambulatory Visit (HOSPITAL_COMMUNITY): Payer: Self-pay | Admitting: Neurological Surgery

## 2019-12-27 ENCOUNTER — Other Ambulatory Visit: Payer: Self-pay | Admitting: Neurological Surgery

## 2019-12-27 DIAGNOSIS — D496 Neoplasm of unspecified behavior of brain: Secondary | ICD-10-CM

## 2019-12-29 ENCOUNTER — Encounter: Payer: Self-pay | Admitting: Internal Medicine

## 2019-12-31 ENCOUNTER — Other Ambulatory Visit: Payer: Self-pay | Admitting: Neurological Surgery

## 2019-12-31 DIAGNOSIS — S82831D Other fracture of upper and lower end of right fibula, subsequent encounter for closed fracture with routine healing: Secondary | ICD-10-CM | POA: Diagnosis not present

## 2019-12-31 DIAGNOSIS — S82201D Unspecified fracture of shaft of right tibia, subsequent encounter for closed fracture with routine healing: Secondary | ICD-10-CM | POA: Diagnosis not present

## 2020-01-01 ENCOUNTER — Ambulatory Visit (HOSPITAL_COMMUNITY)
Admission: RE | Admit: 2020-01-01 | Discharge: 2020-01-01 | Disposition: A | Discharge status: Home / Self Care | Payer: Medicaid Other | Source: Ambulatory Visit | Attending: Neurological Surgery | Admitting: Neurological Surgery

## 2020-01-01 ENCOUNTER — Other Ambulatory Visit: Payer: Self-pay

## 2020-01-01 ENCOUNTER — Other Ambulatory Visit (HOSPITAL_COMMUNITY): Payer: Self-pay | Admitting: Neurological Surgery

## 2020-01-01 ENCOUNTER — Encounter (HOSPITAL_COMMUNITY): Payer: Self-pay | Admitting: *Deleted

## 2020-01-01 DIAGNOSIS — D496 Neoplasm of unspecified behavior of brain: Secondary | ICD-10-CM | POA: Diagnosis present

## 2020-01-01 MED ORDER — GADOBUTROL 1 MMOL/ML IV SOLN
5.0000 mL | Freq: Once | INTRAVENOUS | Status: AC | PRN
  Administered 2020-01-01: 5 mL via INTRAVENOUS

## 2020-01-02 ENCOUNTER — Ambulatory Visit (HOSPITAL_COMMUNITY): Payer: Medicaid Other | Attending: Internal Medicine | Admitting: Physical Therapy

## 2020-01-02 ENCOUNTER — Encounter (HOSPITAL_COMMUNITY): Payer: Self-pay | Admitting: Physical Therapy

## 2020-01-02 DIAGNOSIS — R2689 Other abnormalities of gait and mobility: Secondary | ICD-10-CM | POA: Diagnosis not present

## 2020-01-02 DIAGNOSIS — M25571 Pain in right ankle and joints of right foot: Secondary | ICD-10-CM | POA: Diagnosis not present

## 2020-01-02 DIAGNOSIS — M25671 Stiffness of right ankle, not elsewhere classified: Secondary | ICD-10-CM | POA: Insufficient documentation

## 2020-01-02 DIAGNOSIS — M25661 Stiffness of right knee, not elsewhere classified: Secondary | ICD-10-CM

## 2020-01-02 NOTE — Patient Instructions (Signed)
Heel Slides    Squeeze pelvic floor and hold. Slide left heel along bed towards bottom. Hold for _5__ seconds. Slide back to flat knee position. Repeat _10__ times. Do _2__ times a day. Repeat with other leg.    Copyright  VHI. All rights reserved.  Quad Set    With other leg bent, foot flat, slowly tighten muscles on thigh of straight leg while counting out loud to __5__. Repeat _10___ times. Do __2__ sessions per day.  http://[email or web address].exer.us/276   Copyright  VHI. All rights reserved.

## 2020-01-03 NOTE — Therapy (Signed)
[place], [place], [address] Phone: [phone]   Fax:  [phone]  Physical Therapy Evaluation  Patient Details  Name: Maureen Key MRN: [id number] Date of Birth: [birth year] Referring Provider (PT): Antonieta Pert, MD   Encounter Date: 01/02/2020   PT End of Session - 01/02/20 1640    Visit Number 1    Number of Visits 15    Date for PT Re-Evaluation 02/27/20    Authorization Type Medicaid    Authorization Time Period 3 visits requested Check approval    Authorization - Visit Number 0    Authorization - Number of Visits 3    Progress Note Due on Visit 10    PT Start Time 0815    PT Stop Time 0850    PT Time Calculation (min) 35 min    Activity Tolerance Patient tolerated treatment well;Patient limited by pain    Behavior During Therapy [facility] for tasks assessed/performed           Past Medical History:  Diagnosis Date  . Blood transfusion without reported diagnosis    last [place] delivery  . Dysmenorrhea   . History of placenta accreta in prior pregnancy, currently pregnant   . History of postpartum hemorrhage, currently pregnant   . History of sepsis   . Pregnancy induced hypertension   . Sickle cell trait ([place])   . Trichimoniasis   . Vaginal Pap smear, abnormal     Past Surgical History:  Procedure Laterality Date  . ABDOMINAL HYSTERECTOMY  05/12/2017   Procedure: HYSTERECTOMY ABDOMINAL;  Surgeon: Jonnie Kind, MD;  Location: [facility];  Service: Obstetrics;;  . CERVICAL CONE BIOPSY    . CESAREAN SECTION    . CESAREAN SECTION N/A 05/12/2017   Procedure: CESAREAN SECTION WITH Hysterectomy;  Surgeon: Jonnie Kind, MD;  Location: [facility];  Service: Obstetrics;  Laterality: N/A;  . TIBIA IM NAIL INSERTION Right 12/13/2019   Procedure: INTRAMEDULLARY (IM) NAIL TIBIAL;  Surgeon: Shona Needles, MD;  Location: [facility];  Service: Orthopedics;  Laterality: Right;    There were  no vitals filed for this visit.    Subjective Assessment - 01/02/20 0818    Subjective Patient reported that on 12/12/19 she was rollerblading and she fell and fractured her tibia and fibula on the right side. She stated that she then had an IM nail placed on 12/13/19. She was in a cast following the surgery however  on 12/31/19 she got the cast off and has since transitioned to a boot. Patient reported that she is still non-weightbearing on her right lower extremity. Patient reported that she is allowed to take her boot off and she has been doing heel slides as she was instructed by her MD. Patient reported that she has 4 kids and that she feels that do not fully understand that her movement is limited right now. 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 During the screenings following her fall, a tumor was noted and she has surgery planned to remove this on 02/04/20.    Pertinent History S/P IM nail to repair closed displaced spiral fracture of shaft of right tibia    Limitations Standing;Walking;House hold activities    Diagnostic tests X-rays and brain MRI    Patient Stated Goals To get strength back in her leg    Currently in Pain? Yes    Pain Score  8     Pain Location Ankle    Pain Orientation Right    Pain Descriptors / Indicators Throbbing;Aching    Pain Type Acute pain;Surgical pain    Pain Onset 1 to 4 weeks ago    Pain Frequency Constant    Aggravating Factors  Taking the boot off    Pain Relieving Factors Ice and elevation and Tylenol                 01/02/20 0001  Assessment  Medical Diagnosis Closed displaced spiral fracture of shaft of right tibia  Referring Provider (PT) Antonieta Pert, MD  Onset Date/Surgical Date 12/13/19  Next MD Visit 01/28/20  Prior Therapy None  Precautions  Required Braces or Orthoses Other Brace/Splint  Other Brace/Splint Boot on RT LE  Restrictions  Weight Bearing Restrictions Yes  RLE  Weight Bearing NWB  Balance Screen  Has the patient fallen in the past 6 months Yes  How many times? 1  Has the patient had a decrease in activity level because of a fear of falling?  Yes  Is the patient reluctant to leave their home because of a fear of falling?  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 RT ankle DF limited by 30% by observation  ROM / Strength  AROM / PROM / Strength Strength;AROM  AROM  AROM Assessment Site Knee  Right/Left Knee Right;Left  Right Knee Extension 0  Right Knee Flexion 89  Left Knee Extension 0  Left Knee Flexion 150  Strength  Strength Assessment Site Hip;Knee;Ankle  Right/Left Hip Right;Left  Right/Left Knee Right;Left  Right/Left Ankle Right;Left  Right Hip Flexion 3+/5  Left Hip Flexion 5/5  Right Knee Flexion 3/5  Right Knee Extension 3/5  Left Knee Flexion 5/5  Left Knee Extension 5/5  Left Ankle Dorsiflexion 5/5  Ambulation/Gait  Gait Comments NWB with RW into and out of clinic              Objective measurements completed on examination: See above findings.               PT Education - 01/02/20 1640    Education Details Discussed examination findings, POC, and initial HEP.    Person(s) Educated Patient    Methods Explanation;Handout    Comprehension Verbalized understanding            PT Short Term Goals - 01/02/20 1642      PT SHORT TERM GOAL #1   Title  Patient will report understanding and regular compliance with HEP to improve ROM, strength and overall functional mobility.    Time 4    Period Weeks    Status New    Target Date 01/30/20      PT SHORT TERM GOAL #2   Title Patient will demonstrate RT knee flexion of at least 100 degrees in order to improve ease of ascending and descending stairs once cleared by MD.    Time 4    Period Weeks  Status New    Target Date 01/30/20             PT Long Term Goals - 01/02/20 1645      PT LONG TERM GOAL #1   Title Patient will demonstrate RT knee flexion of at least 130 degrees in order to improve ease of ascending and descending stairs once cleared by MD.    Time 8    Period Weeks    Status New    Target Date 02/27/20      PT LONG TERM GOAL #2   Title Patient will demonstrate ability to ambulate without assistive device with gait speed of at least 1.0 m/s in order to return to work activities.    Time 8    Period Weeks    Status New    Target Date 02/27/20      PT LONG TERM GOAL #3   Title Patient will demonstrate WFL RT ankle ROM to improve gait mechanics and safety.    Time 8    Period Weeks    Status New    Target Date 02/27/20      PT LONG TERM GOAL #4   Title Patient will demonstrate improvement of at least 1/2 MMT strength grade in all deficient muscle groups in order to improve ease of navigating stairs.    Time 8    Period Weeks    Status New    Target Date 02/27/20      PT LONG TERM GOAL #5   Title Patient will report an overall improvment of at least 50% in subjective complaint in order for improved QoL.    Time 8    Period Weeks    Status New    Target Date 02/27/20                  Plan - 01/02/20 0757    Clinical Impression Statement Patient is a 39 year old female who presents to outpatient physical therapy S/P IM nail to repair closed displaced spiral fracture of right tibia on 12/13/19. 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 Patient presents with decreased LE strength, decreased functional mobility, pain and decreased ankle and knee ROM on the right side. Patient would benefit from continued skilled physical therapy in order to address the abovementioned deficits and help patient return to her PLOF.    Personal Factors and Comorbidities Comorbidity 1    Comorbidities Frontal lobe brain mass    Examination-Activity Limitations Bathing;Lift;Stand;Bed Mobility;Locomotion Level;Toileting;Bend;Transfers;Dressing;Squat;Stairs    Examination-Participation Restrictions Yard Work;Cleaning;Meal Prep;Community Activity;Driving;Laundry    Stability/Clinical Decision Making Evolving/Moderate complexity    Clinical Decision Making Moderate    Rehab Potential Good    PT Frequency 2x / week   1x/week for first 3 weeks and then 2x/week   PT Duration 8 weeks    PT Treatment/Interventions ADLs/Self Care Home Management;Aquatic Therapy;Cryotherapy;Electrical Stimulation;Moist Heat;DME Instruction;Gait training;Stair training;Functional mobility training;Therapeutic activities;Therapeutic exercise;Balance training;Neuromuscular re-education;Patient/family education;Orthotic Fit/Training;Manual techniques;Scar mobilization;Passive range of motion;Dry needling;Energy conservation;Taping    PT Next Visit Plan Verify any precautions or restrictions. Patient NWB currently. Work on improving knee flexion AROM, pain relief and improving strength.    PT Home Exercise Plan 01/02/20: Quad set, heel slide    Consulted and Agree with Plan of Care Patient           Patient will benefit from skilled therapeutic intervention in order to improve the following deficits  and impairments:  Abnormal gait, Improper body mechanics, Pain, Decreased mobility, Decreased scar mobility, Decreased activity tolerance, Decreased endurance, Decreased range of motion, Decreased strength, Hypomobility,  Decreased balance, Difficulty walking  Visit Diagnosis: Stiffness of right knee, not elsewhere classified  Stiffness of right ankle, not elsewhere classified  Pain in right ankle and joints of right foot  Other abnormalities of gait and mobility     Problem List Patient Active Problem List   Diagnosis Date Noted  . Brain mass 12/20/2019  . Closed displaced spiral fracture of shaft of right tibia 12/13/2019  . Alcohol abuse with intoxication ([place]) 12/13/2019  . Closed fracture of right distal fibula 12/13/2019  . Displaced comminuted fracture of shaft of right tibia, initial encounter for closed fracture 12/12/2019  . H/O abdominal supracervical subtotal hysterectomy 06/27/2017  . Status post emergency cesarean hysterectomy 05/12/2017  . History of acute respiratory failure 05/08/2017  . History of postpartum hemorrhage 05/08/2017  . Trichimoniasis 01/18/2017   Clarene Critchley PT, DPT 8:52 AM, 01/03/20 [place], [place], [address] Phone: [phone]   Fax:  [phone]  Name: Maureen Key MRN: [id number] Date of Birth: July 03, 1981

## 2020-01-06 ENCOUNTER — Ambulatory Visit (HOSPITAL_COMMUNITY): Payer: Medicaid Other | Admitting: Physical Therapy

## 2020-01-06 ENCOUNTER — Other Ambulatory Visit: Payer: Self-pay

## 2020-01-06 ENCOUNTER — Encounter (HOSPITAL_COMMUNITY): Payer: Self-pay | Admitting: Physical Therapy

## 2020-01-06 DIAGNOSIS — M25661 Stiffness of right knee, not elsewhere classified: Secondary | ICD-10-CM | POA: Diagnosis not present

## 2020-01-06 DIAGNOSIS — R2689 Other abnormalities of gait and mobility: Secondary | ICD-10-CM | POA: Diagnosis not present

## 2020-01-06 DIAGNOSIS — M25571 Pain in right ankle and joints of right foot: Secondary | ICD-10-CM | POA: Diagnosis not present

## 2020-01-06 DIAGNOSIS — M25671 Stiffness of right ankle, not elsewhere classified: Secondary | ICD-10-CM

## 2020-01-06 NOTE — Therapy (Signed)
[place], [place], [address] Phone: [phone]   Fax:  [phone]  Physical Therapy Treatment  Patient Details  Name: Maureen Key MRN: [id number] Date of Birth: 1980/10/16 Referring Provider (PT): Antonieta Pert, MD   Encounter Date: 01/06/2020   PT End of Session - 01/06/20 0834    Visit Number 2    Number of Visits 15    Date for PT Re-Evaluation 02/27/20    Authorization Type Medicaid    Authorization Time Period 3 visits approved 01/06/20 to 01/26/20    Authorization - Visit Number 1    Authorization - Number of Visits 3    Progress Note Due on Visit 10    PT Start Time 0831    PT Stop Time 0910    PT Time Calculation (min) 39 min    Activity Tolerance Patient tolerated treatment well;Patient limited by pain    Behavior During Therapy [facility] for tasks assessed/performed           Past Medical History:  Diagnosis Date  . Blood transfusion without reported diagnosis    last [place] delivery  . Dysmenorrhea   . History of placenta accreta in prior pregnancy, currently pregnant   . History of postpartum hemorrhage, currently pregnant   . History of sepsis   . Pregnancy induced hypertension   . Sickle cell trait ([place])   . Trichimoniasis   . Vaginal Pap smear, abnormal     Past Surgical History:  Procedure Laterality Date  . ABDOMINAL HYSTERECTOMY  05/12/2017   Procedure: HYSTERECTOMY ABDOMINAL;  Surgeon: Jonnie Kind, MD;  Location: [facility];  Service: Obstetrics;;  . CERVICAL CONE BIOPSY    . CESAREAN SECTION    . CESAREAN SECTION N/A 05/12/2017   Procedure: CESAREAN SECTION WITH Hysterectomy;  Surgeon: Jonnie Kind, MD;  Location: [facility];  Service: Obstetrics;  Laterality: N/A;  . TIBIA IM NAIL INSERTION Right 12/13/2019   Procedure: INTRAMEDULLARY (IM) NAIL TIBIAL;  Surgeon: Shona Needles, MD;  Location: [facility];  Service: Orthopedics;  Laterality: Right;    There  were no vitals filed for this visit.   Subjective Assessment - 01/06/20 0852    Subjective Reports no pain. States she is feeling good.    Pertinent History S/P IM nail to repair closed displaced spiral fracture of shaft of right tibia    Limitations Standing;Walking;House hold activities    Diagnostic tests X-rays and brain MRI    Patient Stated Goals To get strength back in her leg    Pain Onset 1 to 4 weeks ago              [facility] PT Assessment - 01/06/20 0001      Assessment   Medical Diagnosis Closed displaced spiral fracture of shaft of right tibia    Referring Provider (PT) Antonieta Pert, MD    Onset Date/Surgical Date 12/13/19    Next MD Visit 01/28/20      AROM   Right Knee Extension 0    Right Knee Flexion 120                         [facility] Adult PT Treatment/Exercise - 01/06/20 0001      Exercises   Exercises Knee/Hip      Knee/Hip Exercises: Stretches   Gastroc Stretch Right;4 reps;60 seconds   with strap     Knee/Hip Exercises: Supine  Heel Slides AROM;Right;20 reps    Straight Leg Raises AROM;Strengthening;Right;10 reps;3 sets   5" holds     Manual Therapy   Manual Therapy Soft tissue mobilization    Manual therapy comments all manual interventions performed ind    Soft tissue mobilization STM and scar mobilization to R LE - tolerated well - felt good                  PT Education - 01/06/20 0852    Education Details in not submerging her leg in the pool as incisions are still open/new. Educated patient in self mobilization/gentle scar massage    Person(s) Educated Patient    Methods Explanation    Comprehension Verbalized understanding            PT Short Term Goals - 01/02/20 1642      PT SHORT TERM GOAL #1   Title Patient will report understanding and regular compliance with HEP to improve ROM, strength and overall functional mobility.    Time 4    Period Weeks    Status New    Target Date 01/30/20      PT SHORT TERM  GOAL #2   Title Patient will demonstrate RT knee flexion of at least 100 degrees in order to improve ease of ascending and descending stairs once cleared by MD.    Time 4    Period Weeks    Status New    Target Date 01/30/20             PT Long Term Goals - 01/02/20 1645      PT LONG TERM GOAL #1   Title Patient will demonstrate RT knee flexion of at least 130 degrees in order to improve ease of ascending and descending stairs once cleared by MD.    Time 8    Period Weeks    Status New    Target Date 02/27/20      PT LONG TERM GOAL #2   Title Patient will demonstrate ability to ambulate without assistive device with gait speed of at least 1.0 m/s in order to return to work activities.    Time 8    Period Weeks    Status New    Target Date 02/27/20      PT LONG TERM GOAL #3   Title Patient will demonstrate WFL RT ankle ROM to improve gait mechanics and safety.    Time 8    Period Weeks    Status New    Target Date 02/27/20      PT LONG TERM GOAL #4   Title Patient will demonstrate improvement of at least 1/2 MMT strength grade in all deficient muscle groups in order to improve ease of navigating stairs.    Time 8    Period Weeks    Status New    Target Date 02/27/20      PT LONG TERM GOAL #5   Title Patient will report an overall improvment of at least 50% in subjective complaint in order for improved QoL.    Time 8    Period Weeks    Status New    Target Date 02/27/20                 Plan - 01/06/20 0843    Clinical Impression Statement Focused on progressing table exercises and educating patient in post op restrictions and self mobilizations. Answered all questions and discussed upcoming surgery for tumor removal. Cancelled follow up  visits following tumor removal surgery secondary to unknown status at the point and likely being in hospital for 2-3 day post operative per MD. Will continue to focus on unweighted table exercises as tolerated.    Personal  Factors and Comorbidities Comorbidity 1    Comorbidities Frontal lobe brain mass    Examination-Activity Limitations Bathing;Lift;Stand;Bed Mobility;Locomotion Level;Toileting;Bend;Transfers;Dressing;Squat;Stairs    Examination-Participation Restrictions Yard Work;Cleaning;Meal Prep;Community Activity;Driving;Laundry    Stability/Clinical Decision Making Evolving/Moderate complexity    Rehab Potential Good    PT Frequency 2x / week   1x/week for first 3 weeks and then 2x/week   PT Duration 8 weeks    PT Treatment/Interventions ADLs/Self Care Home Management;Aquatic Therapy;Cryotherapy;Electrical Stimulation;Moist Heat;DME Instruction;Gait training;Stair training;Functional mobility training;Therapeutic activities;Therapeutic exercise;Balance training;Neuromuscular re-education;Patient/family education;Orthotic Fit/Training;Manual techniques;Scar mobilization;Passive range of motion;Dry needling;Energy conservation;Taping    PT Next Visit Plan Verify any precautions or restrictions. Patient NWB currently. Work on improving knee flexion AROM, pain relief and improving strength.    PT Home Exercise Plan 01/02/20: Quad set, heel slide; 6/28 SLR, gastroc stretch    Consulted and Agree with Plan of Care Patient           Patient will benefit from skilled therapeutic intervention in order to improve the following deficits and impairments:  Abnormal gait, Improper body mechanics, Pain, Decreased mobility, Decreased scar mobility, Decreased activity tolerance, Decreased endurance, Decreased range of motion, Decreased strength, Hypomobility, Decreased balance, Difficulty walking  Visit Diagnosis: Stiffness of right knee, not elsewhere classified  Stiffness of right ankle, not elsewhere classified  Pain in right ankle and joints of right foot  Other abnormalities of gait and mobility     Problem List Patient Active Problem List   Diagnosis Date Noted  . Brain mass 12/20/2019  . Closed  displaced spiral fracture of shaft of right tibia 12/13/2019  . Alcohol abuse with intoxication ([place]) 12/13/2019  . Closed fracture of right distal fibula 12/13/2019  . Displaced comminuted fracture of shaft of right tibia, initial encounter for closed fracture 12/12/2019  . H/O abdominal supracervical subtotal hysterectomy 06/27/2017  . Status post emergency cesarean hysterectomy 05/12/2017  . History of acute respiratory failure 05/08/2017  . History of postpartum hemorrhage 05/08/2017  . Trichimoniasis 01/18/2017    9:14 AM, 01/06/20 Jerene Pitch, DPT Physical Therapy with [facility]  [phone] office  [place] [address] [place], [place], [address] Phone: [phone]   Fax:  [phone]  Name: ASHANTI RATTI MRN: [id number] Date of Birth: 06-21-1981

## 2020-01-07 ENCOUNTER — Telehealth (HOSPITAL_COMMUNITY): Payer: Self-pay | Admitting: Physical Therapy

## 2020-01-07 NOTE — Telephone Encounter (Signed)
Update called patient about her insurance - she states she has purchased [employer] and should get her card before she returns for tx in July. We will have to call to get Benefits once she brings in the card and we can added it to epic

## 2020-01-09 ENCOUNTER — Encounter (HOSPITAL_COMMUNITY): Payer: Self-pay | Admitting: Physical Therapy

## 2020-01-09 ENCOUNTER — Other Ambulatory Visit: Payer: Self-pay | Admitting: Radiation Therapy

## 2020-01-17 ENCOUNTER — Other Ambulatory Visit: Payer: Self-pay

## 2020-01-17 ENCOUNTER — Ambulatory Visit (HOSPITAL_COMMUNITY): Payer: 59 | Attending: Internal Medicine | Admitting: Physical Therapy

## 2020-01-17 ENCOUNTER — Encounter (HOSPITAL_COMMUNITY): Payer: Self-pay | Admitting: Physical Therapy

## 2020-01-17 DIAGNOSIS — M25571 Pain in right ankle and joints of right foot: Secondary | ICD-10-CM | POA: Diagnosis not present

## 2020-01-17 DIAGNOSIS — R2689 Other abnormalities of gait and mobility: Secondary | ICD-10-CM | POA: Diagnosis not present

## 2020-01-17 DIAGNOSIS — M25671 Stiffness of right ankle, not elsewhere classified: Secondary | ICD-10-CM | POA: Diagnosis not present

## 2020-01-17 DIAGNOSIS — M25661 Stiffness of right knee, not elsewhere classified: Secondary | ICD-10-CM | POA: Diagnosis not present

## 2020-01-17 NOTE — Therapy (Signed)
[name], [place], [address] Phone: [phone]   Fax:  [phone]  Physical Therapy Treatment  Patient Details  Name: Maureen Key MRN: [id number] Date of Birth: 03/25/1981 Referring Provider (PT): Antonieta Pert, MD   Encounter Date: 01/17/2020   PT End of Session - 01/17/20 0952    Visit Number 3    Number of Visits 15    Date for PT Re-Evaluation 02/27/20    Authorization Type Medicaid    Authorization Time Period 3 visits approved 01/06/20 to 01/26/20    Authorization - Visit Number 2    Authorization - Number of Visits 3    Progress Note Due on Visit 10    PT Start Time 0945    PT Stop Time 1027    PT Time Calculation (min) 42 min    Activity Tolerance Patient tolerated treatment well;Patient limited by pain    Behavior During Therapy [facility] for tasks assessed/performed           Past Medical History:  Diagnosis Date  . Blood transfusion without reported diagnosis    last [place] delivery  . Dysmenorrhea   . History of placenta accreta in prior pregnancy, currently pregnant   . History of postpartum hemorrhage, currently pregnant   . History of sepsis   . Pregnancy induced hypertension   . Sickle cell trait ([place])   . Trichimoniasis   . Vaginal Pap smear, abnormal     Past Surgical History:  Procedure Laterality Date  . ABDOMINAL HYSTERECTOMY  05/12/2017   Procedure: HYSTERECTOMY ABDOMINAL;  Surgeon: Jonnie Kind, MD;  Location: [place];  Service: Obstetrics;;  . CERVICAL CONE BIOPSY    . CESAREAN SECTION    . CESAREAN SECTION N/A 05/12/2017   Procedure: CESAREAN SECTION WITH Hysterectomy;  Surgeon: Jonnie Kind, MD;  Location: [place];  Service: Obstetrics;  Laterality: N/A;  . TIBIA IM NAIL INSERTION Right 12/13/2019   Procedure: INTRAMEDULLARY (IM) NAIL TIBIAL;  Surgeon: Shona Needles, MD;  Location: [place];  Service: Orthopedics;  Laterality: Right;    There  were no vitals filed for this visit.   Subjective Assessment - 01/17/20 0949    Subjective Patient reports she is feeling good. She states she is ready to walk. She stated that she is going back to see her MD next week.    Pertinent History S/P IM nail to repair closed displaced spiral fracture of shaft of right tibia    Limitations Standing;Walking;House hold activities    Diagnostic tests X-rays and brain MRI    Patient Stated Goals To get strength back in her leg    Currently in Pain? No/denies                             [facility] Adult PT Treatment/Exercise - 01/17/20 0001      Knee/Hip Exercises: Stretches   Gastroc Stretch Right;4 reps;60 seconds   with towel     Knee/Hip Exercises: Supine   Heel Slides AROM;Right;20 reps    Straight Leg Raises AROM;Strengthening;Right;10 reps;3 sets   5'' holds     Manual Therapy   Manual Therapy Soft tissue mobilization    Manual therapy comments all manual interventions performed independently of other skilled interventions    Soft tissue mobilization STM and scar mobilization to R LE  PT Short Term Goals - 01/02/20 1642      PT SHORT TERM GOAL #1   Title Patient will report understanding and regular compliance with HEP to improve ROM, strength and overall functional mobility.    Time 4    Period Weeks    Status New    Target Date 01/30/20      PT SHORT TERM GOAL #2   Title Patient will demonstrate RT knee flexion of at least 100 degrees in order to improve ease of ascending and descending stairs once cleared by MD.    Time 4    Period Weeks    Status New    Target Date 01/30/20             PT Long Term Goals - 01/02/20 1645      PT LONG TERM GOAL #1   Title Patient will demonstrate RT knee flexion of at least 130 degrees in order to improve ease of ascending and descending stairs once cleared by MD.    Time 8    Period Weeks    Status New    Target Date 02/27/20      PT  LONG TERM GOAL #2   Title Patient will demonstrate ability to ambulate without assistive device with gait speed of at least 1.0 m/s in order to return to work activities.    Time 8    Period Weeks    Status New    Target Date 02/27/20      PT LONG TERM GOAL #3   Title Patient will demonstrate WFL RT ankle ROM to improve gait mechanics and safety.    Time 8    Period Weeks    Status New    Target Date 02/27/20      PT LONG TERM GOAL #4   Title Patient will demonstrate improvement of at least 1/2 MMT strength grade in all deficient muscle groups in order to improve ease of navigating stairs.    Time 8    Period Weeks    Status New    Target Date 02/27/20      PT LONG TERM GOAL #5   Title Patient will report an overall improvment of at least 50% in subjective complaint in order for improved QoL.    Time 8    Period Weeks    Status New    Target Date 02/27/20                 Plan - 01/17/20 1146    Clinical Impression Statement Continued to focus on nonweightbearing strengthening and ROM exercises for the knee and ankle on the table. This session added sidelying hip abduction. Provided cueing to improve alignment of hips to improve hip abductor activation. Discussed with patient plans to continue therapy until patient's surgery with plan to put patient on hold at that time. Did discuss with patient continuing to stay non weightbearing until she hears otherwise from her MD.    Personal Factors and Comorbidities Comorbidity 1    Comorbidities Frontal lobe brain mass    Examination-Activity Limitations Bathing;Lift;Stand;Bed Mobility;Locomotion Level;Toileting;Bend;Transfers;Dressing;Squat;Stairs    Examination-Participation Restrictions Yard Work;Cleaning;Meal Prep;Community Activity;Driving;Laundry    Stability/Clinical Decision Making Evolving/Moderate complexity    Rehab Potential Good    PT Frequency 2x / week   1x/week for first 3 weeks and then 2x/week   PT Duration 8  weeks    PT Treatment/Interventions ADLs/Self Care Home Management;Aquatic Therapy;Cryotherapy;Electrical Stimulation;Moist Heat;DME Instruction;Gait training;Stair training;Functional mobility training;Therapeutic activities;Therapeutic exercise;Balance  training;Neuromuscular re-education;Patient/family education;Orthotic Fit/Training;Manual techniques;Scar mobilization;Passive range of motion;Dry needling;Energy conservation;Taping    PT Next Visit Plan Patient's surgeon reported all ROM exercises are allowed. Follow-up on if patient's weightbearing status has changed. Work on improving knee flexion AROM, pain relief and improving strength.    PT Home Exercise Plan 01/02/20: Quad set, heel slide; 6/28 SLR, gastroc stretch    Consulted and Agree with Plan of Care Patient           Patient will benefit from skilled therapeutic intervention in order to improve the following deficits and impairments:  Abnormal gait, Improper body mechanics, Pain, Decreased mobility, Decreased scar mobility, Decreased activity tolerance, Decreased endurance, Decreased range of motion, Decreased strength, Hypomobility, Decreased balance, Difficulty walking  Visit Diagnosis: Stiffness of right knee, not elsewhere classified  Stiffness of right ankle, not elsewhere classified  Pain in right ankle and joints of right foot  Other abnormalities of gait and mobility     Problem List Patient Active Problem List   Diagnosis Date Noted  . Brain mass 12/20/2019  . Closed displaced spiral fracture of shaft of right tibia 12/13/2019  . Alcohol abuse with intoxication ([place]) 12/13/2019  . Closed fracture of right distal fibula 12/13/2019  . Displaced comminuted fracture of shaft of right tibia, initial encounter for closed fracture 12/12/2019  . H/O abdominal supracervical subtotal hysterectomy 06/27/2017  . Status post emergency cesarean hysterectomy 05/12/2017  . History of acute respiratory failure 05/08/2017  .  History of postpartum hemorrhage 05/08/2017  . Trichimoniasis 01/18/2017   Clarene Critchley PT, DPT 12:00 PM, 01/17/20 [place], [place], [address] Phone: [phone]   Fax:  [phone]  Name: BRITTA LOUTH MRN: [id number] Date of Birth: Mar 01, 1981

## 2020-01-23 ENCOUNTER — Ambulatory Visit (HOSPITAL_COMMUNITY): Payer: 59 | Admitting: Physical Therapy

## 2020-01-23 ENCOUNTER — Other Ambulatory Visit: Payer: Self-pay

## 2020-01-23 ENCOUNTER — Encounter (HOSPITAL_COMMUNITY): Payer: Self-pay | Admitting: Physical Therapy

## 2020-01-23 DIAGNOSIS — M25571 Pain in right ankle and joints of right foot: Secondary | ICD-10-CM

## 2020-01-23 DIAGNOSIS — M25661 Stiffness of right knee, not elsewhere classified: Secondary | ICD-10-CM | POA: Diagnosis not present

## 2020-01-23 DIAGNOSIS — M25671 Stiffness of right ankle, not elsewhere classified: Secondary | ICD-10-CM

## 2020-01-23 DIAGNOSIS — R2689 Other abnormalities of gait and mobility: Secondary | ICD-10-CM

## 2020-01-23 NOTE — Therapy (Signed)
[place], [place], [address] Phone: [phone]   Fax:  [phone]  Physical Therapy Treatment  Patient Details  Name: Maureen Key MRN: [id number] Date of Birth: 1981-03-11 Referring Provider (PT): Antonieta Pert, MD   Encounter Date: 01/23/2020   PT End of Session - 01/23/20 0917    Visit Number 4    Number of Visits 15    Date for PT Re-Evaluation 02/27/20    Authorization Type [employer], patient does not have medicaid    Authorization Time Period [employer] 7/1-12/31/2021 Visit limit 30- 0 used combined PT/OT/ChiroNo Auth req with codes given AQ is covered    Authorization - Visit Number 2    Authorization - Number of Visits 30    Progress Note Due on Visit 10    PT Start Time 0917    PT Stop Time 0955    PT Time Calculation (min) 38 min    Activity Tolerance Patient tolerated treatment well;Patient limited by pain    Behavior During Therapy [facility] for tasks assessed/performed           Past Medical History:  Diagnosis Date  . Blood transfusion without reported diagnosis    last [place] delivery  . Dysmenorrhea   . History of placenta accreta in prior pregnancy, currently pregnant   . History of postpartum hemorrhage, currently pregnant   . History of sepsis   . Pregnancy induced hypertension   . Sickle cell trait ([place])   . Trichimoniasis   . Vaginal Pap smear, abnormal     Past Surgical History:  Procedure Laterality Date  . ABDOMINAL HYSTERECTOMY  05/12/2017   Procedure: HYSTERECTOMY ABDOMINAL;  Surgeon: Jonnie Kind, MD;  Location: [place];  Service: Obstetrics;;  . CERVICAL CONE BIOPSY    . CESAREAN SECTION    . CESAREAN SECTION N/A 05/12/2017   Procedure: CESAREAN SECTION WITH Hysterectomy;  Surgeon: Jonnie Kind, MD;  Location: [place];  Service: Obstetrics;  Laterality: N/A;  . TIBIA IM NAIL INSERTION Right 12/13/2019   Procedure: INTRAMEDULLARY (IM) NAIL  TIBIAL;  Surgeon: Shona Needles, MD;  Location: [place];  Service: Orthopedics;  Laterality: Right;    There were no vitals filed for this visit.   Subjective Assessment - 01/23/20 0946    Subjective Saw MD and is able to weight bear in boot and then on Monday 7/19 she will be able to put her foot in a shoe as tolerated. no  pain currently just tenderness  in ankle.    Pertinent History S/P IM nail to repair closed displaced spiral fracture of shaft of right tibia    Limitations Standing;Walking;House hold activities    Diagnostic tests X-rays and brain MRI    Patient Stated Goals To get strength back in her leg    Currently in Pain? Yes    Pain Score 3     Pain Location Ankle    Pain Orientation Right    Pain Descriptors / Indicators Sore    Pain Type Acute pain;Surgical pain              [facility] PT Assessment - 01/23/20 0001      Assessment   Medical Diagnosis Closed displaced spiral fracture of shaft of right tibia    Referring Provider (PT) Antonieta Pert, MD    Onset Date/Surgical Date 12/13/19    Next MD Visit 03/03/20      Precautions   Other Brace/Splint  WBAT in boot until 7/19- then                          [facility] Adult PT Treatment/Exercise - 01/23/20 0001      Knee/Hip Exercises: Standing   Other Standing Knee Exercises standing weightbearing circles 3x10 clockwise and counterclockwise; forward and backward weight shifts x20 Each     Other Standing Knee Exercises standing weight shifts - black foam - 10 minutes total - 5-30 second holds - as tolerated - 0-40% WB R       Knee/Hip Exercises: Seated   Long Arc Quad 15 reps;Right;2 sets;AROM    Other Seated Knee/Hip Exercises ankle ROM and toew ROM seated 5 minutes R                     PT Short Term Goals - 01/02/20 1642      PT SHORT TERM GOAL #1   Title Patient will report understanding and regular compliance with HEP to improve ROM, strength and overall functional mobility.    Time 4     Period Weeks    Status New    Target Date 01/30/20      PT SHORT TERM GOAL #2   Title Patient will demonstrate RT knee flexion of at least 100 degrees in order to improve ease of ascending and descending stairs once cleared by MD.    Time 4    Period Weeks    Status New    Target Date 01/30/20             PT Long Term Goals - 01/02/20 1645      PT LONG TERM GOAL #1   Title Patient will demonstrate RT knee flexion of at least 130 degrees in order to improve ease of ascending and descending stairs once cleared by MD.    Time 8    Period Weeks    Status New    Target Date 02/27/20      PT LONG TERM GOAL #2   Title Patient will demonstrate ability to ambulate without assistive device with gait speed of at least 1.0 m/s in order to return to work activities.    Time 8    Period Weeks    Status New    Target Date 02/27/20      PT LONG TERM GOAL #3   Title Patient will demonstrate WFL RT ankle ROM to improve gait mechanics and safety.    Time 8    Period Weeks    Status New    Target Date 02/27/20      PT LONG TERM GOAL #4   Title Patient will demonstrate improvement of at least 1/2 MMT strength grade in all deficient muscle groups in order to improve ease of navigating stairs.    Time 8    Period Weeks    Status New    Target Date 02/27/20      PT LONG TERM GOAL #5   Title Patient will report an overall improvment of at least 50% in subjective complaint in order for improved QoL.    Time 8    Period Weeks    Status New    Target Date 02/27/20                 Plan - 01/23/20 0948    Clinical Impression Statement Session focused on increasing weightbearing in R lower extremity. Started with low percentage and then  gradually increased as tolerated. Instructed patient to focus on this over the weekend to prior to transitioning to shoe full time. Answered all questions. Will continue to focus on weightbearing tolerance and walking mechanics as tolerated.     Personal Factors and Comorbidities Comorbidity 1    Comorbidities Frontal lobe brain mass    Examination-Activity Limitations Bathing;Lift;Stand;Bed Mobility;Locomotion Level;Toileting;Bend;Transfers;Dressing;Squat;Stairs    Examination-Participation Restrictions Yard Work;Cleaning;Meal Prep;Community Activity;Driving;Laundry    Stability/Clinical Decision Making Evolving/Moderate complexity    Rehab Potential Good    PT Frequency 2x / week   1x/week for first 3 weeks and then 2x/week   PT Duration 8 weeks    PT Treatment/Interventions ADLs/Self Care Home Management;Aquatic Therapy;Cryotherapy;Electrical Stimulation;Moist Heat;DME Instruction;Gait training;Stair training;Functional mobility training;Therapeutic activities;Therapeutic exercise;Balance training;Neuromuscular re-education;Patient/family education;Orthotic Fit/Training;Manual techniques;Scar mobilization;Passive range of motion;Dry needling;Energy conservation;Taping    PT Next Visit Plan Patient's surgeon reported WBAT in boot until monday 7/19 then transition to shoe. work on Aeronautical engineer, weightbearing tolerance and LE strength    PT Home Exercise Plan 01/02/20: Quad set, heel slide; 6/28 SLR, gastroc stretch; 7/15 weightbearing exercises.    Consulted and Agree with Plan of Care Patient           Patient will benefit from skilled therapeutic intervention in order to improve the following deficits and impairments:  Abnormal gait, Improper body mechanics, Pain, Decreased mobility, Decreased scar mobility, Decreased activity tolerance, Decreased endurance, Decreased range of motion, Decreased strength, Hypomobility, Decreased balance, Difficulty walking  Visit Diagnosis: Stiffness of right knee, not elsewhere classified  Stiffness of right ankle, not elsewhere classified  Pain in right ankle and joints of right foot  Other abnormalities of gait and mobility     Problem List Patient Active Problem List   Diagnosis Date  Noted  . Brain mass 12/20/2019  . Closed displaced spiral fracture of shaft of right tibia 12/13/2019  . Alcohol abuse with intoxication ([place]) 12/13/2019  . Closed fracture of right distal fibula 12/13/2019  . Displaced comminuted fracture of shaft of right tibia, initial encounter for closed fracture 12/12/2019  . H/O abdominal supracervical subtotal hysterectomy 06/27/2017  . Status post emergency cesarean hysterectomy 05/12/2017  . History of acute respiratory failure 05/08/2017  . History of postpartum hemorrhage 05/08/2017  . Trichimoniasis 01/18/2017   10:52 AM, 01/23/20 Jerene Pitch, DPT Physical Therapy with [facility]  [phone] office  [place] [address] [place], [place], [address] Phone: [phone]   Fax:  [phone]  Name: Maureen Key MRN: [id number] Date of Birth: 1981/04/23

## 2020-01-28 ENCOUNTER — Other Ambulatory Visit: Payer: Self-pay

## 2020-01-28 ENCOUNTER — Ambulatory Visit (HOSPITAL_COMMUNITY)
Admission: RE | Admit: 2020-01-28 | Discharge: 2020-01-28 | Disposition: A | Discharge status: Home / Self Care | Payer: 59 | Source: Ambulatory Visit | Attending: Neurological Surgery | Admitting: Neurological Surgery

## 2020-01-28 ENCOUNTER — Encounter (HOSPITAL_COMMUNITY): Payer: Self-pay | Admitting: Physical Therapy

## 2020-01-28 ENCOUNTER — Ambulatory Visit (HOSPITAL_COMMUNITY): Payer: 59 | Admitting: Physical Therapy

## 2020-01-28 DIAGNOSIS — M25661 Stiffness of right knee, not elsewhere classified: Secondary | ICD-10-CM | POA: Diagnosis not present

## 2020-01-28 DIAGNOSIS — R2689 Other abnormalities of gait and mobility: Secondary | ICD-10-CM | POA: Diagnosis not present

## 2020-01-28 DIAGNOSIS — M25571 Pain in right ankle and joints of right foot: Secondary | ICD-10-CM | POA: Diagnosis not present

## 2020-01-28 DIAGNOSIS — D496 Neoplasm of unspecified behavior of brain: Secondary | ICD-10-CM | POA: Diagnosis not present

## 2020-01-28 DIAGNOSIS — C719 Malignant neoplasm of brain, unspecified: Secondary | ICD-10-CM | POA: Diagnosis not present

## 2020-01-28 DIAGNOSIS — M25671 Stiffness of right ankle, not elsewhere classified: Secondary | ICD-10-CM | POA: Diagnosis not present

## 2020-01-28 MED ORDER — GADOBUTROL 1 MMOL/ML IV SOLN
5.5000 mL | Freq: Once | INTRAVENOUS | Status: AC | PRN
  Administered 2020-01-28: 5.5 mL via INTRAVENOUS

## 2020-01-28 NOTE — Therapy (Signed)
[place], [place], [address] Phone: [phone]   Fax:  [phone]  Physical Therapy Treatment  Patient Details  Name: Maureen Key MRN: [id number] Date of Birth: 24-Jan-1981 Referring Provider (PT): Antonieta Pert, MD   Encounter Date: 01/28/2020   PT End of Session - 01/28/20 0836    Visit Number 5    Number of Visits 15    Date for PT Re-Evaluation 02/27/20    Authorization Type [employer], patient does not have medicaid    Authorization Time Period [employer] 7/1-12/31/2021 Visit limit 30- 0 used combined PT/OT/ChiroNo Auth req with codes given AQ is covered    Authorization - Visit Number 3    Authorization - Number of Visits 30    Progress Note Due on Visit 10    PT Start Time [phone]    PT Stop Time 0856    PT Time Calculation (min) 40 min    Activity Tolerance Patient tolerated treatment well;Patient limited by pain    Behavior During Therapy [facility] for tasks assessed/performed           Past Medical History:  Diagnosis Date  . Blood transfusion without reported diagnosis    last [place] delivery  . Dysmenorrhea   . History of placenta accreta in prior pregnancy, currently pregnant   . History of postpartum hemorrhage, currently pregnant   . History of sepsis   . Pregnancy induced hypertension   . Sickle cell trait ([place])   . Trichimoniasis   . Vaginal Pap smear, abnormal     Past Surgical History:  Procedure Laterality Date  . ABDOMINAL HYSTERECTOMY  05/12/2017   Procedure: HYSTERECTOMY ABDOMINAL;  Surgeon: Jonnie Kind, MD;  Location: [place];  Service: Obstetrics;;  . CERVICAL CONE BIOPSY    . CESAREAN SECTION    . CESAREAN SECTION N/A 05/12/2017   Procedure: CESAREAN SECTION WITH Hysterectomy;  Surgeon: Jonnie Kind, MD;  Location: [place];  Service: Obstetrics;  Laterality: N/A;  . TIBIA IM NAIL INSERTION Right 12/13/2019   Procedure: INTRAMEDULLARY (IM) NAIL  TIBIAL;  Surgeon: Shona Needles, MD;  Location: [facility];  Service: Orthopedics;  Laterality: Right;    There were no vitals filed for this visit.   Subjective Assessment - 01/28/20 0818    Subjective Patient reported that she is having pain in her knee which she rates as a 6/10 and pain in her ankle which she rates as an 8/10.    Pertinent History S/P IM nail to repair closed displaced spiral fracture of shaft of right tibia    Limitations Standing;Walking;House hold activities    Diagnostic tests X-rays and brain MRI    Patient Stated Goals To get strength back in her leg    Currently in Pain? Yes    Pain Score 8     Pain Location Ankle    Pain Orientation Right    Pain Descriptors / Indicators Sore    Pain Type Acute pain;Surgical pain                             [facility] Adult PT Treatment/Exercise - 01/28/20 0001      Ambulation/Gait   Ambulation/Gait Yes    Ambulation Distance (Feet) 226 Feet    Assistive device None   Boot on RLE   Gait Pattern Decreased stance time - right;Decreased step length - left    Ambulation  Surface Level;Indoor    Gait Comments Cues to increase time on RLE      Knee/Hip Exercises: Standing   Other Standing Knee Exercises standing weight shifts - black foam x30 laterally. On solid ground forward/backward weight shift x 30 each LE forward.      Knee/Hip Exercises: Seated   Long Arc Quad 15 reps;Right;2 sets;AROM    Heel Slides AROM;Right;2 sets;15 reps    Other Seated Knee/Hip Exercises Ankle inversion and eversion x15 with towel                  PT Education - 01/28/20 0835    Education Details Reminded patient to not submerge her leg in a pool if her wound was recently reopened.    Person(s) Educated Patient    Methods Explanation    Comprehension Verbalized understanding            PT Short Term Goals - 01/02/20 1642      PT SHORT TERM GOAL #1   Title Patient will report understanding and regular compliance  with HEP to improve ROM, strength and overall functional mobility.    Time 4    Period Weeks    Status New    Target Date 01/30/20      PT SHORT TERM GOAL #2   Title Patient will demonstrate RT knee flexion of at least 100 degrees in order to improve ease of ascending and descending stairs once cleared by MD.    Time 4    Period Weeks    Status New    Target Date 01/30/20             PT Long Term Goals - 01/02/20 1645      PT LONG TERM GOAL #1   Title Patient will demonstrate RT knee flexion of at least 130 degrees in order to improve ease of ascending and descending stairs once cleared by MD.    Time 8    Period Weeks    Status New    Target Date 02/27/20      PT LONG TERM GOAL #2   Title Patient will demonstrate ability to ambulate without assistive device with gait speed of at least 1.0 m/s in order to return to work activities.    Time 8    Period Weeks    Status New    Target Date 02/27/20      PT LONG TERM GOAL #3   Title Patient will demonstrate WFL RT ankle ROM to improve gait mechanics and safety.    Time 8    Period Weeks    Status New    Target Date 02/27/20      PT LONG TERM GOAL #4   Title Patient will demonstrate improvement of at least 1/2 MMT strength grade in all deficient muscle groups in order to improve ease of navigating stairs.    Time 8    Period Weeks    Status New    Target Date 02/27/20      PT LONG TERM GOAL #5   Title Patient will report an overall improvment of at least 50% in subjective complaint in order for improved QoL.    Time 8    Period Weeks    Status New    Target Date 02/27/20                 Plan - 01/28/20 0856    Clinical Impression Statement Patient reported increased pain following yesterday when she  attempted to walk some with a shoe rather than the boot. Focused on gently increasing weightbearing this session. Patient still in the boot as she did not bring a shoe this visit. Focused on increasing stance  time on the right LE. Also focused on improving ankle and knee mobility. Plan to continue increasing weightbearing with shoe on next session.    Personal Factors and Comorbidities Comorbidity 1    Comorbidities Frontal lobe brain mass    Examination-Activity Limitations Bathing;Lift;Stand;Bed Mobility;Locomotion Level;Toileting;Bend;Transfers;Dressing;Squat;Stairs    Examination-Participation Restrictions Yard Work;Cleaning;Meal Prep;Community Activity;Driving;Laundry    Stability/Clinical Decision Making Evolving/Moderate complexity    Rehab Potential Good    PT Frequency 2x / week   1x/week for first 3 weeks and then 2x/week   PT Duration 8 weeks    PT Treatment/Interventions ADLs/Self Care Home Management;Aquatic Therapy;Cryotherapy;Electrical Stimulation;Moist Heat;DME Instruction;Gait training;Stair training;Functional mobility training;Therapeutic activities;Therapeutic exercise;Balance training;Neuromuscular re-education;Patient/family education;Orthotic Fit/Training;Manual techniques;Scar mobilization;Passive range of motion;Dry needling;Energy conservation;Taping    PT Next Visit Plan Patient's surgeon reported WBAT in boot until monday 7/19 then transition to shoe. work on Aeronautical engineer, weightbearing tolerance and LE strength    PT Home Exercise Plan 01/02/20: Quad set, heel slide; 6/28 SLR, gastroc stretch; 7/15 weightbearing exercises.    Consulted and Agree with Plan of Care Patient           Patient will benefit from skilled therapeutic intervention in order to improve the following deficits and impairments:  Abnormal gait, Improper body mechanics, Pain, Decreased mobility, Decreased scar mobility, Decreased activity tolerance, Decreased endurance, Decreased range of motion, Decreased strength, Hypomobility, Decreased balance, Difficulty walking  Visit Diagnosis: Stiffness of right knee, not elsewhere classified  Stiffness of right ankle, not elsewhere classified  Pain in  right ankle and joints of right foot  Other abnormalities of gait and mobility     Problem List Patient Active Problem List   Diagnosis Date Noted  . Brain mass 12/20/2019  . Closed displaced spiral fracture of shaft of right tibia 12/13/2019  . Alcohol abuse with intoxication ([place]) 12/13/2019  . Closed fracture of right distal fibula 12/13/2019  . Displaced comminuted fracture of shaft of right tibia, initial encounter for closed fracture 12/12/2019  . H/O abdominal supracervical subtotal hysterectomy 06/27/2017  . Status post emergency cesarean hysterectomy 05/12/2017  . History of acute respiratory failure 05/08/2017  . History of postpartum hemorrhage 05/08/2017  . Trichimoniasis 01/18/2017   Clarene Critchley PT, DPT 8:59 AM, 01/28/20 [place] [address] [place], [place], [address] Phone: [phone]   Fax:  [phone]  Name: DARYL QUIROS MRN: [id number] Date of Birth: 1981/04/28

## 2020-01-30 ENCOUNTER — Encounter (HOSPITAL_COMMUNITY): Payer: Self-pay | Admitting: Physical Therapy

## 2020-01-30 ENCOUNTER — Other Ambulatory Visit: Payer: Self-pay

## 2020-01-30 ENCOUNTER — Ambulatory Visit (HOSPITAL_COMMUNITY): Payer: 59 | Admitting: Physical Therapy

## 2020-01-30 DIAGNOSIS — M25671 Stiffness of right ankle, not elsewhere classified: Secondary | ICD-10-CM

## 2020-01-30 DIAGNOSIS — R2689 Other abnormalities of gait and mobility: Secondary | ICD-10-CM

## 2020-01-30 DIAGNOSIS — M25571 Pain in right ankle and joints of right foot: Secondary | ICD-10-CM

## 2020-01-30 DIAGNOSIS — M25661 Stiffness of right knee, not elsewhere classified: Secondary | ICD-10-CM

## 2020-01-30 NOTE — Therapy (Signed)
[place], [place], [address] Phone: [phone]   Fax:  [phone]  Physical Therapy Treatment  Patient Details  Name: Maureen Key MRN: [id number] Date of Birth: 1980/12/12 Referring Provider (PT): Antonieta Pert, MD   Encounter Date: 01/30/2020   PT End of Session - 01/30/20 1036    Visit Number 6    Number of Visits 15    Date for PT Re-Evaluation 02/27/20    Authorization Type [employer], patient does not have medicaid    Authorization Time Period [employer] 7/1-12/31/2021 Visit limit 30- 0 used combined PT/OT/ChiroNo Auth req with codes given AQ is covered    Authorization - Visit Number 4    Authorization - Number of Visits 30    Progress Note Due on Visit 10    PT Start Time 1035    PT Stop Time 1113    PT Time Calculation (min) 38 min    Activity Tolerance Patient tolerated treatment well;Patient limited by pain    Behavior During Therapy [facility] for tasks assessed/performed           Past Medical History:  Diagnosis Date  . Blood transfusion without reported diagnosis    last [place] delivery  . Dysmenorrhea   . History of placenta accreta in prior pregnancy, currently pregnant   . History of postpartum hemorrhage, currently pregnant   . History of sepsis   . Pregnancy induced hypertension   . Sickle cell trait ([place])   . Trichimoniasis   . Vaginal Pap smear, abnormal     Past Surgical History:  Procedure Laterality Date  . ABDOMINAL HYSTERECTOMY  05/12/2017   Procedure: HYSTERECTOMY ABDOMINAL;  Surgeon: Jonnie Kind, MD;  Location: [place];  Service: Obstetrics;;  . CERVICAL CONE BIOPSY    . CESAREAN SECTION    . CESAREAN SECTION N/A 05/12/2017   Procedure: CESAREAN SECTION WITH Hysterectomy;  Surgeon: Jonnie Kind, MD;  Location: [place];  Service: Obstetrics;  Laterality: N/A;  . TIBIA IM NAIL INSERTION Right 12/13/2019   Procedure: INTRAMEDULLARY (IM) NAIL  TIBIAL;  Surgeon: Shona Needles, MD;  Location: [place];  Service: Orthopedics;  Laterality: Right;    There were no vitals filed for this visit.   Subjective Assessment - 01/30/20 1035    Subjective Patient reported 4/10 ankle pain.    Pertinent History S/P IM nail to repair closed displaced spiral fracture of shaft of right tibia    Limitations Standing;Walking;House hold activities    Diagnostic tests X-rays and brain MRI    Patient Stated Goals To get strength back in her leg    Currently in Pain? Yes    Pain Score 4     Pain Location Ankle    Pain Orientation Right    Pain Descriptors / Indicators Sore    Pain Type Surgical pain                             [facility] Adult PT Treatment/Exercise - 01/30/20 0001      Ambulation/Gait   Ambulation/Gait Yes    Ambulation Distance (Feet) --   8' x 6 in // bars   Gait Comments Cues to increase stance time on RT LE      Knee/Hip Exercises: Stretches   Gastroc Stretch 3 reps;30 seconds    Gastroc Stretch Limitations Slant board      Knee/Hip Exercises: Standing  Heel Raises Both;1 set;10 reps    Heel Raises Limitations with bil HHA    Rocker Board 1 minute   x2 laterally and AP   Other Standing Knee Exercises standing weight shifts with sneaker on -  x30 laterally. On solid ground forward/backward weight shift x 30 each LE forward.      Knee/Hip Exercises: Seated   Long Arc Quad 15 reps;Right;2 sets;AROM                    PT Short Term Goals - 01/02/20 1642      PT SHORT TERM GOAL #1   Title Patient will report understanding and regular compliance with HEP to improve ROM, strength and overall functional mobility.    Time 4    Period Weeks    Status New    Target Date 01/30/20      PT SHORT TERM GOAL #2   Title Patient will demonstrate RT knee flexion of at least 100 degrees in order to improve ease of ascending and descending stairs once cleared by MD.    Time 4    Period Weeks    Status New     Target Date 01/30/20             PT Long Term Goals - 01/02/20 1645      PT LONG TERM GOAL #1   Title Patient will demonstrate RT knee flexion of at least 130 degrees in order to improve ease of ascending and descending stairs once cleared by MD.    Time 8    Period Weeks    Status New    Target Date 02/27/20      PT LONG TERM GOAL #2   Title Patient will demonstrate ability to ambulate without assistive device with gait speed of at least 1.0 m/s in order to return to work activities.    Time 8    Period Weeks    Status New    Target Date 02/27/20      PT LONG TERM GOAL #3   Title Patient will demonstrate WFL RT ankle ROM to improve gait mechanics and safety.    Time 8    Period Weeks    Status New    Target Date 02/27/20      PT LONG TERM GOAL #4   Title Patient will demonstrate improvement of at least 1/2 MMT strength grade in all deficient muscle groups in order to improve ease of navigating stairs.    Time 8    Period Weeks    Status New    Target Date 02/27/20      PT LONG TERM GOAL #5   Title Patient will report an overall improvment of at least 50% in subjective complaint in order for improved QoL.    Time 8    Period Weeks    Status New    Target Date 02/27/20                 Plan - 01/30/20 1259    Clinical Impression Statement Focused this session on increasing patient's weightbearing through the right lower extremity with a shoe on. Patient continued to demonstrate decreased stance time on the RT side, so had patient ambulate inside of the parallel bars to help take some of the force off of her right lower extremity to improve mechanics. Following several laps in the parallel bars like this, patient did demonstrate some improved stance time on the right lower extremity. Educated  patient on increasing the wear-time in her shoe. Patient is scheduled to have surgery next week, so patient's next appointment will be her last until after the surgery.     Personal Factors and Comorbidities Comorbidity 1    Comorbidities Frontal lobe brain mass    Examination-Activity Limitations Bathing;Lift;Stand;Bed Mobility;Locomotion Level;Toileting;Bend;Transfers;Dressing;Squat;Stairs    Examination-Participation Restrictions Yard Work;Cleaning;Meal Prep;Community Activity;Driving;Laundry    Stability/Clinical Decision Making Evolving/Moderate complexity    Rehab Potential Good    PT Frequency 2x / week   1x/week for first 3 weeks and then 2x/week   PT Duration 8 weeks    PT Treatment/Interventions ADLs/Self Care Home Management;Aquatic Therapy;Cryotherapy;Electrical Stimulation;Moist Heat;DME Instruction;Gait training;Stair training;Functional mobility training;Therapeutic activities;Therapeutic exercise;Balance training;Neuromuscular re-education;Patient/family education;Orthotic Fit/Training;Manual techniques;Scar mobilization;Passive range of motion;Dry needling;Energy conservation;Taping    PT Next Visit Plan Discuss plan and re-assess as needed as next appointment will be the patient's last until after surgery. Consider adding power tower for decreased gravity exercises. Continue progressing more equal weightbearing through the right lower extremity in shoe. work on Aeronautical engineer, weightbearing tolerance and LE strength    PT Home Exercise Plan 01/02/20: Quad set, heel slide; 6/28 SLR, gastroc stretch; 7/15 weightbearing exercises. 01/30/20: Increasing ambulation in shoe    Consulted and Agree with Plan of Care Patient           Patient will benefit from skilled therapeutic intervention in order to improve the following deficits and impairments:  Abnormal gait, Improper body mechanics, Pain, Decreased mobility, Decreased scar mobility, Decreased activity tolerance, Decreased endurance, Decreased range of motion, Decreased strength, Hypomobility, Decreased balance, Difficulty walking  Visit Diagnosis: Stiffness of right knee, not elsewhere  classified  Stiffness of right ankle, not elsewhere classified  Pain in right ankle and joints of right foot  Other abnormalities of gait and mobility     Problem List Patient Active Problem List   Diagnosis Date Noted  . Brain mass 12/20/2019  . Closed displaced spiral fracture of shaft of right tibia 12/13/2019  . Alcohol abuse with intoxication ([place]) 12/13/2019  . Closed fracture of right distal fibula 12/13/2019  . Displaced comminuted fracture of shaft of right tibia, initial encounter for closed fracture 12/12/2019  . H/O abdominal supracervical subtotal hysterectomy 06/27/2017  . Status post emergency cesarean hysterectomy 05/12/2017  . History of acute respiratory failure 05/08/2017  . History of postpartum hemorrhage 05/08/2017  . Trichimoniasis 01/18/2017   Clarene Critchley PT, DPT 1:03 PM, 01/30/20 [place] [address] [place], [place], [address] Phone: [phone]   Fax:  [phone]  Name: Maureen Key MRN: [id number] Date of Birth: [birth year]

## 2020-01-30 NOTE — Progress Notes (Signed)
Your procedure is scheduled on Tuesday July 27.  Report to [facility] Main Entrance "A" at 05:30 A.M., and check in at the Admitting office.  Call this number if you have problems the morning of surgery: [phone]  Call [phone] if you have any questions prior to your surgery date Monday-Friday 8am-4pm   Remember: Do not eat or drink after midnight the night before your surgery   Take these medicines the morning of surgery with A SIP OF WATER: none  If needed:  acetaminophen (TYLENOL)    As of today, STOP taking any Aspirin (unless otherwise instructed by your surgeon), Aleve, Naproxen, Ibuprofen, Motrin, Advil, Goody's, BC's, all herbal medications, fish oil, and all vitamins.    The Morning of Surgery  Do not wear jewelry, make-up or nail polish.  Do not wear lotions, powders, or perfumes, or deodorant  Do not shave 48 hours prior to surgery.    Do not bring valuables to the hospital.  [facility] is not responsible for any belongings or valuables.  If you are a smoker, DO NOT Smoke 24 hours prior to surgery  If you wear a CPAP at night please bring your mask the morning of surgery   Remember that you must have someone to transport you home after your surgery, and remain with you for 24 hours if you are discharged the same day.   Please bring cases for contacts, glasses, hearing aids, dentures or bridgework because it cannot be worn into surgery.    Leave your suitcase in the car.  After surgery it may be brought to your room.  For patients admitted to the hospital, discharge time will be determined by your treatment team.  Patients discharged the day of surgery will not be allowed to drive home.    Special instructions:   [facility]- Preparing For Surgery  Before surgery, you can play an important role. Because skin is not sterile, your skin needs to be as free of germs as possible. You can reduce the number of germs on your skin by washing with CHG  (chlorahexidine gluconate) Soap before surgery.  CHG is an antiseptic cleaner which kills germs and bonds with the skin to continue killing germs even after washing.    Oral Hygiene is also important to reduce your risk of infection.  Remember - BRUSH YOUR TEETH THE MORNING OF SURGERY WITH YOUR REGULAR TOOTHPASTE  Please do not use if you have an allergy to CHG or antibacterial soaps. If your skin becomes reddened/irritated stop using the CHG.  Do not shave (including legs and underarms) for at least 48 hours prior to first CHG shower. It is OK to shave your face.  Please follow these instructions carefully.   1. Shower the NIGHT BEFORE SURGERY and the MORNING OF SURGERY with CHG Soap.   2. If you chose to wash your hair and body, wash as usual with your normal shampoo and body-wash/soap.  3. Rinse your hair and body thoroughly to remove the shampoo and soap.  4. Apply CHG directly to the skin (ONLY FROM THE NECK DOWN) and wash gently with a scrungie or a clean washcloth.   5. Do not use on open wounds or open sores. Avoid contact with your eyes, ears, mouth and genitals (private parts). Wash Face and genitals (private parts)  with your normal soap.   6. Wash thoroughly, paying special attention to the area where your surgery will be performed.  7. Thoroughly rinse your body with warm  water from the neck down.  8. DO NOT shower/wash with your normal soap after using and rinsing off the CHG Soap.  9. Pat yourself dry with a CLEAN TOWEL.  10. Wear CLEAN PAJAMAS to bed the night before surgery  11. Place CLEAN SHEETS on your bed the night of your first shower and DO NOT SLEEP WITH PETS.  12. Wear comfortable clothes the morning of surgery.     Day of Surgery:  Please shower the morning of surgery with the CHG soap Do not apply any deodorants/lotions. Please wear clean clothes to the hospital/surgery center.   Remember to brush your teeth WITH YOUR REGULAR TOOTHPASTE.   Please  read over the following fact sheets that you were given.

## 2020-01-31 ENCOUNTER — Encounter (HOSPITAL_COMMUNITY)
Admission: RE | Admit: 2020-01-31 | Discharge: 2020-01-31 | Disposition: A | Discharge status: Home / Self Care | Payer: 59 | Source: Ambulatory Visit | Attending: Neurological Surgery | Admitting: Neurological Surgery

## 2020-01-31 ENCOUNTER — Encounter (HOSPITAL_COMMUNITY): Payer: Self-pay

## 2020-01-31 ENCOUNTER — Other Ambulatory Visit (HOSPITAL_COMMUNITY)
Admission: RE | Admit: 2020-01-31 | Discharge: 2020-01-31 | Disposition: A | Discharge status: Home / Self Care | Payer: 59 | Source: Ambulatory Visit | Attending: Neurological Surgery | Admitting: Neurological Surgery

## 2020-01-31 ENCOUNTER — Other Ambulatory Visit: Payer: Self-pay

## 2020-01-31 DIAGNOSIS — Z01812 Encounter for preprocedural laboratory examination: Secondary | ICD-10-CM | POA: Insufficient documentation

## 2020-01-31 DIAGNOSIS — Z20822 Contact with and (suspected) exposure to covid-19: Secondary | ICD-10-CM | POA: Insufficient documentation

## 2020-01-31 LAB — SARS CORONAVIRUS 2 (TAT 6-24 HRS): SARS Coronavirus 2: NEGATIVE

## 2020-01-31 LAB — BASIC METABOLIC PANEL
Anion gap: 8 (ref 5–15)
BUN: 9 mg/dL (ref 6–20)
CO2: 27 mmol/L (ref 22–32)
Calcium: 9.6 mg/dL (ref 8.9–10.3)
Chloride: 101 mmol/L (ref 98–111)
Creatinine, Ser: 0.67 mg/dL (ref 0.44–1.00)
GFR calc Af Amer: >60 mL/min (ref >60)
GFR calc non Af Amer: >60 mL/min (ref >60)
Glucose, Bld: 82 mg/dL (ref 70–99)
Potassium: 3.6 mmol/L (ref 3.5–5.1)
Sodium: 136 mmol/L (ref 135–145)

## 2020-01-31 LAB — CBC
HCT: 37.2 % (ref 36.0–46.0)
Hemoglobin: 12.6 g/dL (ref 12.0–15.0)
MCH: 32.8 pg (ref 26.0–34.0)
MCHC: 33.9 g/dL (ref 30.0–36.0)
MCV: 96.9 fL (ref 80.0–100.0)
Platelets: 262 10*3/uL (ref 150–400)
RBC: 3.84 MIL/uL — ABNORMAL LOW (ref 3.87–5.11)
RDW: 11.7 % (ref 11.5–15.5)
WBC: 5.2 10*3/uL (ref 4.0–10.5)
nRBC: 0 % (ref 0.0–0.2)

## 2020-01-31 LAB — TYPE AND SCREEN
ABO/RH(D): AB POS
Antibody Screen: NEGATIVE

## 2020-01-31 NOTE — Progress Notes (Signed)
PCP - Mallory Shirk @ [facility] in [place] Cardiologist - na  PPM/ICD - na   Chest x-ray - na EKG - na Stress Test -  na ECHO - na Cardiac Cath - na  Sleep Study - na   Fasting Blood Sugar - na   Blood Thinner Instructions:na Aspirin Instructions: na  ERAS Protcol -na   COVID TEST- 01/31/20   Anesthesia review:   Patient denies shortness of breath, fever, cough and chest pain at PAT appointment   All instructions explained to the patient, with a verbal understanding of the material. Patient agrees to go over the instructions while at home for a better understanding. Patient also instructed to self quarantine after being tested for COVID-19. The opportunity to ask questions was provided.

## 2020-02-03 ENCOUNTER — Encounter (HOSPITAL_COMMUNITY): Payer: Self-pay | Admitting: Physical Therapy

## 2020-02-03 ENCOUNTER — Other Ambulatory Visit: Payer: Self-pay

## 2020-02-03 ENCOUNTER — Ambulatory Visit (HOSPITAL_COMMUNITY): Payer: 59 | Admitting: Physical Therapy

## 2020-02-03 DIAGNOSIS — M25571 Pain in right ankle and joints of right foot: Secondary | ICD-10-CM

## 2020-02-03 DIAGNOSIS — R2689 Other abnormalities of gait and mobility: Secondary | ICD-10-CM

## 2020-02-03 DIAGNOSIS — M25661 Stiffness of right knee, not elsewhere classified: Secondary | ICD-10-CM

## 2020-02-03 DIAGNOSIS — M25671 Stiffness of right ankle, not elsewhere classified: Secondary | ICD-10-CM

## 2020-02-03 NOTE — Anesthesia Preprocedure Evaluation (Addendum)
Anesthesia Evaluation  Patient identified by MRN, date of birth, ID band Patient awake    Reviewed: Allergy & Precautions, NPO status , Patient's Chart, lab work & pertinent test results  Airway Mallampati: II  TM Distance: >3 FB Neck ROM: Full    Dental  (+) Teeth Intact, Dental Advisory Given   Pulmonary Current Smoker and Patient abstained from smoking.,    Pulmonary exam normal breath sounds clear to auscultation       Cardiovascular hypertension, Normal cardiovascular exam Rhythm:Regular Rate:Normal     Neuro/Psych BRAIN TUMOR    GI/Hepatic negative GI ROS, (+)     substance abuse  alcohol use,   Endo/Other  negative endocrine ROS  Renal/GU negative Renal ROS     Musculoskeletal negative musculoskeletal ROS (+)   Abdominal   Peds  Hematology  (+) Blood dyscrasia, Sickle cell trait ,   Anesthesia Other Findings   Reproductive/Obstetrics                            Anesthesia Physical Anesthesia Plan  ASA: IV  Anesthesia Plan: General   Post-op Pain Management:    Induction: Intravenous  PONV Risk Score and Plan: 2 and Midazolam, Dexamethasone and Ondansetron  Airway Management Planned: Oral ETT  Additional Equipment: Arterial line, CVP and Ultrasound Guidance Line Placement  Intra-op Plan:   Post-operative Plan: Possible Post-op intubation/ventilation  Informed Consent: I have reviewed the patients History and Physical, chart, labs and discussed the procedure including the risks, benefits and alternatives for the proposed anesthesia with the patient or authorized representative who has indicated his/her understanding and acceptance.     Dental advisory given  Plan Discussed with: CRNA  Anesthesia Plan Comments:        Anesthesia Quick Evaluation

## 2020-02-03 NOTE — Therapy (Signed)
[facility] [address] [place], [place], [address] Phone: [phone]   Fax:  [phone]  Physical Therapy Treatment and Discharge Note  Patient Details  Name: Maureen Key MRN: [id number] Date of Birth: 12/04/1980 Referring Provider (PT): Antonieta Pert, MD  PHYSICAL THERAPY DISCHARGE SUMMARY  Visits from Start of Care:7   Current functional level related to goals / functional outcomes: See below   Remaining deficits: See below   Education / Equipment: See below  Plan: Patient agrees to discharge.  Patient goals were partially met. Patient is being discharged due to                                                     ?????  Patient to have brain surgery tomorrow.      Encounter Date: 02/03/2020   PT End of Session - 02/03/20 1124    Visit Number 7    Number of Visits 15    Date for PT Re-Evaluation 02/27/20    Authorization Type [employer], patient does not have medicaid    Authorization Time Period [employer] 7/1-12/31/2021 Visit limit 30- 0 used combined PT/OT/ChiroNo Auth req with codes given AQ is covered    Authorization - Visit Number 5    Authorization - Number of Visits 30    Progress Note Due on Visit 10    PT Start Time 1045    PT Stop Time 1125    PT Time Calculation (min) 40 min    Activity Tolerance Patient tolerated treatment well;Patient limited by pain    Behavior During Therapy WFL for tasks assessed/performed           Past Medical History:  Diagnosis Date  . Blood transfusion without reported diagnosis    last [place] delivery  . Dysmenorrhea   . History of placenta accreta in prior pregnancy, currently pregnant   . History of postpartum hemorrhage, currently pregnant   . History of sepsis   . Pregnancy induced hypertension   . Sickle cell trait ([place])   . Trichimoniasis   . Vaginal Pap smear, abnormal     Past Surgical History:  Procedure Laterality Date  . ABDOMINAL HYSTERECTOMY   05/12/2017   Procedure: HYSTERECTOMY ABDOMINAL;  Surgeon: Jonnie Kind, MD;  Location: [facility];  Service: Obstetrics;;  . CERVICAL CONE BIOPSY    . CESAREAN SECTION    . CESAREAN SECTION N/A 05/12/2017   Procedure: CESAREAN SECTION WITH Hysterectomy;  Surgeon: Jonnie Kind, MD;  Location: [facility];  Service: Obstetrics;  Laterality: N/A;  . TIBIA IM NAIL INSERTION Right 12/13/2019   Procedure: INTRAMEDULLARY (IM) NAIL TIBIAL;  Surgeon: Shona Needles, MD;  Location: [facility];  Service: Orthopedics;  Laterality: Right;    There were no vitals filed for this visit.   Subjective Assessment - 02/03/20 1246    Subjective Patient reports overall she feels 95% better. She feels confident in her exercises.    Pertinent History S/P IM nail to repair closed displaced spiral fracture of shaft of right tibia    Limitations Standing;Walking;House hold activities    Diagnostic tests X-rays and brain MRI    Patient Stated Goals To get strength back in her leg    Currently in Pain? No/denies  [facility] PT Assessment - 02/03/20 0001      Assessment   Medical Diagnosis Closed displaced spiral fracture of shaft of right tibia    Referring Provider (PT) Antonieta Pert, MD    Next MD Visit 03/03/20      ROM / Strength   AROM / PROM / Strength PROM      PROM   PROM Assessment Site Ankle    Right/Left Ankle Right;Left    Right Ankle Dorsiflexion 2    Right Ankle Plantar Flexion 50    Right Ankle Inversion 5    Right Ankle Eversion 20    Left Ankle Dorsiflexion 8    Left Ankle Plantar Flexion 65    Left Ankle Inversion 5    Left Ankle Eversion 40      Strength   Right Hip Flexion 4-/5    Left Hip Flexion 5/5    Right Knee Flexion 3+/5    Right Knee Extension 3+/5    Left Knee Flexion 5/5    Left Knee Extension 5/5    Right Ankle Dorsiflexion 3-/5    Right Ankle Plantar Flexion 3-/5    Left Ankle Dorsiflexion 5/5      Ambulation/Gait   Ambulation/Gait Yes     Ambulation Distance (Feet) 406 Feet    Assistive device None    Gait Comments in walking boot no pain       Balance   Balance Assessed Yes      Static Standing Balance   Static Standing - Balance Support Bilateral upper extremity supported    Static Standing - Comment/# of Minutes SLS L 30", R 22" - minor tingling in right                          [facility] Adult PT Treatment/Exercise - 02/03/20 0001      Knee/Hip Exercises: Seated   Other Seated Knee/Hip Exercises PROM of ankle/foot PPF/DF/INV/EVE                  PT Education - 02/03/20 1246    Education Details Educated patient in current progress, continued HEP, transitioning out of boot as tolerated. answered all questions prior to end of session    Person(s) Educated Patient    Methods Explanation    Comprehension Verbalized understanding            PT Short Term Goals - 02/03/20 1048      PT SHORT TERM GOAL #1   Title Patient will report understanding and regular compliance with HEP to improve ROM, strength and overall functional mobility.    Time 4    Period Weeks    Status Achieved    Target Date 01/30/20      PT SHORT TERM GOAL #2   Title Patient will demonstrate RT knee flexion of at least 100 degrees in order to improve ease of ascending and descending stairs once cleared by MD.    Time 4    Period Weeks    Status Achieved    Target Date 01/30/20             PT Long Term Goals - 02/03/20 1056      PT LONG TERM GOAL #1   Title Patient will demonstrate RT knee flexion of at least 130 degrees in order to improve ease of ascending and descending stairs once cleared by MD.    Baseline 150    Time 8  Period Weeks    Status Achieved      PT LONG TERM GOAL #2   Title Patient will demonstrate ability to ambulate without assistive device with gait speed of at least 1.0 m/s in order to return to work activities.    Time 8    Period Weeks    Status Achieved      PT LONG TERM  GOAL #3   Title Patient will demonstrate WFL RT ankle ROM to improve gait mechanics and safety.    Time 8    Period Weeks    Status On-going      PT LONG TERM GOAL #4   Title Patient will demonstrate improvement of at least 1/2 MMT strength grade in all deficient muscle groups in order to improve ease of navigating stairs.    Time 8    Period Weeks    Status On-going      PT LONG TERM GOAL #5   Title Patient will report an overall improvment of at least 50% in subjective complaint in order for improved QoL.    Baseline 95% better    Time 8    Period Weeks    Status Achieved                 Plan - 02/03/20 1250    Clinical Impression Statement Patient has met all short term goals and 3/5 long term goals at this time. She is making great progress and weaning form her boot as instructed by MD. Patient to have brain surgery tomorrow. Reviewed HEP and answered all questions on this date. Patient to discharge from PT to HEP secondary to upcoming surgery. Anticipate patient to return to therapy for ankle/foot once recovered from brain surgery.    Personal Factors and Comorbidities Comorbidity 1    Comorbidities Frontal lobe brain mass    Examination-Activity Limitations Bathing;Lift;Stand;Bed Mobility;Locomotion Level;Toileting;Bend;Transfers;Dressing;Squat;Stairs    Examination-Participation Restrictions Yard Work;Cleaning;Meal Prep;Community Activity;Driving;Laundry    Stability/Clinical Decision Making Evolving/Moderate complexity    Rehab Potential Good    PT Frequency 2x / week   1x/week for first 3 weeks and then 2x/week   PT Duration 8 weeks    PT Treatment/Interventions ADLs/Self Care Home Management;Aquatic Therapy;Cryotherapy;Electrical Stimulation;Moist Heat;DME Instruction;Gait training;Stair training;Functional mobility training;Therapeutic activities;Therapeutic exercise;Balance training;Neuromuscular re-education;Patient/family education;Orthotic Fit/Training;Manual  techniques;Scar mobilization;Passive range of motion;Dry needling;Energy conservation;Taping    PT Next Visit Plan pt to DC to HEP secondary to upcoming surgery    PT Home Exercise Plan 01/02/20: Quad set, heel slide; 6/28 SLR, gastroc stretch; 7/15 weightbearing exercises. 01/30/20: Increasing ambulation in shoe; 7/26 ankle PROM    Consulted and Agree with Plan of Care Patient           Patient will benefit from skilled therapeutic intervention in order to improve the following deficits and impairments:  Abnormal gait, Improper body mechanics, Pain, Decreased mobility, Decreased scar mobility, Decreased activity tolerance, Decreased endurance, Decreased range of motion, Decreased strength, Hypomobility, Decreased balance, Difficulty walking  Visit Diagnosis: Stiffness of right knee, not elsewhere classified  Stiffness of right ankle, not elsewhere classified  Pain in right ankle and joints of right foot  Other abnormalities of gait and mobility     Problem List Patient Active Problem List   Diagnosis Date Noted  . Brain mass 12/20/2019  . Closed displaced spiral fracture of shaft of right tibia 12/13/2019  . Alcohol abuse with intoxication ([place]) 12/13/2019  . Closed fracture of right distal fibula 12/13/2019  . Displaced comminuted fracture of  shaft of right tibia, initial encounter for closed fracture 12/12/2019  . H/O abdominal supracervical subtotal hysterectomy 06/27/2017  . Status post emergency cesarean hysterectomy 05/12/2017  . History of acute respiratory failure 05/08/2017  . History of postpartum hemorrhage 05/08/2017  . Trichimoniasis 01/18/2017   12:52 PM, 02/03/20 Jerene Pitch, DPT Physical Therapy with [facility]  [phone] office  [facility] [place], [place], [address] Phone: [phone]   Fax:  [phone]  Name: Maureen Key MRN: [id number] Date of Birth:  1980-08-08

## 2020-02-04 ENCOUNTER — Inpatient Hospital Stay (HOSPITAL_COMMUNITY): Payer: 59 | Admitting: Certified Registered Nurse Anesthetist

## 2020-02-04 ENCOUNTER — Other Ambulatory Visit: Payer: Self-pay

## 2020-02-04 ENCOUNTER — Inpatient Hospital Stay (HOSPITAL_COMMUNITY)
Admission: RE | Admit: 2020-02-04 | Discharge: 2020-02-07 | DRG: 027 | Disposition: A | Discharge status: Home / Self Care | Payer: 59 | Attending: Neurological Surgery | Admitting: Neurological Surgery

## 2020-02-04 ENCOUNTER — Encounter (HOSPITAL_COMMUNITY): Payer: Self-pay | Admitting: Neurological Surgery

## 2020-02-04 ENCOUNTER — Encounter (HOSPITAL_COMMUNITY)
Admission: RE | Disposition: A | Discharge status: Home / Self Care | Payer: Self-pay | Source: Home / Self Care | Attending: Neurological Surgery

## 2020-02-04 DIAGNOSIS — C711 Malignant neoplasm of frontal lobe: Secondary | ICD-10-CM | POA: Diagnosis not present

## 2020-02-04 DIAGNOSIS — Z820 Family history of epilepsy and other diseases of the nervous system: Secondary | ICD-10-CM | POA: Diagnosis not present

## 2020-02-04 DIAGNOSIS — D573 Sickle-cell trait: Secondary | ICD-10-CM | POA: Diagnosis present

## 2020-02-04 DIAGNOSIS — C719 Malignant neoplasm of brain, unspecified: Secondary | ICD-10-CM | POA: Diagnosis not present

## 2020-02-04 DIAGNOSIS — F17210 Nicotine dependence, cigarettes, uncomplicated: Secondary | ICD-10-CM | POA: Diagnosis present

## 2020-02-04 DIAGNOSIS — J3489 Other specified disorders of nose and nasal sinuses: Secondary | ICD-10-CM | POA: Diagnosis not present

## 2020-02-04 DIAGNOSIS — Z8249 Family history of ischemic heart disease and other diseases of the circulatory system: Secondary | ICD-10-CM

## 2020-02-04 DIAGNOSIS — Z833 Family history of diabetes mellitus: Secondary | ICD-10-CM

## 2020-02-04 DIAGNOSIS — G9389 Other specified disorders of brain: Secondary | ICD-10-CM | POA: Diagnosis not present

## 2020-02-04 DIAGNOSIS — D496 Neoplasm of unspecified behavior of brain: Secondary | ICD-10-CM | POA: Diagnosis present

## 2020-02-04 HISTORY — PX: APPLICATION OF CRANIAL NAVIGATION: SHX6578

## 2020-02-04 HISTORY — PX: CRANIOTOMY: SHX93

## 2020-02-04 LAB — SURGICAL PCR SCREEN
MRSA, PCR: NEGATIVE
Staphylococcus aureus: NEGATIVE

## 2020-02-04 LAB — MRSA PCR SCREENING: MRSA by PCR: NEGATIVE

## 2020-02-04 SURGERY — CRANIOTOMY TUMOR EXCISION
Anesthesia: General | Site: Head

## 2020-02-04 MED ORDER — BACITRACIN ZINC 500 UNIT/GM EX OINT
TOPICAL_OINTMENT | CUTANEOUS | Status: DC | PRN
  Administered 2020-02-04: 1 via TOPICAL

## 2020-02-04 MED ORDER — CHLORHEXIDINE GLUCONATE 0.12 % MT SOLN
15.0000 mL | Freq: Once | OROMUCOSAL | Status: AC
  Administered 2020-02-04: 15 mL via OROMUCOSAL
  Filled 2020-02-04: qty 15

## 2020-02-04 MED ORDER — FENTANYL CITRATE (PF) 100 MCG/2ML IJ SOLN
25.0000 ug | INTRAMUSCULAR | Status: DC | PRN
  Administered 2020-02-04: 25 ug via INTRAVENOUS

## 2020-02-04 MED ORDER — PROMETHAZINE HCL 25 MG/ML IJ SOLN: 6.2500 mg | INTRAMUSCULAR | Status: DC | PRN

## 2020-02-04 MED ORDER — PROPOFOL 10 MG/ML IV BOLUS
INTRAVENOUS | Status: DC | PRN
  Administered 2020-02-04: 30 mg via INTRAVENOUS
  Administered 2020-02-04: 100 mg via INTRAVENOUS

## 2020-02-04 MED ORDER — HYDROCODONE-ACETAMINOPHEN 5-325 MG PO TABS
1.0000 | ORAL_TABLET | ORAL | Status: DC | PRN
  Administered 2020-02-04 – 2020-02-06 (×5): 1 via ORAL
  Filled 2020-02-04 (×6): qty 1

## 2020-02-04 MED ORDER — SODIUM CHLORIDE 0.9 % IV SOLN
INTRAVENOUS | Status: DC | PRN
  Administered 2020-02-04: 500 mL

## 2020-02-04 MED ORDER — PROMETHAZINE HCL 25 MG PO TABS
12.5000 mg | ORAL_TABLET | ORAL | Status: DC | PRN
  Administered 2020-02-04: 25 mg via ORAL
  Filled 2020-02-04: qty 1

## 2020-02-04 MED ORDER — CHLORHEXIDINE GLUCONATE CLOTH 2 % EX PADS: 6.0000 | MEDICATED_PAD | Freq: Once | CUTANEOUS | Status: DC

## 2020-02-04 MED ORDER — PROPOFOL 10 MG/ML IV BOLUS
INTRAVENOUS | Status: AC
  Filled 2020-02-04: qty 20

## 2020-02-04 MED ORDER — THROMBIN 20000 UNITS EX SOLR
CUTANEOUS | Status: DC | PRN
  Administered 2020-02-04: 20 mL

## 2020-02-04 MED ORDER — LIDOCAINE 2% (20 MG/ML) 5 ML SYRINGE
INTRAMUSCULAR | Status: DC | PRN
  Administered 2020-02-04: 60 mg via INTRAVENOUS

## 2020-02-04 MED ORDER — ORAL CARE MOUTH RINSE: 15.0000 mL | Freq: Once | OROMUCOSAL | Status: AC

## 2020-02-04 MED ORDER — POLYETHYLENE GLYCOL 3350 17 G PO PACK: 17.0000 g | PACK | Freq: Every day | ORAL | Status: DC | PRN

## 2020-02-04 MED ORDER — ROCURONIUM BROMIDE 10 MG/ML (PF) SYRINGE
PREFILLED_SYRINGE | INTRAVENOUS | Status: DC | PRN
  Administered 2020-02-04: 30 mg via INTRAVENOUS
  Administered 2020-02-04: 50 mg via INTRAVENOUS

## 2020-02-04 MED ORDER — FAMOTIDINE IN NACL 20-0.9 MG/50ML-% IV SOLN
20.0000 mg | Freq: Two times a day (BID) | INTRAVENOUS | Status: DC
  Administered 2020-02-04 – 2020-02-05 (×2): 20 mg via INTRAVENOUS
  Filled 2020-02-04 (×2): qty 50

## 2020-02-04 MED ORDER — ACETAMINOPHEN 500 MG PO TABS
1000.0000 mg | ORAL_TABLET | Freq: Once | ORAL | Status: AC
  Administered 2020-02-04: 1000 mg via ORAL
  Filled 2020-02-04: qty 2

## 2020-02-04 MED ORDER — ESMOLOL HCL 100 MG/10ML IV SOLN
INTRAVENOUS | Status: DC | PRN
  Administered 2020-02-04 (×2): 20 mg via INTRAVENOUS

## 2020-02-04 MED ORDER — MIDAZOLAM HCL 2 MG/2ML IJ SOLN
INTRAMUSCULAR | Status: AC
  Filled 2020-02-04: qty 2

## 2020-02-04 MED ORDER — CHLORHEXIDINE GLUCONATE CLOTH 2 % EX PADS: 6.0000 | MEDICATED_PAD | Freq: Every day | CUTANEOUS | Status: DC

## 2020-02-04 MED ORDER — FENTANYL CITRATE (PF) 250 MCG/5ML IJ SOLN
INTRAMUSCULAR | Status: AC
  Filled 2020-02-04: qty 5

## 2020-02-04 MED ORDER — CEFAZOLIN SODIUM-DEXTROSE 2-4 GM/100ML-% IV SOLN
2.0000 g | Freq: Three times a day (TID) | INTRAVENOUS | Status: AC
  Administered 2020-02-04 (×2): 2 g via INTRAVENOUS
  Filled 2020-02-04 (×2): qty 100

## 2020-02-04 MED ORDER — LACTATED RINGERS IV SOLN: INTRAVENOUS | Status: DC

## 2020-02-04 MED ORDER — DEXAMETHASONE SODIUM PHOSPHATE 10 MG/ML IJ SOLN
INTRAMUSCULAR | Status: DC | PRN
  Administered 2020-02-04: 10 mg via INTRAVENOUS

## 2020-02-04 MED ORDER — HYDROMORPHONE HCL 1 MG/ML IJ SOLN
0.5000 mg | INTRAMUSCULAR | Status: DC | PRN
  Administered 2020-02-04 – 2020-02-06 (×8): 0.5 mg via INTRAVENOUS
  Filled 2020-02-04 (×8): qty 1

## 2020-02-04 MED ORDER — 0.9 % SODIUM CHLORIDE (POUR BTL) OPTIME
TOPICAL | Status: DC | PRN
  Administered 2020-02-04 (×3): 1000 mL

## 2020-02-04 MED ORDER — PHENYLEPHRINE HCL-NACL 10-0.9 MG/250ML-% IV SOLN
INTRAVENOUS | Status: DC | PRN
  Administered 2020-02-04: 25 ug/min via INTRAVENOUS

## 2020-02-04 MED ORDER — SODIUM CHLORIDE 0.9 % IV SOLN
INTRAVENOUS | Status: DC | PRN
Start: 2020-02-04 — End: 2020-02-04

## 2020-02-04 MED ORDER — ONDANSETRON HCL 4 MG PO TABS
4.0000 mg | ORAL_TABLET | ORAL | Status: DC | PRN
  Administered 2020-02-05 – 2020-02-06 (×2): 4 mg via ORAL
  Filled 2020-02-04 (×2): qty 1

## 2020-02-04 MED ORDER — LIDOCAINE-EPINEPHRINE 1 %-1:100000 IJ SOLN
INTRAMUSCULAR | Status: AC
  Filled 2020-02-04: qty 1

## 2020-02-04 MED ORDER — LIDOCAINE-EPINEPHRINE 1 %-1:100000 IJ SOLN
INTRAMUSCULAR | Status: DC | PRN
  Administered 2020-02-04: 10 mL

## 2020-02-04 MED ORDER — ONDANSETRON HCL 4 MG/2ML IJ SOLN
INTRAMUSCULAR | Status: DC | PRN
  Administered 2020-02-04: 4 mg via INTRAVENOUS

## 2020-02-04 MED ORDER — KETAMINE HCL 10 MG/ML IJ SOLN
INTRAMUSCULAR | Status: DC | PRN
  Administered 2020-02-04: 20 mg via INTRAVENOUS

## 2020-02-04 MED ORDER — DEXMEDETOMIDINE (PRECEDEX) IN NS 20 MCG/5ML (4 MCG/ML) IV SYRINGE
PREFILLED_SYRINGE | INTRAVENOUS | Status: DC | PRN
  Administered 2020-02-04 (×3): 4 ug via INTRAVENOUS

## 2020-02-04 MED ORDER — MIDAZOLAM HCL 2 MG/2ML IJ SOLN
INTRAMUSCULAR | Status: DC | PRN
  Administered 2020-02-04 (×2): 1 mg via INTRAVENOUS

## 2020-02-04 MED ORDER — PHENYLEPHRINE HCL (PRESSORS) 10 MG/ML IV SOLN
INTRAVENOUS | Status: DC | PRN
  Administered 2020-02-04: 40 ug via INTRAVENOUS

## 2020-02-04 MED ORDER — SUGAMMADEX SODIUM 200 MG/2ML IV SOLN
INTRAVENOUS | Status: DC | PRN
  Administered 2020-02-04: 100 mg via INTRAVENOUS

## 2020-02-04 MED ORDER — FENTANYL CITRATE (PF) 100 MCG/2ML IJ SOLN
INTRAMUSCULAR | Status: AC
  Filled 2020-02-04: qty 2

## 2020-02-04 MED ORDER — ACETAMINOPHEN 325 MG PO TABS
650.0000 mg | ORAL_TABLET | ORAL | Status: DC | PRN
  Administered 2020-02-07: 650 mg via ORAL
  Filled 2020-02-04: qty 2

## 2020-02-04 MED ORDER — DOCUSATE SODIUM 100 MG PO CAPS
100.0000 mg | ORAL_CAPSULE | Freq: Two times a day (BID) | ORAL | Status: DC
  Administered 2020-02-05 – 2020-02-07 (×4): 100 mg via ORAL
  Filled 2020-02-04 (×5): qty 1

## 2020-02-04 MED ORDER — THROMBIN 5000 UNITS EX SOLR
CUTANEOUS | Status: AC
  Filled 2020-02-04: qty 5000

## 2020-02-04 MED ORDER — LABETALOL HCL 5 MG/ML IV SOLN: 10.0000 mg | INTRAVENOUS | Status: DC | PRN

## 2020-02-04 MED ORDER — BACITRACIN ZINC 500 UNIT/GM EX OINT
TOPICAL_OINTMENT | CUTANEOUS | Status: AC
  Filled 2020-02-04: qty 28.35

## 2020-02-04 MED ORDER — ONDANSETRON HCL 4 MG/2ML IJ SOLN
4.0000 mg | INTRAMUSCULAR | Status: DC | PRN
  Administered 2020-02-04: 4 mg via INTRAVENOUS
  Filled 2020-02-04: qty 2

## 2020-02-04 MED ORDER — THROMBIN 5000 UNITS EX SOLR
OROMUCOSAL | Status: DC | PRN
  Administered 2020-02-04: 5 mL

## 2020-02-04 MED ORDER — ACETAMINOPHEN 650 MG RE SUPP: 650.0000 mg | RECTAL | Status: DC | PRN

## 2020-02-04 MED ORDER — CEFAZOLIN SODIUM-DEXTROSE 2-4 GM/100ML-% IV SOLN
2.0000 g | INTRAVENOUS | Status: AC
  Administered 2020-02-04: 2 g via INTRAVENOUS
  Filled 2020-02-04: qty 100

## 2020-02-04 MED ORDER — FENTANYL CITRATE (PF) 250 MCG/5ML IJ SOLN
INTRAMUSCULAR | Status: DC | PRN
  Administered 2020-02-04: 50 ug via INTRAVENOUS
  Administered 2020-02-04 (×2): 25 ug via INTRAVENOUS
  Administered 2020-02-04 (×3): 50 ug via INTRAVENOUS
  Administered 2020-02-04 (×2): 25 ug via INTRAVENOUS

## 2020-02-04 MED ORDER — THROMBIN 20000 UNITS EX SOLR
CUTANEOUS | Status: AC
  Filled 2020-02-04: qty 20000

## 2020-02-04 SURGICAL SUPPLY — 101 items
ADH SKN CLS APL DERMABOND .7 (GAUZE/BANDAGES/DRESSINGS) ×2
APL SKNCLS STERI-STRIP NONHPOA (GAUZE/BANDAGES/DRESSINGS)
BAND INSRT 18 STRL LF DISP RB (MISCELLANEOUS)
BAND RUBBER #18 3X1/16 STRL (MISCELLANEOUS) IMPLANT
BENZOIN TINCTURE PRP APPL 2/3 (GAUZE/BANDAGES/DRESSINGS) IMPLANT
BLADE CLIPPER SURG (BLADE) ×4 IMPLANT
BLADE SAW GIGLI 16 STRL (MISCELLANEOUS) IMPLANT
BLADE SURG 15 STRL LF DISP TIS (BLADE) IMPLANT
BLADE SURG 15 STRL SS (BLADE)
BNDG CMPR 75X41 PLY HI ABS (GAUZE/BANDAGES/DRESSINGS)
BNDG GAUZE ELAST 4 BULKY (GAUZE/BANDAGES/DRESSINGS) IMPLANT
BNDG STRETCH 4X75 STRL LF (GAUZE/BANDAGES/DRESSINGS) IMPLANT
BUR ACORN 9.0 PRECISION (BURR) ×3 IMPLANT
BUR ACORN 9.0MM PRECISION (BURR) ×1
BUR ROUND FLUTED 4 SOFT TCH (BURR) IMPLANT
BUR ROUND FLUTED 4MM SOFT TCH (BURR)
BUR SPIRAL ROUTER 2.3 (BUR) ×3 IMPLANT
BUR SPIRAL ROUTER 2.3MM (BUR) ×1
CANISTER SUCT 3000ML PPV (MISCELLANEOUS) ×8 IMPLANT
CATH VENTRIC 35X38 W/TROCAR LG (CATHETERS) IMPLANT
CLIP VESOCCLUDE MED 6/CT (CLIP) IMPLANT
CNTNR URN SCR LID CUP LEK RST (MISCELLANEOUS) ×2 IMPLANT
CONT SPEC 4OZ STRL OR WHT (MISCELLANEOUS) ×8
COVER BURR HOLE UNIV 10 (Orthopedic Implant) ×2 IMPLANT
COVER MAYO STAND STRL (DRAPES) IMPLANT
COVER WAND RF STERILE (DRAPES) ×4 IMPLANT
DECANTER SPIKE VIAL GLASS SM (MISCELLANEOUS) ×4 IMPLANT
DERMABOND ADVANCED (GAUZE/BANDAGES/DRESSINGS) ×2
DERMABOND ADVANCED .7 DNX12 (GAUZE/BANDAGES/DRESSINGS) IMPLANT
DRAIN SUBARACHNOID (WOUND CARE) IMPLANT
DRAPE HALF SHEET 40X57 (DRAPES) ×4 IMPLANT
DRAPE MICROSCOPE LEICA (MISCELLANEOUS) IMPLANT
DRAPE NEUROLOGICAL W/INCISE (DRAPES) ×4 IMPLANT
DRAPE STERI IOBAN 125X83 (DRAPES) IMPLANT
DRAPE SURG 17X23 STRL (DRAPES) IMPLANT
DRAPE WARM FLUID 44X44 (DRAPES) ×4 IMPLANT
DRSG ADAPTIC 3X8 NADH LF (GAUZE/BANDAGES/DRESSINGS) IMPLANT
DRSG TELFA 3X8 NADH (GAUZE/BANDAGES/DRESSINGS) IMPLANT
DURAPREP 6ML APPLICATOR 50/CS (WOUND CARE) ×4 IMPLANT
ELECT REM PT RETURN 9FT ADLT (ELECTROSURGICAL) ×4
ELECTRODE REM PT RTRN 9FT ADLT (ELECTROSURGICAL) ×2 IMPLANT
EVACUATOR 1/8 PVC DRAIN (DRAIN) IMPLANT
EVACUATOR SILICONE 100CC (DRAIN) IMPLANT
FORCEPS BIPOLAR SPETZLER 8 1.0 (NEUROSURGERY SUPPLIES) ×4 IMPLANT
GAUZE 4X4 16PLY RFD (DISPOSABLE) IMPLANT
GAUZE SPONGE 4X4 12PLY STRL (GAUZE/BANDAGES/DRESSINGS) IMPLANT
GLOVE BIO SURGEON STRL SZ7 (GLOVE) IMPLANT
GLOVE BIO SURGEON STRL SZ7.5 (GLOVE) ×4 IMPLANT
GLOVE BIOGEL PI IND STRL 7.0 (GLOVE) IMPLANT
GLOVE BIOGEL PI IND STRL 7.5 (GLOVE) ×2 IMPLANT
GLOVE BIOGEL PI INDICATOR 7.0 (GLOVE)
GLOVE BIOGEL PI INDICATOR 7.5 (GLOVE) ×2
GLOVE EXAM NITRILE LRG STRL (GLOVE) IMPLANT
GLOVE EXAM NITRILE XL STR (GLOVE) IMPLANT
GLOVE EXAM NITRILE XS STR PU (GLOVE) IMPLANT
GOWN STRL REUS W/ TWL LRG LVL3 (GOWN DISPOSABLE) ×4 IMPLANT
GOWN STRL REUS W/ TWL XL LVL3 (GOWN DISPOSABLE) IMPLANT
GOWN STRL REUS W/TWL 2XL LVL3 (GOWN DISPOSABLE) IMPLANT
GOWN STRL REUS W/TWL LRG LVL3 (GOWN DISPOSABLE) ×8
GOWN STRL REUS W/TWL XL LVL3 (GOWN DISPOSABLE)
HEMOSTAT POWDER KIT SURGIFOAM (HEMOSTASIS) ×4 IMPLANT
HEMOSTAT SURGICEL 2X14 (HEMOSTASIS) ×4 IMPLANT
HOOK DURA 1/2IN (MISCELLANEOUS) ×4 IMPLANT
IV NS 1000ML (IV SOLUTION) ×4
IV NS 1000ML BAXH (IV SOLUTION) ×2 IMPLANT
KIT BASIN OR (CUSTOM PROCEDURE TRAY) ×4 IMPLANT
KIT DRAIN CSF ACCUDRAIN (MISCELLANEOUS) IMPLANT
KIT TURNOVER KIT B (KITS) ×4 IMPLANT
MARKER SPHERE PSV REFLC 13MM (MARKER) ×12 IMPLANT
NDL SPNL 18GX3.5 QUINCKE PK (NEEDLE) IMPLANT
NEEDLE HYPO 22GX1.5 SAFETY (NEEDLE) ×4 IMPLANT
NEEDLE SPNL 18GX3.5 QUINCKE PK (NEEDLE) IMPLANT
NS IRRIG 1000ML POUR BTL (IV SOLUTION) ×12 IMPLANT
PACK BATTERY CMF DISP FOR DVR (ORTHOPEDIC DISPOSABLE SUPPLIES) ×2 IMPLANT
PACK CRANIOTOMY CUSTOM (CUSTOM PROCEDURE TRAY) ×4 IMPLANT
PAD DRESSING TELFA 3X8 NADH (GAUZE/BANDAGES/DRESSINGS) IMPLANT
PATTIES SURGICAL .25X.25 (GAUZE/BANDAGES/DRESSINGS) IMPLANT
PATTIES SURGICAL .5 X.5 (GAUZE/BANDAGES/DRESSINGS) ×2 IMPLANT
PATTIES SURGICAL .5 X3 (DISPOSABLE) IMPLANT
PATTIES SURGICAL 1/4 X 3 (GAUZE/BANDAGES/DRESSINGS) IMPLANT
PATTIES SURGICAL 1X1 (DISPOSABLE) ×2 IMPLANT
PIN MAYFIELD SKULL DISP (PIN) ×4 IMPLANT
PLATE DOUBLE Y CMF 6H (Plate) ×4 IMPLANT
SCREW UNIII AXS SD 1.5X4 (Screw) ×24 IMPLANT
SPECIMEN JAR SMALL (MISCELLANEOUS) IMPLANT
SPONGE NEURO XRAY DETECT 1X3 (DISPOSABLE) IMPLANT
SPONGE SURGIFOAM ABS GEL 100 (HEMOSTASIS) ×4 IMPLANT
STAPLER VISISTAT 35W (STAPLE) ×4 IMPLANT
SUT ETHILON 3 0 FSL (SUTURE) IMPLANT
SUT ETHILON 3 0 PS 1 (SUTURE) IMPLANT
SUT MNCRL AB 3-0 PS2 18 (SUTURE) IMPLANT
SUT NURALON 4 0 TR CR/8 (SUTURE) ×12 IMPLANT
SUT SILK 0 TIES 10X30 (SUTURE) IMPLANT
SUT VIC AB 2-0 CP2 18 (SUTURE) ×4 IMPLANT
TOWEL GREEN STERILE (TOWEL DISPOSABLE) ×4 IMPLANT
TOWEL GREEN STERILE FF (TOWEL DISPOSABLE) ×4 IMPLANT
TRAY FOLEY MTR SLVR 16FR STAT (SET/KITS/TRAYS/PACK) ×4 IMPLANT
TUBE CONNECTING 12'X1/4 (SUCTIONS) ×1
TUBE CONNECTING 12X1/4 (SUCTIONS) ×3 IMPLANT
UNDERPAD 30X36 HEAVY ABSORB (UNDERPADS AND DIAPERS) ×4 IMPLANT
WATER STERILE IRR 1000ML POUR (IV SOLUTION) ×4 IMPLANT

## 2020-02-04 NOTE — Anesthesia Procedure Notes (Signed)
Arterial Line Insertion Start/End7/27/2021 7:10 AM, 02/04/2020 7:15 AM Performed by: Clearnce Sorrel, CRNA  Patient location: Pre-op. Preanesthetic checklist: patient identified, IV checked, site marked, risks and benefits discussed, surgical consent, monitors and equipment checked, pre-op evaluation, timeout performed and anesthesia consent Lidocaine 1% used for infiltration Left, radial was placed Catheter size: 20 Fr Hand hygiene performed  and maximum sterile barriers used   Attempts: 1 Procedure performed without using ultrasound guided technique. Following insertion, dressing applied. Post procedure assessment: normal and unchanged

## 2020-02-04 NOTE — Op Note (Signed)
PATIENT: Maureen Key  DAY OF SURGERY: 02/04/20   PRE-OPERATIVE DIAGNOSIS:  Brain tumor   POST-OPERATIVE DIAGNOSIS:  Same   PROCEDURE:  Left craniotomy for tumor resection, use of frameless stereotaxy   SURGEON:  Surgeon(s) and Role:    Judith Part, MD - Primary    Consuella Lose, MD - Assisting   ANESTHESIA: ETGA   BRIEF HISTORY: This is a 39 year old left handed woman who presented after a trauma with an incidental left frontal lesion with small areas of enhancement. I recommended surgical resection. This was discussed with the patient as well as risks, benefits, and alternatives and wished to proceed with surgery.   OPERATIVE DETAIL: The patient was taken to the operating room and placed on the OR table in the supine position. A formal time out was performed with two patient identifiers and confirmed the operative site. Anesthesia was induced by the anesthesia team. The [place] head holder was applied to the head and a registration array was attached to the [place]. This was co-registered with the patient's preoperative imaging, the fit appeared to be acceptable. Using frameless stereotaxy, the operative trajectory was planned and the incision was marked. Hair was clipped with surgical clippers over the incision and the area was then prepped and draped in a sterile fashion.  A linear incision was placed in the left frontal region. Soft tissues were dissected and retracted, retractors were placed, and a craniotomy flap was turned. This was performed by making a burr hole over the lateral superior sagittal sinus to confirm location, then use a foot plate to complete the bone flap creation, staying medial to the sinus. The dura was then opened and flapped medially.   The superficial tissue was clearly abnormal and corresponded to the imaging findings. A frozen sample was sent for analysis and dissection was started. Subpial dissection was performed around the tumor  laterally and posteriorly. Anteriorly, it involved an adjacent gyrus that was included in the resection bloc. The medial aspect was dissected free and Dr. Kathyrn Sheriff helped retract and elevate the the tumor while I dissected the deep border and resected it en bloc. This was sent to pathology for analysis.   All instrument and sponge counts were correct, the bone flap was replaced with titanium plates and screws with attention to avoid instrumentation or a gap in the kerf on the forehead. The incision was then copiously irrigated and closed in layers. The patient was then returned to anesthesia for emergence. No apparent complications at the completion of the procedure.   EBL:  273mL   DRAINS: none   SPECIMENS: Left frontal brain tumor   Judith Part, MD 02/04/20 11:54 AM

## 2020-02-04 NOTE — Transfer of Care (Signed)
Immediate Anesthesia Transfer of Care Note  Patient: Maureen Key  Procedure(s) Performed: LEFT CRANIOTOMY FOR TUMOR RESECTION (Left Head) APPLICATION OF CRANIAL NAVIGATION (N/A Head)  Patient Location: PACU  Anesthesia Type:General  Level of Consciousness: awake, alert  and oriented  Airway & Oxygen Therapy: Patient Spontanous Breathing and Patient connected to face mask oxygen  Post-op Assessment: Report given to RN and Post -op Vital signs reviewed and stable  Post vital signs: Reviewed and stable  Last Vitals:  Vitals Value Taken Time  BP 115/52 02/04/20 1201  Temp 36.8 C 02/04/20 1200  Pulse 84 02/04/20 1204  Resp 20 02/04/20 1204  SpO2 100 % 02/04/20 1204  Vitals shown include unvalidated device data.  Last Pain:  Vitals:   02/04/20 0635  TempSrc:   PainSc: 0-No pain         Complications: No complications documented.

## 2020-02-04 NOTE — Anesthesia Postprocedure Evaluation (Signed)
Anesthesia Post Note  Patient: Maureen Key  Procedure(s) Performed: LEFT CRANIOTOMY FOR TUMOR RESECTION (Left Head) APPLICATION OF CRANIAL NAVIGATION (N/A Head)     Patient location during evaluation: PACU Anesthesia Type: General Level of consciousness: awake and alert Pain management: pain level controlled Vital Signs Assessment: post-procedure vital signs reviewed and stable Respiratory status: spontaneous breathing, nonlabored ventilation, respiratory function stable and patient connected to nasal cannula oxygen Cardiovascular status: blood pressure returned to baseline and stable Postop Assessment: no apparent nausea or vomiting Anesthetic complications: no   No complications documented.  Last Vitals:  Vitals:   02/04/20 1615 02/04/20 1636  BP:  (!) 129/91  Pulse:  62  Resp:  17  Temp: 37.4 C 37.1 C  SpO2:  100%    Last Pain:  Vitals:   02/04/20 1636  TempSrc: Oral  PainSc:                  Catalina Gravel

## 2020-02-04 NOTE — Brief Op Note (Signed)
02/04/2020  11:42 AM  PATIENT:  Maureen Key  39 y.o. female  PRE-OPERATIVE DIAGNOSIS:  BRAIN TUMOR  POST-OPERATIVE DIAGNOSIS:  BRAIN TUMOR  PROCEDURE:  Procedure(s) with comments: LEFT CRANIOTOMY FOR TUMOR RESECTION (Left) - LEFT CRANIOTOMY FOR TUMOR RESECTION APPLICATION OF CRANIAL NAVIGATION (N/A) - APPLICATION OF CRANIAL NAVIGATION  SURGEON:  Surgeon(s) and Role:    * Brentyn Seehafer, Joyice Faster, MD - Primary    * Consuella Lose, MD - Assisting  PHYSICIAN ASSISTANT:   ANESTHESIA:   general  EBL:  100 mL   BLOOD ADMINISTERED:none  DRAINS: none   LOCAL MEDICATIONS USED:  LIDOCAINE   SPECIMEN:  Source of Specimen:  Left frontal brain tumor  DISPOSITION OF SPECIMEN:  PATHOLOGY  COUNTS:  YES  TOURNIQUET:  * No tourniquets in log *  DICTATION: .Note written in [place]: Admit to inpatient   PATIENT DISPOSITION:  PACU - hemodynamically stable.   Delay start of Pharmacological VTE agent (>24hrs) due to surgical blood loss or risk of bleeding: yes

## 2020-02-04 NOTE — Anesthesia Procedure Notes (Signed)
Procedure Name: Intubation Date/Time: 02/04/2020 7:37 AM Performed by: Clearnce Sorrel, CRNA Pre-anesthesia Checklist: Patient identified, Emergency Drugs available, Suction available, Patient being monitored and Timeout performed Patient Re-evaluated:Patient Re-evaluated prior to induction Oxygen Delivery Method: Circle system utilized Preoxygenation: Pre-oxygenation with 100% oxygen Induction Type: IV induction Ventilation: Mask ventilation without difficulty Laryngoscope Size: Mac and 3 Grade View: Grade I Tube type: Oral Tube size: 7.0 mm Number of attempts: 1 Airway Equipment and Method: Stylet Placement Confirmation: ETT inserted through vocal cords under direct vision,  positive ETCO2 and breath sounds checked- equal and bilateral Secured at: 22 cm Tube secured with: Tape Dental Injury: Teeth and Oropharynx as per pre-operative assessment

## 2020-02-04 NOTE — H&P (Signed)
Surgical H&P Update  HPI: 39 y.o. woman with left frontal brain tumor, here for surgical resection. No changes in health since she was last seen, no new seizures or auras. Lower extremity frx healed well and is ambulating short distances now w/o assistance.  PMHx:  Past Medical History:  Diagnosis Date  . Blood transfusion without reported diagnosis    last [place] delivery  . Dysmenorrhea   . History of placenta accreta in prior pregnancy, currently pregnant   . History of postpartum hemorrhage, currently pregnant   . History of sepsis   . Pregnancy induced hypertension   . Sickle cell trait ([place])   . Trichimoniasis   . Vaginal Pap smear, abnormal    FamHx:  Family History  Problem Relation Age of Onset  . Hypertension Mother   . HIV/AIDS Father   . Cancer Father        lung  . Diabetes Sister   . Diabetes Maternal Aunt   . Diabetes Maternal Uncle   . Cancer Paternal Aunt   . Cancer Paternal Uncle   . Diabetes Maternal Grandmother   . Alzheimer's disease Paternal Grandmother    SocHx:  reports that she has been smoking. She has been smoking about 0.25 packs per day. She has never used smokeless tobacco. She reports previous drug use. Drug: Marijuana. She reports that she does not drink alcohol.  Physical Exam: AOx3, PERRL, FS, TM  Strength 5/5 x4 except 4+/5 in R ankle, SILTx4  Assesment/Plan: 39 y.o. woman with L F brain tumor, here for craniotomy and tumor resection. Risks, benefits, and alternatives discussed and the patient would like to continue with surgery.  -OR today -4N post-op  Judith Part, MD 02/04/20 7:22 AM

## 2020-02-05 ENCOUNTER — Encounter (HOSPITAL_COMMUNITY): Payer: Self-pay | Admitting: Neurological Surgery

## 2020-02-05 ENCOUNTER — Inpatient Hospital Stay (HOSPITAL_COMMUNITY): Payer: 59

## 2020-02-05 ENCOUNTER — Encounter (HOSPITAL_COMMUNITY): Payer: Medicaid Other

## 2020-02-05 MED ORDER — GADOBUTROL 1 MMOL/ML IV SOLN
4.5000 mL | Freq: Once | INTRAVENOUS | Status: AC | PRN
  Administered 2020-02-05: 4.5 mL via INTRAVENOUS

## 2020-02-05 MED ORDER — FAMOTIDINE 20 MG PO TABS
20.0000 mg | ORAL_TABLET | Freq: Two times a day (BID) | ORAL | Status: DC
  Administered 2020-02-05 – 2020-02-07 (×4): 20 mg via ORAL
  Filled 2020-02-05 (×4): qty 1

## 2020-02-05 NOTE — Evaluation (Signed)
Occupational Therapy Evaluation Patient Details Name: Maureen Key MRN: [id number] DOB: 11/18/1980 Today's Date: 02/05/2020    History of Present Illness 39 y.o. woman with left frontal brain tumor presenting for crani and tumor resection. Pt also with recent history of closed displaced spiral fx of R tibia s/p IM nailing on 12/13/2019. Other PMH includes sickle cell trait, hysterectomy. Pt underwent L frontal crani and tumor resection on 02/04/2020.   Clinical Impression   Patient evaluated by Occupational Therapy with no further acute OT needs identified. All education has been completed and the patient has no further questions. See below for any follow-up Occupational Therapy or equipment needs. OT to sign off. Thank you for referral.      Follow Up Recommendations  No OT follow up    Equipment Recommendations  None recommended by OT    Recommendations for Other Services       Precautions / Restrictions Precautions Precautions: Fall      Mobility Bed Mobility Overal bed mobility: Independent             General bed mobility comments: cues for rolling to help with pain management  Transfers Overall transfer level: Independent                    Balance                                           ADL either performed or assessed with clinical judgement   ADL Overall ADL's : Modified independent                                             Vision Baseline Vision/History: No visual deficits Additional Comments: able to read menu no changes reported     Perception     Praxis      Pertinent Vitals/Pain Pain Assessment: 0-10 Pain Score: 4  Pain Location: head Pain Descriptors / Indicators: Aching Pain Intervention(s): Monitored during session;Premedicated before session;Repositioned     Hand Dominance     Extremity/Trunk Assessment Upper Extremity Assessment Upper Extremity Assessment: Overall WFL for  tasks assessed   Lower Extremity Assessment Lower Extremity Assessment: Defer to PT evaluation       Communication Communication Communication: No difficulties   Cognition Arousal/Alertness: Awake/alert Behavior During Therapy: WFL for tasks assessed/performed Overall Cognitive Status: Within Functional Limits for tasks assessed                                     General Comments  VSS. educated on toilet paper location,use of cup at sink for oral care and figure 4 dressing ot help wtih pain management with decrease head tilts. educated on washing with clean wash cloth    Exercises     Shoulder Instructions      Home Living Family/patient expects to be discharged to:: Private residence Living Arrangements: Spouse/significant other;Children Available Help at Discharge: Family Type of Home: House Home Access: Stairs to enter;Ramped entrance Entrance Stairs-Number of Steps: 2 Entrance Stairs-Rails: None Home Layout: Two level;Able to live on main level with bedroom/bathroom Alternate Level Stairs-Number of Steps: flight   Bathroom Shower/Tub: Teacher, early years/pre: Standard  Bathroom Accessibility: No   Home Equipment: Walker - 2 wheels;Bedside commode          Prior Functioning/Environment Level of Independence: Independent        Comments: working at [employer] prior to [employer]        OT Problem List:        OT Treatment/Interventions:      OT Goals(Current goals can be found in the care plan section) Acute Rehab OT Goals Patient Stated Goal: To go home  OT Frequency:     Barriers to D/C:            Co-evaluation              AM-PAC OT "6 Clicks" Daily Activity     Outcome Measure Help from another person eating meals?: None Help from another person taking care of personal grooming?: None Help from another person toileting, which includes using toliet, bedpan, or urinal?: None Help from another person bathing  (including washing, rinsing, drying)?: None Help from another person to put on and taking off regular upper body clothing?: None Help from another person to put on and taking off regular lower body clothing?: None 6 Click Score: 24   End of Session Equipment Utilized During Treatment: Gait belt Nurse Communication: Mobility status;Precautions  Activity Tolerance: Patient tolerated treatment well Patient left: in bed;with call bell/phone within reach;with bed alarm set  OT Visit Diagnosis: Unsteadiness on feet (R26.81)                Time: 2725-3664 OT Time Calculation (min): 13 min Charges:  OT General Charges $OT Visit: 1 Visit OT Evaluation $OT Eval Low Complexity: 1 Low   Brynn, OTR/L  Acute Rehabilitation Services Pager: [phone] Office: [phone] .   Jeri Modena 02/05/2020, 4:44 PM

## 2020-02-05 NOTE — Progress Notes (Signed)
Neurosurgery Service Progress Note  Subjective: No acute events overnight, no new complaints   Objective: Vitals:   02/05/20 0500 02/05/20 0600 02/05/20 0700 02/05/20 0800  BP: 123/72 118/80 (!) 94/62 (!) 93/62  Pulse:   70 66  Resp: 19 19 15 13   Temp:    99 F (37.2 C)  TempSrc:    Oral  SpO2:   96% 97%  Weight:      Height:       Temp (24hrs), Avg:98.9 F (37.2 C), Min:98.2 F (36.8 C), Max:99.4 F (37.4 C)  CBC Latest Ref Rng & Units 01/31/2020 12/14/2019 12/12/2019  WBC 4.0 - 10.5 K/uL 5.2 10.5 8.2  Hemoglobin 12.0 - 15.0 g/dL 12.6 11.4(L) 13.5  Hematocrit 36 - 46 % 37.2 32.6(L) 38.1  Platelets 150 - 400 K/uL 262 158 216   BMP Latest Ref Rng & Units 01/31/2020 12/14/2019 12/12/2019  Glucose 70 - 99 mg/dL 82 123(H) 115(H)  BUN 6 - 20 mg/dL 9 5(L) 8  Creatinine 0.44 - 1.00 mg/dL 0.67 0.63 0.59  BUN/Creat Ratio 9 - 23 - - -  Sodium 135 - 145 mmol/L 136 137 141  Potassium 3.5 - 5.1 mmol/L 3.6 4.1 3.7  Chloride 98 - 111 mmol/L 101 104 106  CO2 22 - 32 mmol/L 27 26 23   Calcium 8.9 - 10.3 mg/dL 9.6 8.8(L) 9.0    Intake/Output Summary (Last 24 hours) at 02/05/2020 6962 Last data filed at 02/05/2020 0800 Gross per 24 hour  Intake 2140.02 ml  Output 2440 ml  Net -299.98 ml    Current Facility-Administered Medications:  .  acetaminophen (TYLENOL) tablet 650 mg, 650 mg, Oral, Q4H PRN **OR** acetaminophen (TYLENOL) suppository 650 mg, 650 mg, Rectal, Q4H PRN, Yaslene Lindamood A, MD .  Chlorhexidine Gluconate Cloth 2 % PADS 6 each, 6 each, Topical, Daily, Alexiya Franqui A, MD .  docusate sodium (COLACE) capsule 100 mg, 100 mg, Oral, BID, Keaston Pile A, MD .  famotidine (PEPCID) IVPB 20 mg premix, 20 mg, Intravenous, Q12H, Evangelene Vora, Joyice Faster, MD, Last Rate: 100 mL/hr at 02/04/20 2252, 20 mg at 02/04/20 2252 .  HYDROcodone-acetaminophen (NORCO/VICODIN) 5-325 MG per tablet 1 tablet, 1 tablet, Oral, Q4H PRN, Judith Part, MD, 1 tablet at 02/05/20 0831 .  HYDROmorphone  (DILAUDID) injection 0.5 mg, 0.5 mg, Intravenous, Q3H PRN, Judith Part, MD, 0.5 mg at 02/05/20 0611 .  labetalol (NORMODYNE) injection 10-40 mg, 10-40 mg, Intravenous, Q10 min PRN, Judith Part, MD .  ondansetron (ZOFRAN) tablet 4 mg, 4 mg, Oral, Q4H PRN, 4 mg at 02/05/20 0328 **OR** ondansetron (ZOFRAN) injection 4 mg, 4 mg, Intravenous, Q4H PRN, Judith Part, MD, 4 mg at 02/04/20 2307 .  polyethylene glycol (MIRALAX / GLYCOLAX) packet 17 g, 17 g, Oral, Daily PRN, Travon Crochet A, MD .  promethazine (PHENERGAN) tablet 12.5-25 mg, 12.5-25 mg, Oral, Q4H PRN, Judith Part, MD, 25 mg at 02/04/20 1711   Physical Exam: AOx3, PERRL, EOMI, FS, Strength 5/5 x4, SILTx4, no drift  Assessment & Plan: 39 y.o. woman s/p resection of brain tumor, recovering well.  -final path pending -SCDs/TEDs, SQH POD2 -PT/OT -transfer to stepdown  [place]  02/05/20 9:02 AM

## 2020-02-05 NOTE — Evaluation (Signed)
Physical Therapy Evaluation Patient Details Name: Maureen Key MRN: [id number] DOB: 03/12/1981 Today's Date: 02/05/2020   History of Present Illness  39 y.o. woman with left frontal brain tumor presenting for crani and tumor resection. Pt also with recent history of closed displaced spiral fx of R tibia s/p IM nailing on 12/13/2019. Other PMH includes sickle cell trait, hysterectomy. Pt underwent L frontal crani and tumor resection on 02/04/2020.  Clinical Impression  Pt presents to PT with deficits in gait, balance, functional mobility, endurance. Pt does not appear far from her recent baseline since tibia fracture, ambulating with reduced stance time on RLE but is otherwise at a supervision level. Pt does demonstrate some balance deviations upon initially standing with increased postural sway, may be related to reports of mild dizziness. Pt will benefit from continued acute PT POC to further challenge dynamic gait and balance and to continue improving upon gait pattern. PT recommends return to outpatient PT for further gait and balance training at the time of discharge.    Follow Up Recommendations Outpatient PT (continue for gait training)    Equipment Recommendations  None recommended by PT    Recommendations for Other Services       Precautions / Restrictions Precautions Precautions: Fall Restrictions Weight Bearing Restrictions: No      Mobility  Bed Mobility Overal bed mobility: Modified Independent                Transfers Overall transfer level: Needs assistance Equipment used: None Transfers: Sit to/from Stand Sit to Stand: Supervision         General transfer comment: pt initially with increased sway upon standing, does cite some dizziness  Ambulation/Gait Ambulation/Gait assistance: Supervision Gait Distance (Feet): 250 Feet Assistive device: None Gait Pattern/deviations: Step-through pattern Gait velocity: reduced Gait velocity interpretation:  1.31 - 2.62 ft/sec, indicative of limited community ambulator General Gait Details: steady step through gait, no significant balance deviations noted  Stairs            Wheelchair Mobility    Modified Rankin (Stroke Patients Only)       Balance Overall balance assessment: Needs assistance Sitting-balance support: No upper extremity supported;Feet supported Sitting balance-Leahy Scale: Good Sitting balance - Comments: pt able to don boot at edge of bed in sitting, cues to elevate LE to avoid placing head in a dependent position   Standing balance support: No upper extremity supported Standing balance-Leahy Scale: Good Standing balance comment: supervision, increased sway initially with standign but improves throughout session                             Pertinent Vitals/Pain Pain Assessment: 0-10 Pain Score: 6  Pain Location: head Pain Descriptors / Indicators: Aching Pain Intervention(s): Monitored during session    Home Living Family/patient expects to be discharged to:: Private residence Living Arrangements: Spouse/significant other;Children Available Help at Discharge: Family Type of Home: House Home Access: Stairs to enter;Ramped entrance Entrance Stairs-Rails: None Entrance Stairs-Number of Steps: 2 Home Layout: Two level;Able to live on main level with bedroom/bathroom Home Equipment: Gilford Rile - 2 wheels;Bedside commode      Prior Function Level of Independence: Independent         Comments: working at [employer] prior to [employer]        Extremity/Trunk Assessment   Upper Extremity Assessment Upper Extremity Assessment: Overall WFL for tasks assessed    Lower Extremity  Assessment Lower Extremity Assessment: Overall WFL for tasks assessed    Cervical / Trunk Assessment Cervical / Trunk Assessment: Normal  Communication   Communication: No difficulties  Cognition Arousal/Alertness: Awake/alert Behavior During  Therapy: WFL for tasks assessed/performed Overall Cognitive Status: Within Functional Limits for tasks assessed                                        General Comments General comments (skin integrity, edema, etc.): VSS on RA    Exercises     Assessment/Plan    PT Assessment Patient needs continued PT services  PT Problem List Decreased activity tolerance;Decreased balance;Decreased mobility;Decreased knowledge of precautions;Pain       PT Treatment Interventions DME instruction;Gait training;Stair training;Functional mobility training;Balance training;Neuromuscular re-education;Patient/family education    PT Goals (Current goals can be found in the Care Plan section)  Acute Rehab PT Goals Patient Stated Goal: To go home PT Goal Formulation: With patient Time For Goal Achievement: 02/19/20 Potential to Achieve Goals: Good    Frequency Min 4X/week   Barriers to discharge        Co-evaluation               AM-PAC PT "6 Clicks" Mobility  Outcome Measure Help needed turning from your back to your side while in a flat bed without using bedrails?: None Help needed moving from lying on your back to sitting on the side of a flat bed without using bedrails?: None Help needed moving to and from a bed to a chair (including a wheelchair)?: None Help needed standing up from a chair using your arms (e.g., wheelchair or bedside chair)?: None Help needed to walk in hospital room?: None Help needed climbing 3-5 steps with a railing? : A Little 6 Click Score: 23    End of Session   Activity Tolerance: Patient tolerated treatment well Patient left: in chair;with call bell/phone within reach;with family/visitor present Nurse Communication: Mobility status PT Visit Diagnosis: Other abnormalities of gait and mobility (R26.89);Pain Pain - part of body:  (head)    Time: 9767-3419 PT Time Calculation (min) (ACUTE ONLY): 15 min   Charges:   PT Evaluation $PT  Eval Moderate Complexity: 1 Mod          Zenaida Niece, PT, DPT Acute Rehabilitation Pager: [phone]   Zenaida Niece 02/05/2020, 12:21 PM

## 2020-02-06 MED ORDER — DEXAMETHASONE SODIUM PHOSPHATE 10 MG/ML IJ SOLN
10.0000 mg | Freq: Once | INTRAMUSCULAR | Status: AC
  Administered 2020-02-06: 10 mg via INTRAVENOUS
  Filled 2020-02-06: qty 1

## 2020-02-06 MED ORDER — OXYCODONE HCL 5 MG PO TABS
10.0000 mg | ORAL_TABLET | ORAL | Status: DC | PRN
  Administered 2020-02-06 (×3): 10 mg via ORAL
  Filled 2020-02-06 (×3): qty 2

## 2020-02-06 MED ORDER — OXYCODONE HCL 5 MG PO TABS
5.0000 mg | ORAL_TABLET | ORAL | Status: DC | PRN
  Administered 2020-02-07 (×2): 5 mg via ORAL
  Filled 2020-02-06 (×2): qty 1

## 2020-02-06 NOTE — Progress Notes (Signed)
Physical Therapy Treatment Patient Details Name: Maureen Key MRN: [id number] DOB: 1981/05/14 Today's Date: 02/06/2020    History of Present Illness 39 y.o. woman with left frontal brain tumor presenting for crani and tumor resection. Pt also with recent history of closed displaced spiral fx of R tibia s/p IM nailing on 12/13/2019. Other PMH includes sickle cell trait, hysterectomy. Pt underwent L frontal crani and tumor resection on 02/04/2020.    PT Comments    Pt tolerates treatment well with increased ambulation distance, gait speed, and less significant gait and balance deviations this session. Pt reports that she is at her baseline since tibia fracture and feels confident in her mobility at this time. Pt has no further acute PT needs and is encouraged to ambulate out of the room at least 3 times a day for the remainder of her hospitalization. PT recommends continued outpatient PT for management of rehab after tibia fracture (ROM, strength, gait and balance). Acute PT signing off at this time.   Follow Up Recommendations  Outpatient PT (continue outpatient PT for ROM and gait training RLE)     Equipment Recommendations  None recommended by PT    Recommendations for Other Services       Precautions / Restrictions Precautions Precautions: Fall Restrictions Weight Bearing Restrictions: No    Mobility  Bed Mobility               General bed mobility comments: pt received and left in recliner  Transfers Overall transfer level: Independent Equipment used: None Transfers: Sit to/from Stand Sit to Stand: Independent            Ambulation/Gait Ambulation/Gait assistance: Independent Gait Distance (Feet): 300 Feet Assistive device: None Gait Pattern/deviations: Step-through pattern Gait velocity: functional Gait velocity interpretation: >2.62 ft/sec, indicative of community ambulatory General Gait Details: steady step through gait, no significant balance  deviations noted   Stairs Stairs:  (pt refuses stairs, has ramp and can live on 1st floor)           Wheelchair Mobility    Modified Rankin (Stroke Patients Only)       Balance Overall balance assessment: No apparent balance deficits (not formally assessed)                                          Cognition Arousal/Alertness: Awake/alert Behavior During Therapy: WFL for tasks assessed/performed Overall Cognitive Status: Within Functional Limits for tasks assessed                                        Exercises      General Comments General comments (skin integrity, edema, etc.): VSS      Pertinent Vitals/Pain Pain Assessment: Faces Pain Score: 2  Pain Location: head Pain Descriptors / Indicators: Grimacing Pain Intervention(s): Monitored during session    Home Living                      Prior Function            PT Goals (current goals can now be found in the care plan section) Acute Rehab PT Goals Patient Stated Goal: To go home Progress towards PT goals: Goals met/education completed, patient discharged from PT    Frequency  PT Plan Current plan remains appropriate    Co-evaluation              AM-PAC PT "6 Clicks" Mobility   Outcome Measure  Help needed turning from your back to your side while in a flat bed without using bedrails?: None Help needed moving from lying on your back to sitting on the side of a flat bed without using bedrails?: None Help needed moving to and from a bed to a chair (including a wheelchair)?: None Help needed standing up from a chair using your arms (e.g., wheelchair or bedside chair)?: None Help needed to walk in hospital room?: None Help needed climbing 3-5 steps with a railing? : A Little 6 Click Score: 23    End of Session   Activity Tolerance: Patient tolerated treatment well Patient left: in chair;with call bell/phone within reach Nurse  Communication: Mobility status PT Visit Diagnosis: Other abnormalities of gait and mobility (R26.89);Pain     Time: 9199-5790 PT Time Calculation (min) (ACUTE ONLY): 12 min  Charges:  $Gait Training: 8-22 mins                     Zenaida Niece, PT, DPT Acute Rehabilitation Pager: [phone]    Zenaida Niece 02/06/2020, 2:27 PM

## 2020-02-06 NOTE — Progress Notes (Signed)
Neurosurgery Service Progress Note  Subjective: No acute events overnight, headache starting to wear her down, feels isolated in the hospital & not seeing her kids, tried to encourage her that she's getting through the worst part of things  Objective: Vitals:   02/05/20 2029 02/06/20 0010 02/06/20 0012 02/06/20 0748  BP: 105/75  94/66 112/77  Pulse: 70  67 69  Resp: 15  14 12   Temp: 99.1 F (37.3 C) 99 F (37.2 C) 99 F (37.2 C) 98.3 F (36.8 C)  TempSrc: Oral Oral Oral Oral  SpO2: 100%  100% 100%  Weight:      Height:       Temp (24hrs), Avg:98.8 F (37.1 C), Min:98.3 F (36.8 C), Max:99.1 F (37.3 C)  CBC Latest Ref Rng & Units 01/31/2020 12/14/2019 12/12/2019  WBC 4.0 - 10.5 K/uL 5.2 10.5 8.2  Hemoglobin 12.0 - 15.0 g/dL 12.6 11.4(L) 13.5  Hematocrit 36 - 46 % 37.2 32.6(L) 38.1  Platelets 150 - 400 K/uL 262 158 216   BMP Latest Ref Rng & Units 01/31/2020 12/14/2019 12/12/2019  Glucose 70 - 99 mg/dL 82 123(H) 115(H)  BUN 6 - 20 mg/dL 9 5(L) 8  Creatinine 0.44 - 1.00 mg/dL 0.67 0.63 0.59  BUN/Creat Ratio 9 - 23 - - -  Sodium 135 - 145 mmol/L 136 137 141  Potassium 3.5 - 5.1 mmol/L 3.6 4.1 3.7  Chloride 98 - 111 mmol/L 101 104 106  CO2 22 - 32 mmol/L 27 26 23   Calcium 8.9 - 10.3 mg/dL 9.6 8.8(L) 9.0    Intake/Output Summary (Last 24 hours) at 02/06/2020 2671 Last data filed at 02/05/2020 2458 Gross per 24 hour  Intake --  Output 325 ml  Net -325 ml    Current Facility-Administered Medications:  .  acetaminophen (TYLENOL) tablet 650 mg, 650 mg, Oral, Q4H PRN **OR** acetaminophen (TYLENOL) suppository 650 mg, 650 mg, Rectal, Q4H PRN, Aury Scollard A, MD .  Chlorhexidine Gluconate Cloth 2 % PADS 6 each, 6 each, Topical, Daily, Eugene Zeiders A, MD .  docusate sodium (COLACE) capsule 100 mg, 100 mg, Oral, BID, Denya Buckingham A, MD, 100 mg at 02/05/20 2147 .  famotidine (PEPCID) tablet 20 mg, 20 mg, Oral, BID, Judith Part, MD, 20 mg at 02/05/20 2147 .   HYDROcodone-acetaminophen (NORCO/VICODIN) 5-325 MG per tablet 1 tablet, 1 tablet, Oral, Q4H PRN, Judith Part, MD, 1 tablet at 02/06/20 0818 .  HYDROmorphone (DILAUDID) injection 0.5 mg, 0.5 mg, Intravenous, Q3H PRN, Judith Part, MD, 0.5 mg at 02/06/20 0306 .  labetalol (NORMODYNE) injection 10-40 mg, 10-40 mg, Intravenous, Q10 min PRN, Judith Part, MD .  ondansetron (ZOFRAN) tablet 4 mg, 4 mg, Oral, Q4H PRN, 4 mg at 02/05/20 0328 **OR** ondansetron (ZOFRAN) injection 4 mg, 4 mg, Intravenous, Q4H PRN, Judith Part, MD, 4 mg at 02/04/20 2307 .  polyethylene glycol (MIRALAX / GLYCOLAX) packet 17 g, 17 g, Oral, Daily PRN, Annalaya Wile A, MD .  promethazine (PHENERGAN) tablet 12.5-25 mg, 12.5-25 mg, Oral, Q4H PRN, Judith Part, MD, 25 mg at 02/04/20 1711   Physical Exam: AOx3, PERRL, EOMI, FS, Strength 5/5 x4, SILTx4, no drift  Assessment & Plan: 39 y.o. woman s/p resection of brain tumor, recovering well. Post-op MRI with GTR  -final path pending -SCDs/TEDs, SQH -will change hydrocodone --> oxycodone for better pain control, dose of steroids to help with pain / swelling -PT/OT rec outpt PT, no OT f/u  Judith Part  02/06/20  8:38 AM

## 2020-02-06 NOTE — Progress Notes (Signed)
Initial Nutrition Assessment  DOCUMENTATION CODES:   Not applicable  INTERVENTION:   Encourage PO intake  Magic cup TID with meals, each supplement provides 290 kcal and 9 grams of protein   NUTRITION DIAGNOSIS:   Increased nutrient needs related to post-op healing as evidenced by estimated needs.  GOAL:   Patient will meet greater than or equal to 90% of their needs  MONITOR:   PO intake, Supplement acceptance  REASON FOR ASSESSMENT:   Malnutrition Screening Tool    ASSESSMENT:   Pt with PMH of sickle cell trait admitted for L frontal brain tumor s/p craniotomy with tumor resection.   Pt complaining of headache. Pathology report pending.   Medications reviewed and include: decadron, colace Labs reviewed    Diet Order:   Diet Order            Diet regular Room service appropriate? Yes; Fluid consistency: Thin  Diet effective now                 EDUCATION NEEDS:   No education needs have been identified at this time  Skin:     Last BM:  unknown  Height:   Ht Readings from Last 1 Encounters:  02/04/20 5\' 2"  (1.575 m)    Weight:   Wt Readings from Last 1 Encounters:  02/04/20 47.6 kg    Ideal Body Weight:  50 kg  BMI:  Body mass index is 19.2 kg/m.  Estimated Nutritional Needs:   Kcal:  1400-1600  Protein:  75-85 grams  Fluid:  > 1.5 L/day  Lockie Pares., RD, LDN, CNSC See [facility] for contact information

## 2020-02-07 ENCOUNTER — Encounter (HOSPITAL_COMMUNITY): Payer: Medicaid Other | Admitting: Physical Therapy

## 2020-02-07 MED ORDER — OXYCODONE HCL 5 MG PO TABS: 5.0000 mg | ORAL_TABLET | ORAL | 0 refills | Status: DC | PRN

## 2020-02-07 NOTE — Progress Notes (Signed)
Neurosurgery Service Progress Note  Subjective: No acute events overnight, pain and mood improved today, starting to feel more like herself  Objective: Vitals:   02/06/20 2006 02/07/20 0018 02/07/20 0433 02/07/20 0443  BP: 90/69 (!) 92/62 (!) 90/61 103/72  Pulse: 72 68 59 58  Resp: 14 17 22 19   Temp: 98.2 F (36.8 C) 98.2 F (36.8 C) 98.1 F (36.7 C) 98.1 F (36.7 C)  TempSrc: Oral Oral Oral   SpO2: 99% 98% 100% 100%  Weight:      Height:       Temp (24hrs), Avg:98.2 F (36.8 C), Min:98.1 F (36.7 C), Max:98.6 F (37 C)  CBC Latest Ref Rng & Units 01/31/2020 12/14/2019 12/12/2019  WBC 4.0 - 10.5 K/uL 5.2 10.5 8.2  Hemoglobin 12.0 - 15.0 g/dL 12.6 11.4(L) 13.5  Hematocrit 36 - 46 % 37.2 32.6(L) 38.1  Platelets 150 - 400 K/uL 262 158 216   BMP Latest Ref Rng & Units 01/31/2020 12/14/2019 12/12/2019  Glucose 70 - 99 mg/dL 82 123(H) 115(H)  BUN 6 - 20 mg/dL 9 5(L) 8  Creatinine 0.44 - 1.00 mg/dL 0.67 0.63 0.59  BUN/Creat Ratio 9 - 23 - - -  Sodium 135 - 145 mmol/L 136 137 141  Potassium 3.5 - 5.1 mmol/L 3.6 4.1 3.7  Chloride 98 - 111 mmol/L 101 104 106  CO2 22 - 32 mmol/L 27 26 23   Calcium 8.9 - 10.3 mg/dL 9.6 8.8(L) 9.0    Intake/Output Summary (Last 24 hours) at 02/07/2020 0739 Last data filed at 02/07/2020 0600 Gross per 24 hour  Intake 960 ml  Output --  Net 960 ml    Current Facility-Administered Medications:  .  acetaminophen (TYLENOL) tablet 650 mg, 650 mg, Oral, Q4H PRN, 650 mg at 02/07/20 0413 **OR** acetaminophen (TYLENOL) suppository 650 mg, 650 mg, Rectal, Q4H PRN, Judith Part, MD .  Chlorhexidine Gluconate Cloth 2 % PADS 6 each, 6 each, Topical, Daily, Willies Laviolette A, MD .  docusate sodium (COLACE) capsule 100 mg, 100 mg, Oral, BID, Judith Part, MD, 100 mg at 02/06/20 2143 .  famotidine (PEPCID) tablet 20 mg, 20 mg, Oral, BID, Judith Part, MD, 20 mg at 02/06/20 2142 .  HYDROmorphone (DILAUDID) injection 0.5 mg, 0.5 mg, Intravenous,  Q3H PRN, Judith Part, MD, 0.5 mg at 02/06/20 1235 .  labetalol (NORMODYNE) injection 10-40 mg, 10-40 mg, Intravenous, Q10 min PRN, Judith Part, MD .  ondansetron (ZOFRAN) tablet 4 mg, 4 mg, Oral, Q4H PRN, 4 mg at 02/06/20 1230 **OR** ondansetron (ZOFRAN) injection 4 mg, 4 mg, Intravenous, Q4H PRN, Judith Part, MD, 4 mg at 02/04/20 2307 .  oxyCODONE (Oxy IR/ROXICODONE) immediate release tablet 10 mg, 10 mg, Oral, Q4H PRN, Judith Part, MD, 10 mg at 02/06/20 1842 .  oxyCODONE (Oxy IR/ROXICODONE) immediate release tablet 5 mg, 5 mg, Oral, Q4H PRN, Judith Part, MD, 5 mg at 02/07/20 0626 .  polyethylene glycol (MIRALAX / GLYCOLAX) packet 17 g, 17 g, Oral, Daily PRN, Jujuan Dugo A, MD .  promethazine (PHENERGAN) tablet 12.5-25 mg, 12.5-25 mg, Oral, Q4H PRN, Judith Part, MD, 25 mg at 02/04/20 1711   Physical Exam: AOx3, PERRL, EOMI, FS, Strength 5/5 x4, SILTx4, no drift  Assessment & Plan: 39 y.o. woman s/p resection of brain tumor, recovering well. Post-op MRI with GTR  -final path pending -SCDs/TEDs, SQH -PT/OT rec outpt PT, no OT f/u -possible discharge this afternoon, will get everything ready if she feels  up to it, otherwise discharge home tomorrow  Judith Part  02/07/20 7:39 AM

## 2020-02-07 NOTE — Progress Notes (Signed)
Pt given D/C education and all questions answered. No printed prescriptions to give or equipment to deliver. IV removed. Pt taken to car with all belongings.

## 2020-02-07 NOTE — Discharge Summary (Signed)
Discharge Summary  Date of Admission: 02/04/2020  Date of Discharge: 02/07/20  Attending Physician: Emelda Brothers, MD  Hospital Course: Patient was admitted following an uncomplicated craniotomy for tumor resection. She was recovered in PACU and transferred to 4N. Post-op MRI showed gross total resection. Her hospital course was uncomplicated and the patient was discharged home on 02/07/20. She will follow up in clinic with me in 2 weeks. Final pathology pending at the time of discharge.   Neurologic exam at discharge:  AOx3, PERRL, EOMI, FS, TM Strength 5/5 x4, SILTx4, no drift  Discharge diagnosis: Brain tumor  Judith Part, MD 02/07/20 7:44 AM

## 2020-02-07 NOTE — Discharge Instructions (Signed)
Discharge Instructions  No restriction in activities, slowly increase your activity back to normal.   Your incision is closed with absorbable sutures. These will naturally fall off over the next 4-6 weeks. If they become bothersome or cause discomfort, apply some antibiotic ointment like bacitracin or neosporin on the sutures. This will soften them up and usually makes them more comfortable while they dissolve.  Okay to shower on the day of discharge. Be gentle when cleaning your incision. Use regular soap and water. If that is uncomfortable, try using baby shampoo. Do not submerge the wound under water for 2 weeks after surgery.  Follow up with Dr. Mikolaj Woolstenhulme in 2 weeks after discharge. If you do not already have a discharge appointment, please call his office at [phone] to schedule a follow up appointment. If you have any concerns or questions, please call the office and let us know. 

## 2020-02-10 ENCOUNTER — Inpatient Hospital Stay: Payer: 59 | Attending: Internal Medicine

## 2020-02-10 DIAGNOSIS — C719 Malignant neoplasm of brain, unspecified: Secondary | ICD-10-CM | POA: Insufficient documentation

## 2020-02-11 ENCOUNTER — Encounter (HOSPITAL_COMMUNITY): Payer: Medicaid Other | Admitting: Physical Therapy

## 2020-02-12 ENCOUNTER — Other Ambulatory Visit: Payer: Self-pay | Admitting: Radiation Therapy

## 2020-02-12 ENCOUNTER — Other Ambulatory Visit (HOSPITAL_COMMUNITY): Payer: Self-pay | Admitting: Neurological Surgery

## 2020-02-13 ENCOUNTER — Encounter (HOSPITAL_COMMUNITY): Payer: Medicaid Other | Admitting: Physical Therapy

## 2020-02-17 ENCOUNTER — Inpatient Hospital Stay: Payer: 59

## 2020-02-17 DIAGNOSIS — Z029 Encounter for administrative examinations, unspecified: Secondary | ICD-10-CM

## 2020-02-20 ENCOUNTER — Other Ambulatory Visit: Payer: Self-pay | Admitting: Radiation Therapy

## 2020-02-24 ENCOUNTER — Telehealth: Payer: Self-pay | Admitting: Radiation Therapy

## 2020-02-24 ENCOUNTER — Inpatient Hospital Stay: Payer: 59

## 2020-02-24 NOTE — Telephone Encounter (Signed)
Spoke with Maureen Key to inform her that we did discuss her case this morning in our brain and spine conference, but unfortunately the path results are not back from [place] yet. When we receive this information we will get back in touch with her for next steps.   Mont Dutton R.T.(R)(T) Radiation Special Procedures Navigator

## 2020-03-02 ENCOUNTER — Inpatient Hospital Stay: Payer: 59

## 2020-03-02 LAB — SURGICAL PATHOLOGY

## 2020-03-03 ENCOUNTER — Encounter (HOSPITAL_COMMUNITY): Payer: Self-pay

## 2020-03-04 ENCOUNTER — Other Ambulatory Visit: Payer: Self-pay | Admitting: Radiation Therapy

## 2020-03-05 ENCOUNTER — Other Ambulatory Visit: Payer: Self-pay

## 2020-03-05 ENCOUNTER — Inpatient Hospital Stay (HOSPITAL_BASED_OUTPATIENT_CLINIC_OR_DEPARTMENT_OTHER): Payer: 59 | Admitting: Internal Medicine

## 2020-03-05 VITALS — BP 122/79 | HR 69 | Temp 97.0°F | Resp 17 | Ht 62.0 in | Wt 105.4 lb

## 2020-03-05 DIAGNOSIS — C719 Malignant neoplasm of brain, unspecified: Secondary | ICD-10-CM

## 2020-03-05 NOTE — Progress Notes (Signed)
[facility] [place], [place] [address] [phone]   Interval Evaluation  Date of Service: 03/05/20 Patient Name: Maureen Key Patient MRN: [id number] Patient DOB: Jan 07, 1981 Provider: Ventura Sellers, MD  Identifying Statement:  Maureen Key is a 39 y.o. 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 Zada Finders.  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  IDH 1/2 Mutated.  1p/19q co-deleted  TERT Unknown   Interval History:  Maureen Key presents for follow up today after craniotomy, resection and finalization of path report from [place].  She describes no new or progressive neurologic deficits, no seizures or headaches.  Leg continues to heal following fixation surgery, now walking independently.  No problems with frontal incision/sutures.  H+P (12/20/19) Patient presented to medical attention after trauma, fall while roller skating and fracturing tibia one week ago.  This led to trauma evaluation and CNS imaging which demonstrated left frontal mass ~5cm.  She denies any neurologic symptoms, no headaches or seizures.  Currently in cast and wheelchair following tibial fixation over the weekend, healing well.   Medications: Current Outpatient Medications on File Prior to Visit  Medication Sig Dispense Refill  . ascorbic acid (VITAMIN C) 500 MG tablet Take 1 tablet (500 mg total) by mouth daily. 60 tablet 0  . Multiple Vitamin (MULTIVITAMIN WITH MINERALS) TABS tablet Take 1 tablet by mouth daily.    . Vitamin D, Ergocalciferol, (DRISDOL) 1.25 MG (50000 UNIT) CAPS capsule Take 1 capsule (50,000 Units total) by mouth every 7 (seven) days. 8 capsule 0   No current facility-administered  medications on file prior to visit.    Allergies: No Known Allergies Past Medical History:  Past Medical History:  Diagnosis Date  . Blood transfusion without reported diagnosis    last [place] delivery  . Dysmenorrhea   . History of placenta accreta in prior pregnancy, currently pregnant   . History of postpartum hemorrhage, currently pregnant   . History of sepsis   . Pregnancy induced hypertension   . Sickle cell trait ([place])   . Trichimoniasis   . Vaginal Pap smear, abnormal    Past Surgical History:  Past Surgical History:  Procedure Laterality Date  . ABDOMINAL HYSTERECTOMY  05/12/2017   Procedure: HYSTERECTOMY ABDOMINAL;  Surgeon: Jonnie Kind, MD;  Location: [facility];  Service: Obstetrics;;  . APPLICATION OF CRANIAL NAVIGATION N/A 02/04/2020   Procedure: APPLICATION OF CRANIAL NAVIGATION;  Surgeon: Judith Part, MD;  Location: [facility];  Service: Neurosurgery;  Laterality: N/A;  APPLICATION OF CRANIAL NAVIGATION  . CERVICAL CONE BIOPSY    . CESAREAN SECTION    . CESAREAN SECTION N/A 05/12/2017   Procedure: CESAREAN SECTION WITH Hysterectomy;  Surgeon: Jonnie Kind, MD;  Location: [facility];  Service: Obstetrics;  Laterality: N/A;  . CRANIOTOMY Left 02/04/2020   Procedure: LEFT CRANIOTOMY FOR TUMOR RESECTION;  Surgeon: Judith Part, MD;  Location: [facility];  Service: Neurosurgery;  Laterality: Left;  LEFT CRANIOTOMY FOR TUMOR RESECTION  . TIBIA IM NAIL INSERTION Right 12/13/2019   Procedure: INTRAMEDULLARY (IM) NAIL TIBIAL;  Surgeon: Shona Needles, MD;  Location: [facility];  Service: Orthopedics;  Laterality: Right;   Social History:  Social History   Socioeconomic History  . Marital  status: Single    Spouse name: Not on file  . Number of children: 4  . Years of education: Not on file  . Highest education level: Not on file  Occupational History  . Not on file  Tobacco Use  . Smoking status: Light Tobacco Smoker    Packs/day: 0.25    Last  attempt to quit: 03/08/2017    Years since quitting: 2.9  . Smokeless tobacco: Never Used  . Tobacco comment: 3 cigarettes a day  Vaping Use  . Vaping Use: Never used  Substance and Sexual Activity  . Alcohol use: No  . Drug use: Not Currently    Types: Marijuana    Comment: last used 04/09/17  . Sexual activity: Yes    Birth control/protection: None, Surgical  Other Topics Concern  . Not on file  Social History Narrative  . Not on file   Social Determinants of Health   Financial Resource Strain:   . Difficulty of Paying Living Expenses: Not on file  Food Insecurity:   . Worried About Charity fundraiser in the Last Year: Not on file  . Ran Out of Food in the Last Year: Not on file  Transportation Needs:   . Lack of Transportation (Medical): Not on file  . Lack of Transportation (Non-Medical): Not on file  Physical Activity:   . Days of Exercise per Week: Not on file  . Minutes of Exercise per Session: Not on file  Stress:   . Feeling of Stress : Not on file  Social Connections:   . Frequency of Communication with Friends and Family: Not on file  . Frequency of Social Gatherings with Friends and Family: Not on file  . Attends Religious Services: Not on file  . Active Member of Clubs or Organizations: Not on file  . Attends Archivist Meetings: Not on file  . Marital Status: Not on file  Intimate Partner Violence:   . Fear of Current or Ex-Partner: Not on file  . Emotionally Abused: Not on file  . Physically Abused: Not on file  . Sexually Abused: Not on file   Family History:  Family History  Problem Relation Age of Onset  . Hypertension Mother   . HIV/AIDS Father   . Cancer Father        lung  . Diabetes Sister   . Diabetes Maternal Aunt   . Diabetes Maternal Uncle   . Cancer Paternal Aunt   . Cancer Paternal Uncle   . Diabetes Maternal Grandmother   . Alzheimer's disease Paternal Grandmother     Review of Systems: Constitutional: Doesn't report  fevers, chills or abnormal weight loss Eyes: Doesn't report blurriness of vision Ears, nose, mouth, throat, and face: Doesn't report sore throat Respiratory: Doesn't report cough, dyspnea or wheezes Cardiovascular: Doesn't report palpitation, chest discomfort  Gastrointestinal:  Doesn't report nausea, constipation, diarrhea GU: Doesn't report incontinence Skin: Doesn't report skin rashes Neurological: Per HPI Musculoskeletal: Doesn't report joint pain Behavioral/Psych: Doesn't report anxiety  Physical Exam: Vitals:   03/05/20 1015  BP: 122/79  Pulse: 69  Resp: 17  Temp: (!) 97 F (36.1 C)  SpO2: 100%   KPS: 90. General: Alert, cooperative, pleasant, in no acute distress Head: Normal EENT: No conjunctival injection or scleral icterus.  Lungs: Resp effort normal Cardiac: Regular rate Abdomen: Non-distended abdomen Skin: No rashes cyanosis or petechiae. Extremities: No clubbing or edema  Neurologic Exam: Mental Status: Awake, alert, attentive to examiner. Oriented to self and  environment. Language is fluent with intact comprehension.  Cranial Nerves: Visual acuity is grossly normal. Visual fields are full. Extra-ocular movements intact. No ptosis. Face is symmetric Motor: Tone and bulk are normal. Power is full in both arms and legs. Reflexes are symmetric, no pathologic reflexes present.  Sensory: Intact to light touch Gait: Orthopedic limitation only  Labs: I have reviewed the data as listed    Component Value Date/Time   NA 136 01/31/2020 1340   NA 143 01/03/2018 0949   K 3.6 01/31/2020 1340   CL 101 01/31/2020 1340   CO2 27 01/31/2020 1340   GLUCOSE 82 01/31/2020 1340   BUN 9 01/31/2020 1340   BUN 14 01/03/2018 0949   CREATININE 0.67 01/31/2020 1340   CALCIUM 9.6 01/31/2020 1340   PROT 7.6 12/12/2019 2340   PROT 7.6 01/03/2018 0949   ALBUMIN 4.5 12/12/2019 2340   ALBUMIN 4.8 01/03/2018 0949   AST 20 12/12/2019 2340   ALT 22 12/12/2019 2340   ALKPHOS 72  12/12/2019 2340   BILITOT 0.2 (L) 12/12/2019 2340   BILITOT 0.4 01/03/2018 0949   GFRNONAA >60 01/31/2020 1340   GFRAA >60 01/31/2020 1340   Lab Results  Component Value Date   WBC 5.2 01/31/2020   NEUTROABS 6.0 12/02/2018   HGB 12.6 01/31/2020   HCT 37.2 01/31/2020   MCV 96.9 01/31/2020   PLT 262 01/31/2020    Imaging:  MR BRAIN W WO CONTRAST  Result Date: 02/05/2020 CLINICAL DATA:  Follow-up examination status post craniotomy for tumor resection. EXAM: MRI HEAD WITHOUT AND WITH CONTRAST TECHNIQUE: Multiplanar, multiecho pulse sequences of the brain and surrounding structures were obtained without and with intravenous contrast. CONTRAST:  4.11m GADAVIST GADOBUTROL 1 MMOL/ML IV SOLN COMPARISON:  Prior brain MRI from 01/28/2020. FINDINGS: Brain: Postoperative changes from interval left frontal craniotomy for resection of previously identified left frontal lobe mass are seen. Small extra-axial collection measuring up to 5 mm seen overlying the anterior left frontal convexity, immediately inferior and subjacent to the craniotomy bone flap. Superimposed postoperative pneumocephalus overlies both frontal convexities anteriorly. Air-fluid level with postoperative blood products seen within the resection cavity at the anterior left frontal lobe. Small region of restricted diffusion along the posterior and lateral aspect of the resection cavity consistent with a small Peri resection cavity infarct (series 5, image 84). No other unexpected complication. Following contrast administration, few curvilinear foci of enhancement seen along the inferior and posterior aspect of the resection cavity, favored to be postoperative and/or vascular in nature (series 23, image 36). No imaging findings to suggest residual tumor on this immediate postoperative exam. Elsewhere, remainder the brain remains normal in appearance. No other evidence for acute or subacute infarct. No other mass lesion or abnormal enhancement. No  other acute or chronic blood products. No hydrocephalus. Vascular: Major intracranial vascular flow voids are maintained. Skull and upper cervical spine: Craniocervical junction within normal limits. Bone marrow signal intensity normal. Postoperative changes from interval left frontal craniotomy. Sinuses/Orbits: Globes and orbital soft tissues within normal limits. Mild mucosal thickening noted within the sphenoid sinuses. Paranasal sinuses are otherwise clear. No mastoid effusion. Inner ear structures grossly normal. Other: None. IMPRESSION: 1. Postoperative changes from interval left frontal craniotomy for resection of left frontal lobe tumor. No imaging findings to suggest residual tumor on this immediate postoperative exam. 2. Small amount of restricted diffusion along the posterolateral margin of the resection cavity, consistent with a small adjacent infarct. Electronically Signed   By: BPincus BadderD.  On: 02/05/2020 04:06    Pathology:  Clinical History: Brain tumor (cm)   FINAL MICROSCOPIC DIAGNOSIS:   A. BRAIN TUMOR, LEFT FRONTAL, BIOPSY:  - Oligodendroglioma, IDH-mutant and 1p/19q-codeleted, CNS WHO grade 3.   B. BRAIN TUMOR, LEFT FRONTAL, RESECTION:  - Oligodendroglioma, IDH-mutant and 1p/19q-codeleted, CNS WHO grade 3.   COMMENT:   The above diagnosis utilizes the 2021 [facility] classification of tumors of  the central nervous system. Using the prior 2016 criteria, this tumor  would be referred to as an "anaplastic oligodendroglioma, IDH-mutant and  1p/19q-codeleted, WHO grade III".   This case was seen in outside consultation at [facility] [phone]). The diagnosis and comment are reported here  verbatim. Please see outside pathology report for complete details.  Ancillary studies were performed and interpreted at [facility].    Assessment/Plan Oligodendroglioma, IDH gene mutant and 1p/19q-codeleted ([place])   Chella A Reihl is clinically stable  today following successful craniotomy, gross total resection of grade III co-deleted oligodendroglioma 1 month ago.    We had an extensive conversation with her regarding pathology, prognosis, and available treatment pathways.    We provided the following options: -Deferring radiation and chemotherapy given favorable genetic profile, gross total resection, lack of symtpoms, incidental presentation, lack of comorbidity, young age. -Moving forward with RT/TMZ given grade III features on histology; tumor would have previously been classified as an anaplastic oligodendroglioma.  She will discuss this with her family and reach out to [place] with a decision, or further clinical discussion if needed.  If no up-front treatment preferred, would recommend repeating MRI brain in 2 months.  Screening for potential clinical trials was performed and discussed using eligibility criteria for active protocols at [facility], loco-regional tertiary centers, as well as national database available on [email or web address].    The patient is not a candidate for a research protocol at this time due to no suitable study identified.   All questions were answered. The patient knows to call the clinic with any problems, questions or concerns. No barriers to learning were detected.  The total time spent in the encounter was 40 minutes and more than 50% was on counseling and review of test results   Ventura Sellers, MD Medical Director of Neuro-Oncology [facility] at [facility] 03/05/20 3:29 PM

## 2020-03-09 ENCOUNTER — Inpatient Hospital Stay: Payer: 59

## 2020-04-09 ENCOUNTER — Inpatient Hospital Stay: Payer: 59 | Attending: Internal Medicine | Admitting: Internal Medicine

## 2020-04-09 DIAGNOSIS — C719 Malignant neoplasm of brain, unspecified: Secondary | ICD-10-CM

## 2020-04-09 NOTE — Progress Notes (Signed)
I connected with Maureen Key on 04/09/20 at 11:30 AM EDT by telephone visit and verified that I am speaking with the correct person using two identifiers.  I discussed the limitations, risks, security and privacy concerns of performing an evaluation and management service by telemedicine and the availability of in-person appointments. I also discussed with the patient that there may be a patient responsible charge related to this service. The patient expressed understanding and agreed to proceed.  Other persons participating in the visit and their role in the encounter:  n/a  Patient's location:  Home  Provider's location:  Office  Chief Complaint:  Oligodendroglioma, IDH gene mutant and 1p/19q-codeleted ([place]) - Plan: MR BRAIN W WO CONTRAST  History of Present Ilness: Maureen Key describes no new or progressive neurologic deficits.  No seizures or headaches.  Continues fully functional, caring for her kids. Observations: Language and cognition normal Assessment and Plan: Oligodendroglioma, IDH gene mutant and 1p/19q-codeleted ([place]) - Plan: MR BRAIN W WO CONTRAST We again discussed treatment options for her glioma, including radiation and temodar vs watch and wait with MRI scans.  At this time she prefers close monitoring with imaging given toxicity concerns regarding radiation. Follow Up Instructions: We ask that [place] return to clinic in 2 months following next brain MRI, or sooner as needed.  I discussed the assessment and treatment plan with the patient.  The patient was provided an opportunity to ask questions and all were answered.  The patient agreed with the plan and demonstrated understanding of the instructions.    The patient was advised to call back or seek an in-person evaluation if the symptoms worsen or if the condition fails to improve as anticipated.  I provided 5-10 minutes of non-face-to-face time during this enocunter.  Ventura Sellers,  MD   I provided 15 minutes of non face-to-face telephone visit time during this encounter, and > 50% was spent counseling as documented under my assessment & plan.

## 2020-04-10 ENCOUNTER — Telehealth: Payer: Self-pay | Admitting: Internal Medicine

## 2020-04-10 NOTE — Telephone Encounter (Signed)
Scheduled per 9/30 los. Pt is aware of appt time and date.

## 2020-05-12 ENCOUNTER — Other Ambulatory Visit: Payer: Self-pay | Admitting: Radiation Therapy

## 2020-05-15 ENCOUNTER — Other Ambulatory Visit: Payer: Self-pay

## 2020-05-15 ENCOUNTER — Ambulatory Visit (HOSPITAL_COMMUNITY)
Admission: RE | Admit: 2020-05-15 | Discharge: 2020-05-15 | Disposition: A | Discharge status: Home / Self Care | Payer: 59 | Source: Ambulatory Visit | Attending: Internal Medicine | Admitting: Internal Medicine

## 2020-05-15 DIAGNOSIS — C719 Malignant neoplasm of brain, unspecified: Secondary | ICD-10-CM | POA: Insufficient documentation

## 2020-05-15 MED ORDER — GADOBUTROL 1 MMOL/ML IV SOLN
5.0000 mL | Freq: Once | INTRAVENOUS | Status: AC | PRN
  Administered 2020-05-15: 5 mL via INTRAVENOUS

## 2020-05-18 ENCOUNTER — Inpatient Hospital Stay: Payer: 59 | Attending: Internal Medicine

## 2020-05-18 DIAGNOSIS — C719 Malignant neoplasm of brain, unspecified: Secondary | ICD-10-CM | POA: Insufficient documentation

## 2020-05-18 DIAGNOSIS — F17210 Nicotine dependence, cigarettes, uncomplicated: Secondary | ICD-10-CM | POA: Insufficient documentation

## 2020-05-18 DIAGNOSIS — D573 Sickle-cell trait: Secondary | ICD-10-CM | POA: Insufficient documentation

## 2020-05-19 ENCOUNTER — Inpatient Hospital Stay (HOSPITAL_BASED_OUTPATIENT_CLINIC_OR_DEPARTMENT_OTHER): Payer: 59 | Admitting: Internal Medicine

## 2020-05-19 ENCOUNTER — Other Ambulatory Visit: Payer: Self-pay

## 2020-05-19 VITALS — BP 113/70 | HR 83 | Temp 97.7°F | Resp 18 | Ht 62.0 in | Wt 107.4 lb

## 2020-05-19 DIAGNOSIS — D573 Sickle-cell trait: Secondary | ICD-10-CM | POA: Diagnosis not present

## 2020-05-19 DIAGNOSIS — F17210 Nicotine dependence, cigarettes, uncomplicated: Secondary | ICD-10-CM | POA: Diagnosis not present

## 2020-05-19 DIAGNOSIS — C719 Malignant neoplasm of brain, unspecified: Secondary | ICD-10-CM

## 2020-05-19 NOTE — Progress Notes (Signed)
[facility] at [place], [place] [address] [phone]   Interval Evaluation  Date of Service: 05/19/20 Patient Name: Maureen Key Patient MRN: [id number] Patient DOB: [birth year] Provider: Ventura Sellers, MD  Identifying Statement:  Maureen Key is a 39 y.o. 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 Zada Finders.  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  IDH 1/2 Mutated.  1p/19q co-deleted  TERT Unknown   Interval History:  Maureen Key presents for follow up today after recent MRI brain.  She describes no new or progressive neurologic deficits, no seizures or headaches.  No further issues with her leg.  Maintaining activity level at home and work.  H+P (12/20/19) Patient presented to medical attention after trauma, fall while roller skating and fracturing tibia one week ago.  This led to trauma evaluation and CNS imaging which demonstrated left frontal mass ~5cm.  She denies any neurologic symptoms, no headaches or seizures.  Currently in cast and wheelchair following tibial fixation over the weekend, healing well.   Medications: Current Outpatient Medications on File Prior to Visit  Medication Sig Dispense Refill  . ascorbic acid (VITAMIN C) 500 MG tablet Take 1 tablet (500 mg total) by mouth daily. 60 tablet 0  . Multiple Vitamin (MULTIVITAMIN WITH MINERALS) TABS tablet Take 1 tablet by mouth daily.    . Vitamin D, Ergocalciferol, (DRISDOL) 1.25 MG (50000 UNIT) CAPS capsule Take 1 capsule (50,000 Units total) by mouth every 7 (seven) days. 8 capsule 0   No current facility-administered medications on file prior to visit.    Allergies: No Known Allergies Past Medical  History:  Past Medical History:  Diagnosis Date  . Blood transfusion without reported diagnosis    last [place] delivery  . Dysmenorrhea   . History of placenta accreta in prior pregnancy, currently pregnant   . History of postpartum hemorrhage, currently pregnant   . History of sepsis   . Pregnancy induced hypertension   . Sickle cell trait ([place])   . Trichimoniasis   . Vaginal Pap smear, abnormal    Past Surgical History:  Past Surgical History:  Procedure Laterality Date  . ABDOMINAL HYSTERECTOMY  05/12/2017   Procedure: HYSTERECTOMY ABDOMINAL;  Surgeon: Jonnie Kind, MD;  Location: [facility];  Service: Obstetrics;;  . APPLICATION OF CRANIAL NAVIGATION N/A 02/04/2020   Procedure: APPLICATION OF CRANIAL NAVIGATION;  Surgeon: Judith Part, MD;  Location: [facility];  Service: Neurosurgery;  Laterality: N/A;  APPLICATION OF CRANIAL NAVIGATION  . CERVICAL CONE BIOPSY    . CESAREAN SECTION    . CESAREAN SECTION N/A 05/12/2017   Procedure: CESAREAN SECTION WITH Hysterectomy;  Surgeon: Jonnie Kind, MD;  Location: [facility];  Service: Obstetrics;  Laterality: N/A;  . CRANIOTOMY Left 02/04/2020   Procedure: LEFT CRANIOTOMY FOR TUMOR RESECTION;  Surgeon: Judith Part, MD;  Location: [facility];  Service: Neurosurgery;  Laterality: Left;  LEFT CRANIOTOMY FOR TUMOR RESECTION  . TIBIA IM NAIL INSERTION Right 12/13/2019   Procedure: INTRAMEDULLARY (IM) NAIL TIBIAL;  Surgeon: Shona Needles, MD;  Location: [facility];  Service: Orthopedics;  Laterality: Right;   Social History:  Social History   Socioeconomic History  . Marital status: Single    Spouse name: Not  on file  . Number of children: 4  . Years of education: Not on file  . Highest education level: Not on file  Occupational History  . Not on file  Tobacco Use  . Smoking status: Light Tobacco Smoker    Packs/day: 0.25    Last attempt to quit: 03/08/2017    Years since quitting: 3.2  . Smokeless tobacco: Never  Used  . Tobacco comment: 3 cigarettes a day  Vaping Use  . Vaping Use: Never used  Substance and Sexual Activity  . Alcohol use: No  . Drug use: Not Currently    Types: Marijuana    Comment: last used 04/09/17  . Sexual activity: Yes    Birth control/protection: None, Surgical  Other Topics Concern  . Not on file  Social History Narrative  . Not on file   Social Determinants of Health   Financial Resource Strain:   . Difficulty of Paying Living Expenses: Not on file  Food Insecurity:   . Worried About Charity fundraiser in the Last Year: Not on file  . Ran Out of Food in the Last Year: Not on file  Transportation Needs:   . Lack of Transportation (Medical): Not on file  . Lack of Transportation (Non-Medical): Not on file  Physical Activity:   . Days of Exercise per Week: Not on file  . Minutes of Exercise per Session: Not on file  Stress:   . Feeling of Stress : Not on file  Social Connections:   . Frequency of Communication with Friends and Family: Not on file  . Frequency of Social Gatherings with Friends and Family: Not on file  . Attends Religious Services: Not on file  . Active Member of Clubs or Organizations: Not on file  . Attends Archivist Meetings: Not on file  . Marital Status: Not on file  Intimate Partner Violence:   . Fear of Current or Ex-Partner: Not on file  . Emotionally Abused: Not on file  . Physically Abused: Not on file  . Sexually Abused: Not on file   Family History:  Family History  Problem Relation Age of Onset  . Hypertension Mother   . HIV/AIDS Father   . Cancer Father        lung  . Diabetes Sister   . Diabetes Maternal Aunt   . Diabetes Maternal Uncle   . Cancer Paternal Aunt   . Cancer Paternal Uncle   . Diabetes Maternal Grandmother   . Alzheimer's disease Paternal Grandmother     Review of Systems: Constitutional: Doesn't report fevers, chills or abnormal weight loss Eyes: Doesn't report blurriness of  vision Ears, nose, mouth, throat, and face: Doesn't report sore throat Respiratory: Doesn't report cough, dyspnea or wheezes Cardiovascular: Doesn't report palpitation, chest discomfort  Gastrointestinal:  Doesn't report nausea, constipation, diarrhea GU: Doesn't report incontinence Skin: Doesn't report skin rashes Neurological: Per HPI Musculoskeletal: Doesn't report joint pain Behavioral/Psych: Doesn't report anxiety  Physical Exam: Vitals:   05/19/20 1019  BP: 113/70  Pulse: 83  Resp: 18  Temp: 97.7 F (36.5 C)  SpO2: 100%   KPS: 90. General: Alert, cooperative, pleasant, in no acute distress Head: Normal EENT: No conjunctival injection or scleral icterus.  Lungs: Resp effort normal Cardiac: Regular rate Abdomen: Non-distended abdomen Skin: No rashes cyanosis or petechiae. Extremities: No clubbing or edema  Neurologic Exam: Mental Status: Awake, alert, attentive to examiner. Oriented to self and environment. Language is fluent with intact comprehension.  Cranial  Nerves: Visual acuity is grossly normal. Visual fields are full. Extra-ocular movements intact. No ptosis. Face is symmetric Motor: Tone and bulk are normal. Power is full in both arms and legs. Reflexes are symmetric, no pathologic reflexes present.  Sensory: Intact to light touch Gait: Orthopedic limitation only  Labs: I have reviewed the data as listed    Component Value Date/Time   NA 136 01/31/2020 1340   NA 143 01/03/2018 0949   K 3.6 01/31/2020 1340   CL 101 01/31/2020 1340   CO2 27 01/31/2020 1340   GLUCOSE 82 01/31/2020 1340   BUN 9 01/31/2020 1340   BUN 14 01/03/2018 0949   CREATININE 0.67 01/31/2020 1340   CALCIUM 9.6 01/31/2020 1340   PROT 7.6 12/12/2019 2340   PROT 7.6 01/03/2018 0949   ALBUMIN 4.5 12/12/2019 2340   ALBUMIN 4.8 01/03/2018 0949   AST 20 12/12/2019 2340   ALT 22 12/12/2019 2340   ALKPHOS 72 12/12/2019 2340   BILITOT 0.2 (L) 12/12/2019 2340   BILITOT 0.4 01/03/2018 0949    GFRNONAA >60 01/31/2020 1340   GFRAA >60 01/31/2020 1340   Lab Results  Component Value Date   WBC 5.2 01/31/2020   NEUTROABS 6.0 12/02/2018   HGB 12.6 01/31/2020   HCT 37.2 01/31/2020   MCV 96.9 01/31/2020   PLT 262 01/31/2020    Imaging:  MR BRAIN W WO CONTRAST  Result Date: 05/16/2020 CLINICAL DATA:  Brain CNS neoplasm. Assess treatment response. Oligodendroglioma. Patient is asymptomatic. EXAM: MRI HEAD WITHOUT AND WITH CONTRAST TECHNIQUE: Multiplanar, multiecho pulse sequences of the brain and surrounding structures were obtained without and with intravenous contrast. CONTRAST:  70m GADAVIST GADOBUTROL 1 MMOL/ML IV SOLN COMPARISON:  MR head without and with contrast 02/05/2020 FINDINGS: Brain: Patient is status post left frontal craniotomy for resection of tumor. Remote blood products are present within the cavity. The cavity has decreased in size significantly since the prior exam. Diffusion signal in the posterior aspect of the resection cavity likely reflects blood products. Circumferential enhancement is now present in the resection cavity. No mass lesion is present. There is slight irregular enhancement along the posterior margin. No other mass lesion is present. No distal enhancement is present. Imaging the brain is otherwise unremarkable. No significant white matter disease is present. Ventricles are of normal size. Right hemisphere is normal. The internal auditory canals are within normal limits. The brainstem and cerebellum are within normal limits. Vascular: Flow is present in the major intracranial arteries. Skull and upper cervical spine: The craniocervical junction is normal. Upper cervical spine is within normal limits. Marrow signal is unremarkable. Sinuses/Orbits: The paranasal sinuses and mastoid air cells are clear. The globes and orbits are within normal limits. IMPRESSION: 1. Status post left frontal craniotomy for resection of tumor. There is continued retraction of the  surgical cavity without focal mass lesion. 2. Circumferential enhancement of the resection cavity with some irregularity along the posterior margin. Recommend attention to this area on follow-up exam. Electronically Signed   By: CSan MorelleM.D.   On: 05/16/2020 17:48     Assessment/Plan Oligodendroglioma, IDH gene mutant and 1p/19q-codeleted ([place]   Anniya A Raczka is clinically stable and radiographically stable today.  We recommended continuing serial MRI monitoring at this time, she is agreeable.  We ask that [place]return to clinic in 3 months following next brain MRI, or sooner as needed.  All questions were answered. The patient knows to call the clinic with any problems, questions  or concerns. No barriers to learning were detected.  I have spent a total of 30 minutes of face-to-face and non-face-to-face time, excluding clinical staff time, preparing to see patient, ordering tests and/or medications, counseling the patient, and independently interpreting results and communicating results to the patient/family/caregiver    Ventura Sellers, MD Medical Director of Neuro-Oncology [facility] at [place] 05/19/20 10:15 AM

## 2020-05-20 ENCOUNTER — Telehealth: Payer: Self-pay | Admitting: Internal Medicine

## 2020-05-20 NOTE — Telephone Encounter (Signed)
Scheduled per 11/9 los. Pt is aware of appt time and date.

## 2020-07-30 ENCOUNTER — Other Ambulatory Visit: Payer: 59

## 2020-08-06 ENCOUNTER — Other Ambulatory Visit: Payer: Self-pay | Admitting: Radiation Therapy

## 2020-08-13 ENCOUNTER — Telehealth: Payer: Self-pay

## 2020-08-13 NOTE — Telephone Encounter (Signed)
-----   Message from Burna Mortimer, [place] sent at 08/13/2020 12:40 PM EST ----- Regarding: FW: Cancelation Hey Bridie Colquhoun I am just now getting caught up with Vaslow's calls, can you please give this Patient a call to inform her about the below information? Thank you!! ----- Message ----- From: Elliot Gault Sent: 08/13/2020  12:10 PM EST To: Tami Lin, RN, April H Pait, # Subject: FW: Cancelation                                Good afternoon - can someone contact the patient to let her know that we are going to have to cancel the MRI for tomorrow?  Thank you  Brandi ----- Message ----- From: Elliot Gault Sent: 08/13/2020  11:36 AM EST To: April H Pait, Roosvelt Maser, # Subject: Cancelation                                    Good Morning - we are currently waiting on an auth for this patients MRI. Please cancel for tomorrow. We will re-schedule once we have the approval.  Thank you  Velna Hatchet

## 2020-08-13 NOTE — Telephone Encounter (Signed)
I spoke with pt and advised of her cancelled MRI tomorrow. She expressed understanding of this information.

## 2020-08-14 ENCOUNTER — Ambulatory Visit (HOSPITAL_COMMUNITY): Payer: 59

## 2020-08-17 ENCOUNTER — Inpatient Hospital Stay: Payer: 59 | Attending: Internal Medicine

## 2020-08-18 ENCOUNTER — Inpatient Hospital Stay: Payer: 59 | Admitting: Internal Medicine

## 2020-09-22 ENCOUNTER — Ambulatory Visit (HOSPITAL_COMMUNITY)
Admission: RE | Admit: 2020-09-22 | Discharge: 2020-09-22 | Disposition: A | Discharge status: Home / Self Care | Payer: 59 | Source: Ambulatory Visit | Attending: Internal Medicine | Admitting: Internal Medicine

## 2020-09-22 ENCOUNTER — Other Ambulatory Visit: Payer: Self-pay

## 2020-09-22 ENCOUNTER — Encounter: Payer: Self-pay | Admitting: Internal Medicine

## 2020-09-22 DIAGNOSIS — C719 Malignant neoplasm of brain, unspecified: Secondary | ICD-10-CM | POA: Insufficient documentation

## 2020-09-22 MED ORDER — GADOBUTROL 1 MMOL/ML IV SOLN
5.0000 mL | Freq: Once | INTRAVENOUS | Status: AC | PRN
  Administered 2020-09-22: 5 mL via INTRAVENOUS

## 2020-09-23 ENCOUNTER — Telehealth: Payer: Self-pay | Admitting: Internal Medicine

## 2020-09-23 NOTE — Telephone Encounter (Signed)
Scheduled appointment per 3/16 sch msg. Pt aware.

## 2020-10-01 ENCOUNTER — Other Ambulatory Visit: Payer: Self-pay

## 2020-10-01 ENCOUNTER — Inpatient Hospital Stay: Payer: 59 | Attending: Internal Medicine | Admitting: Internal Medicine

## 2020-10-01 VITALS — BP 107/76 | HR 75 | Temp 97.0°F | Resp 20 | Ht 62.0 in | Wt 106.5 lb

## 2020-10-01 DIAGNOSIS — C719 Malignant neoplasm of brain, unspecified: Secondary | ICD-10-CM | POA: Insufficient documentation

## 2020-10-01 NOTE — Progress Notes (Signed)
[facility] at [place], [place] [address] [phone]   Interval Evaluation  Date of Service: 10/01/20 Patient Name: Maureen Key Patient MRN: [id number] Patient DOB: Jan 26, 1981 Provider: Ventura Sellers, MD  Identifying Statement:  Maureen Key is a 40 y.o. 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 Zada Finders.  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  IDH 1/2 Mutated.  1p/19q co-deleted  TERT Unknown   Interval History:  Maureen Key presents for follow up today after recent MRI brain.  Denies new or progressive neurologic deficits today, aside from prior complaints regarding fatigue, short term memory.  No further issues with her leg.  Maintaining activity level at home and work.  H+P (12/20/19) Patient presented to medical attention after trauma, fall while roller skating and fracturing tibia one week ago.  This led to trauma evaluation and CNS imaging which demonstrated left frontal mass ~5cm.  She denies any neurologic symptoms, no headaches or seizures.  Currently in cast and wheelchair following tibial fixation over the weekend, healing well.   Medications: Current Outpatient Medications on File Prior to Visit  Medication Sig Dispense Refill  . Multiple Vitamin (MULTIVITAMIN WITH MINERALS) TABS tablet Take 1 tablet by mouth daily.    . Vitamin D, Ergocalciferol, (DRISDOL) 1.25 MG (50000 UNIT) CAPS capsule Take 1 capsule (50,000 Units total) by mouth every 7 (seven) days. 8 capsule 0   No current facility-administered medications on file prior to visit.    Allergies: No Known Allergies Past Medical History:  Past Medical History:  Diagnosis Date  . Blood transfusion  without reported diagnosis    last [place] delivery  . Dysmenorrhea   . History of placenta accreta in prior pregnancy, currently pregnant   . History of postpartum hemorrhage, currently pregnant   . History of sepsis   . Pregnancy induced hypertension   . Sickle cell trait ([place])   . Trichimoniasis   . Vaginal Pap smear, abnormal    Past Surgical History:  Past Surgical History:  Procedure Laterality Date  . ABDOMINAL HYSTERECTOMY  05/12/2017   Procedure: HYSTERECTOMY ABDOMINAL;  Surgeon: Jonnie Kind, MD;  Location: [facility];  Service: Obstetrics;;  . APPLICATION OF CRANIAL NAVIGATION N/A 02/04/2020   Procedure: APPLICATION OF CRANIAL NAVIGATION;  Surgeon: Judith Part, MD;  Location: [facility];  Service: Neurosurgery;  Laterality: N/A;  APPLICATION OF CRANIAL NAVIGATION  . CERVICAL CONE BIOPSY    . CESAREAN SECTION    . CESAREAN SECTION N/A 05/12/2017   Procedure: CESAREAN SECTION WITH Hysterectomy;  Surgeon: Jonnie Kind, MD;  Location: [facility];  Service: Obstetrics;  Laterality: N/A;  . CRANIOTOMY Left 02/04/2020   Procedure: LEFT CRANIOTOMY FOR TUMOR RESECTION;  Surgeon: Judith Part, MD;  Location: [facility];  Service: Neurosurgery;  Laterality: Left;  LEFT CRANIOTOMY FOR TUMOR RESECTION  . TIBIA IM NAIL INSERTION Right 12/13/2019   Procedure: INTRAMEDULLARY (IM) NAIL TIBIAL;  Surgeon: Shona Needles, MD;  Location: [facility];  Service: Orthopedics;  Laterality: Right;   Social History:  Social History   Socioeconomic History  . Marital status: Single    Spouse name: Not on file  . Number of children: 4  . Years of education: Not on file  .  Highest education level: Not on file  Occupational History  . Not on file  Tobacco Use  . Smoking status: Light Tobacco Smoker    Packs/day: 0.25    Last attempt to quit: 03/08/2017    Years since quitting: 3.5  . Smokeless tobacco: Never Used  . Tobacco comment: 3 cigarettes a day  Vaping Use  . Vaping Use:  Never used  Substance and Sexual Activity  . Alcohol use: No  . Drug use: Not Currently    Types: Marijuana    Comment: last used 04/09/17  . Sexual activity: Yes    Birth control/protection: None, Surgical  Other Topics Concern  . Not on file  Social History Narrative  . Not on file   Social Determinants of Health   Financial Resource Strain: Not on file  Food Insecurity: Not on file  Transportation Needs: Not on file  Physical Activity: Not on file  Stress: Not on file  Social Connections: Not on file  Intimate Partner Violence: Not on file   Family History:  Family History  Problem Relation Age of Onset  . Hypertension Mother   . HIV/AIDS Father   . Cancer Father        lung  . Diabetes Sister   . Diabetes Maternal Aunt   . Diabetes Maternal Uncle   . Cancer Paternal Aunt   . Cancer Paternal Uncle   . Diabetes Maternal Grandmother   . Alzheimer's disease Paternal Grandmother     Review of Systems: Constitutional: Doesn't report fevers, chills or abnormal weight loss Eyes: Doesn't report blurriness of vision Ears, nose, mouth, throat, and face: Doesn't report sore throat Respiratory: Doesn't report cough, dyspnea or wheezes Cardiovascular: Doesn't report palpitation, chest discomfort  Gastrointestinal:  Doesn't report nausea, constipation, diarrhea GU: Doesn't report incontinence Skin: Doesn't report skin rashes Neurological: Per HPI Musculoskeletal: Doesn't report joint pain Behavioral/Psych: Doesn't report anxiety  Physical Exam: Vitals:   10/01/20 1125  BP: 107/76  Pulse: 75  Resp: 20  Temp: (!) 97 F (36.1 C)  SpO2: 100%   KPS: 90. General: Alert, cooperative, pleasant, in no acute distress Head: Normal EENT: No conjunctival injection or scleral icterus.  Lungs: Resp effort normal Cardiac: Regular rate Abdomen: Non-distended abdomen Skin: No rashes cyanosis or petechiae. Extremities: No clubbing or edema  Neurologic Exam: Mental Status:  Awake, alert, attentive to examiner. Oriented to self and environment. Language is fluent with intact comprehension.  Cranial Nerves: Visual acuity is grossly normal. Visual fields are full. Extra-ocular movements intact. No ptosis. Face is symmetric Motor: Tone and bulk are normal. Power is full in both arms and legs. Reflexes are symmetric, no pathologic reflexes present.  Sensory: Intact to light touch Gait: Orthopedic limitation only  Labs: I have reviewed the data as listed    Component Value Date/Time   NA 136 01/31/2020 1340   NA 143 01/03/2018 0949   K 3.6 01/31/2020 1340   CL 101 01/31/2020 1340   CO2 27 01/31/2020 1340   GLUCOSE 82 01/31/2020 1340   BUN 9 01/31/2020 1340   BUN 14 01/03/2018 0949   CREATININE 0.67 01/31/2020 1340   CALCIUM 9.6 01/31/2020 1340   PROT 7.6 12/12/2019 2340   PROT 7.6 01/03/2018 0949   ALBUMIN 4.5 12/12/2019 2340   ALBUMIN 4.8 01/03/2018 0949   AST 20 12/12/2019 2340   ALT 22 12/12/2019 2340   ALKPHOS 72 12/12/2019 2340   BILITOT 0.2 (L) 12/12/2019 2340   BILITOT 0.4 01/03/2018 0949  GFRNONAA >60 01/31/2020 1340   GFRAA >60 01/31/2020 1340   Lab Results  Component Value Date   WBC 5.2 01/31/2020   NEUTROABS 6.0 12/02/2018   HGB 12.6 01/31/2020   HCT 37.2 01/31/2020   MCV 96.9 01/31/2020   PLT 262 01/31/2020    Imaging:  [facility] Clinician Interpretation: I have personally reviewed the CNS images as listed.  My interpretation, in the context of the patient's clinical presentation, is stable disease  MR BRAIN W WO CONTRAST  Result Date: 09/22/2020 CLINICAL DATA:  Brain/CNS neoplasm, assess treatment response. Oligodendroglioma, IDH gene mutant and 1p/19q-codeleted. EXAM: MRI HEAD WITHOUT AND WITH CONTRAST TECHNIQUE: Multiplanar, multiecho pulse sequences of the brain and surrounding structures were obtained without and with intravenous contrast. CONTRAST:  60m GADAVIST GADOBUTROL 1 MMOL/ML IV SOLN COMPARISON:  MRI of the brain May 15, 2020. FINDINGS: Brain: No acute infarct, hemorrhage, hydrocephalus or extra-axial collection. Postsurgical changes in the left frontal lobe with surgical cavity in the superior frontal gyrus. T2 hyperintensity surrounding the surgical cavity may represent gliosis versus residual tumor, unchanged from prior MRI. No parenchymal focus of abnormal contrast enhancement. Remainder of the brain parenchyma has normal signal characteristics. Mild dural thickening and contrast enhancement subjacent to the area of craniotomy is stable. Vascular: Normal flow voids. Skull and upper cervical spine: Postsurgical changes from left frontal craniotomy. Otherwise, marrow signal characteristics are maintained. Sinuses/Orbits: Mild diffuse mucosal thickening throughout the paranasal sinuses. The orbits are maintained. IMPRESSION: 1. Stable postsurgical changes from left frontal craniotomy with surgical cavity in the left superior frontal gyrus. T2 hyperintensity surrounding the surgical cavity may represent gliosis versus residual tumor, unchanged from prior MRI. No parenchymal focus of abnormal contrast enhancement. 2. Mild paranasal sinus disease. Electronically Signed   By: KPedro EarlsM.D.   On: 09/22/2020 15:37    Assessment/Plan Oligodendroglioma, IDH gene mutant and 1p/19q-codeleted ([place]   Maureen Key is clinically stable and radiographically stable today.   We reviewed impact of brain tumor, brain surgery on energy levels, cognition and mood today.  We recommended continuing serial MRI monitoring at this time.  We ask that CAut[place]areturn to clinic in 3 months following next brain MRI, or sooner as needed.  If stable next scan can move to 4 months.  All questions were answered. The patient knows to call the clinic with any problems, questions or concerns. No barriers to learning were detected.  I have spent a total of 30 minutes of face-to-face and non-face-to-face  time, excluding clinical staff time, preparing to see patient, ordering tests and/or medications, counseling the patient, and independently interpreting results and communicating results to the patient/family/caregiver    ZVentura Sellers MD Medical Director of Neuro-Oncology [facility] [place]03/24/22 11:22 AM

## 2020-10-12 ENCOUNTER — Other Ambulatory Visit: Payer: Self-pay | Admitting: *Deleted

## 2020-10-12 DIAGNOSIS — C719 Malignant neoplasm of brain, unspecified: Secondary | ICD-10-CM

## 2020-12-17 ENCOUNTER — Other Ambulatory Visit: Payer: Self-pay | Admitting: Radiation Therapy

## 2020-12-25 ENCOUNTER — Encounter (HOSPITAL_COMMUNITY): Payer: Self-pay

## 2020-12-25 ENCOUNTER — Ambulatory Visit (HOSPITAL_COMMUNITY): Payer: 59

## 2020-12-28 ENCOUNTER — Inpatient Hospital Stay: Payer: 59 | Attending: Internal Medicine

## 2020-12-30 ENCOUNTER — Other Ambulatory Visit: Payer: Self-pay | Admitting: *Deleted

## 2021-01-04 ENCOUNTER — Inpatient Hospital Stay: Payer: 59 | Admitting: Internal Medicine

## 2021-01-07 ENCOUNTER — Other Ambulatory Visit: Payer: Self-pay

## 2021-01-07 ENCOUNTER — Ambulatory Visit (HOSPITAL_COMMUNITY)
Admission: RE | Admit: 2021-01-07 | Discharge: 2021-01-07 | Disposition: A | Discharge status: Home / Self Care | Payer: Medicaid Other | Source: Ambulatory Visit | Attending: Internal Medicine | Admitting: Internal Medicine

## 2021-01-07 DIAGNOSIS — C719 Malignant neoplasm of brain, unspecified: Secondary | ICD-10-CM | POA: Insufficient documentation

## 2021-01-07 DIAGNOSIS — G9389 Other specified disorders of brain: Secondary | ICD-10-CM | POA: Diagnosis not present

## 2021-01-07 MED ORDER — GADOBUTROL 1 MMOL/ML IV SOLN
5.0000 mL | Freq: Once | INTRAVENOUS | Status: AC | PRN
  Administered 2021-01-07: 5 mL via INTRAVENOUS

## 2021-01-12 ENCOUNTER — Other Ambulatory Visit: Payer: Self-pay

## 2021-01-12 ENCOUNTER — Inpatient Hospital Stay: Payer: 59 | Attending: Internal Medicine | Admitting: Internal Medicine

## 2021-01-12 VITALS — BP 104/73 | HR 78 | Temp 98.4°F | Resp 18 | Ht 62.0 in | Wt 107.5 lb

## 2021-01-12 DIAGNOSIS — Z833 Family history of diabetes mellitus: Secondary | ICD-10-CM | POA: Diagnosis not present

## 2021-01-12 DIAGNOSIS — Z90710 Acquired absence of both cervix and uterus: Secondary | ICD-10-CM | POA: Insufficient documentation

## 2021-01-12 DIAGNOSIS — F17210 Nicotine dependence, cigarettes, uncomplicated: Secondary | ICD-10-CM | POA: Diagnosis not present

## 2021-01-12 DIAGNOSIS — Z801 Family history of malignant neoplasm of trachea, bronchus and lung: Secondary | ICD-10-CM | POA: Insufficient documentation

## 2021-01-12 DIAGNOSIS — D573 Sickle-cell trait: Secondary | ICD-10-CM | POA: Diagnosis not present

## 2021-01-12 DIAGNOSIS — C719 Malignant neoplasm of brain, unspecified: Secondary | ICD-10-CM | POA: Insufficient documentation

## 2021-01-12 DIAGNOSIS — Z8249 Family history of ischemic heart disease and other diseases of the circulatory system: Secondary | ICD-10-CM | POA: Diagnosis not present

## 2021-01-12 NOTE — Progress Notes (Signed)
[facility] [place], [place] [address] [phone]   Interval Evaluation  Date of Service: 01/12/21 Patient Name: Maureen Key Patient MRN: [id number] Patient DOB: 05-02-1981 Provider: Ventura Sellers, MD  Identifying Statement:  Maureen Key is a 40 y.o. 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 Zada Finders.  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  IDH 1/2 Mutated.  1p/19q co-deleted  TERT Unknown   Interval History:  Maureen Key presents for follow up today after recent MRI brain.  Does describe recent migraine headache, lasted for 2-3 days, occurred last week.  No further issues with her leg.  Maintaining activity level at home and work.  H+P (12/20/19) Patient presented to medical attention after trauma, fall while roller skating and fracturing tibia one week ago.  This led to trauma evaluation and CNS imaging which demonstrated left frontal mass ~5cm.  She denies any neurologic symptoms, no headaches or seizures.  Currently in cast and wheelchair following tibial fixation over the weekend, healing well.   Medications: Current Outpatient Medications on File Prior to Visit  Medication Sig Dispense Refill   Multiple Vitamin (MULTIVITAMIN WITH MINERALS) TABS tablet Take 1 tablet by mouth daily.     Vitamin D, Ergocalciferol, (DRISDOL) 1.25 MG (50000 UNIT) CAPS capsule Take 1 capsule (50,000 Units total) by mouth every 7 (seven) days. 8 capsule 0   No current facility-administered medications on file prior to visit.    Allergies: No Known Allergies Past Medical History:  Past Medical History:  Diagnosis Date   Blood transfusion without reported diagnosis    last PPH  delivery   Dysmenorrhea    History of placenta accreta in prior pregnancy, currently pregnant    History of postpartum hemorrhage, currently pregnant    History of sepsis    Pregnancy induced hypertension    Sickle cell trait ([place])    Trichimoniasis    Vaginal Pap smear, abnormal    Past Surgical History:  Past Surgical History:  Procedure Laterality Date   ABDOMINAL HYSTERECTOMY  05/12/2017   Procedure: HYSTERECTOMY ABDOMINAL;  Surgeon: Jonnie Kind, MD;  Location: [facility];  Service: Obstetrics;;   APPLICATION OF CRANIAL NAVIGATION N/A 02/04/2020   Procedure: APPLICATION OF CRANIAL NAVIGATION;  Surgeon: Judith Part, MD;  Location: [facility];  Service: Neurosurgery;  Laterality: N/A;  APPLICATION OF CRANIAL NAVIGATION   CERVICAL CONE BIOPSY     CESAREAN SECTION     CESAREAN SECTION N/A 05/12/2017   Procedure: CESAREAN SECTION WITH Hysterectomy;  Surgeon: Jonnie Kind, MD;  Location: [facility];  Service: Obstetrics;  Laterality: N/A;   CRANIOTOMY Left 02/04/2020   Procedure: LEFT CRANIOTOMY FOR TUMOR RESECTION;  Surgeon: Judith Part, MD;  Location: [facility];  Service: Neurosurgery;  Laterality: Left;  LEFT CRANIOTOMY FOR TUMOR RESECTION   TIBIA IM NAIL INSERTION Right 12/13/2019   Procedure: INTRAMEDULLARY (IM) NAIL TIBIAL;  Surgeon: Shona Needles, MD;  Location: [facility];  Service: Orthopedics;  Laterality: Right;   Social History:  Social History   Socioeconomic History   Marital status: Single    Spouse name: Not on file   Number of children: 4   Years of education: Not on file   Highest  education level: Not on file  Occupational History   Not on file  Tobacco Use   Smoking status: Light Smoker    Packs/day: 0.25    Pack years: 0.00    Types: Cigarettes    Last attempt to quit: 03/08/2017    Years since quitting: 3.8   Smokeless tobacco: Never   Tobacco comments:    3 cigarettes a day  Vaping Use   Vaping Use: Never used  Substance and  Sexual Activity   Alcohol use: No   Drug use: Not Currently    Types: Marijuana    Comment: last used 04/09/17   Sexual activity: Yes    Birth control/protection: None, Surgical  Other Topics Concern   Not on file  Social History Narrative   Not on file   Social Determinants of Health   Financial Resource Strain: Not on file  Food Insecurity: Not on file  Transportation Needs: Not on file  Physical Activity: Not on file  Stress: Not on file  Social Connections: Not on file  Intimate Partner Violence: Not on file   Family History:  Family History  Problem Relation Age of Onset   Hypertension Mother    HIV/AIDS Father    Cancer Father        lung   Diabetes Sister    Diabetes Maternal Aunt    Diabetes Maternal Uncle    Cancer Paternal Aunt    Cancer Paternal Uncle    Diabetes Maternal Grandmother    Alzheimer's disease Paternal Grandmother     Review of Systems: Constitutional: Doesn't report fevers, chills or abnormal weight loss Eyes: Doesn't report blurriness of vision Ears, nose, mouth, throat, and face: Doesn't report sore throat Respiratory: Doesn't report cough, dyspnea or wheezes Cardiovascular: Doesn't report palpitation, chest discomfort  Gastrointestinal:  Doesn't report nausea, constipation, diarrhea GU: Doesn't report incontinence Skin: Doesn't report skin rashes Neurological: Per HPI Musculoskeletal: Doesn't report joint pain Behavioral/Psych: Doesn't report anxiety  Physical Exam: Vitals:   01/12/21 1159  BP: 104/73  Pulse: 78  Resp: 18  Temp: 98.4 F (36.9 C)  SpO2: 100%   KPS: 90. General: Alert, cooperative, pleasant, in no acute distress Head: Normal EENT: No conjunctival injection or scleral icterus.  Lungs: Resp effort normal Cardiac: Regular rate Abdomen: Non-distended abdomen Skin: No rashes cyanosis or petechiae. Extremities: No clubbing or edema  Neurologic Exam: Mental Status: Awake, alert, attentive to examiner. Oriented  to self and environment. Language is fluent with intact comprehension.  Cranial Nerves: Visual acuity is grossly normal. Visual fields are full. Extra-ocular movements intact. No ptosis. Face is symmetric Motor: Tone and bulk are normal. Power is full in both arms and legs. Reflexes are symmetric, no pathologic reflexes present.  Sensory: Intact to light touch Gait: Orthopedic limitation only  Labs: I have reviewed the data as listed    Component Value Date/Time   NA 136 01/31/2020 1340   NA 143 01/03/2018 0949   K 3.6 01/31/2020 1340   CL 101 01/31/2020 1340   CO2 27 01/31/2020 1340   GLUCOSE 82 01/31/2020 1340   BUN 9 01/31/2020 1340   BUN 14 01/03/2018 0949   CREATININE 0.67 01/31/2020 1340   CALCIUM 9.6 01/31/2020 1340   PROT 7.6 12/12/2019 2340   PROT 7.6 01/03/2018 0949   ALBUMIN 4.5 12/12/2019 2340   ALBUMIN 4.8 01/03/2018 0949   AST 20 12/12/2019 2340   ALT 22 12/12/2019 2340   ALKPHOS 72 12/12/2019 2340   BILITOT 0.2 (  L) 12/12/2019 2340   BILITOT 0.4 01/03/2018 0949   GFRNONAA >60 01/31/2020 1340   GFRAA >60 01/31/2020 1340   Lab Results  Component Value Date   WBC 5.2 01/31/2020   NEUTROABS 6.0 12/02/2018   HGB 12.6 01/31/2020   HCT 37.2 01/31/2020   MCV 96.9 01/31/2020   PLT 262 01/31/2020    Imaging:  [facility] Clinician Interpretation: I have personally reviewed the CNS images as listed.  My interpretation, in the context of the patient's clinical presentation, is stable disease  MR Brain W Wo Contrast  Result Date: 01/07/2021 CLINICAL DATA:  Oligodendroglioma post resection, follow-up EXAM: MRI HEAD WITHOUT AND WITH CONTRAST TECHNIQUE: Multiplanar, multiecho pulse sequences of the brain and surrounding structures were obtained without and with intravenous contrast. CONTRAST:  53m GADAVIST GADOBUTROL 1 MMOL/ML IV SOLN COMPARISON:  Multiple priors.  Most recent 09/22/2020 FINDINGS: Brain: Postoperative changes of anterior left frontal lobe again identified with  superior frontal gyrus resection cavity and adjacent gliosis. There is stable postoperative enhancement in this region. The extent of surrounding T2 FLAIR hyperintensity is unchanged. There is no new abnormal enhancement. Mild ex vacuo dilatation of adjacent left lateral ventricle. There is no acute infarction or intracranial hemorrhage. Chronic blood products are present at the operative site. Vascular: Major vessel flow voids at the skull base are preserved. Skull and upper cervical spine: Left craniotomy. Normal marrow signal is preserved. Sinuses/Orbits: Minor mucosal thickening.  Orbits are unremarkable. Other: Sella is unremarkable.  Mastoid air cells are clear. IMPRESSION: Stable postoperative appearance. No evidence of progressive disease. Electronically Signed   By: PMacy MisM.D.   On: 01/07/2021 11:18     Assessment/Plan Oligodendroglioma, IDH gene mutant and 1p/19q-codeleted ([place]   Daphyne A Gwynn is clinically stable and radiographically stable today.   Recommended treating future migraines early in clinical course with 600-80102mIbuprofen.  If ineffective, can consider alternate agents.  We recommended continuing serial MRI monitoring at this time.  We ask that [place] clinic in 4 months following next brain MRI, or sooner as needed.   All questions were answered. The patient knows to call the clinic with any problems, questions or concerns. No barriers to learning were detected.  I have spent a total of 30 minutes of face-to-face and non-face-to-face time, excluding clinical staff time, preparing to see patient, ordering tests and/or medications, counseling the patient, and independently interpreting results and communicating results to the patient/family/caregiver    ZaVentura SellersMD Medical Director of Neuro-Oncology [facility] We[facility]7/05/22 12:01 PM

## 2021-01-18 ENCOUNTER — Inpatient Hospital Stay: Payer: 59

## 2021-04-02 DIAGNOSIS — Z87898 Personal history of other specified conditions: Secondary | ICD-10-CM | POA: Insufficient documentation

## 2021-05-04 DIAGNOSIS — R630 Anorexia: Secondary | ICD-10-CM | POA: Insufficient documentation

## 2021-05-04 DIAGNOSIS — R634 Abnormal weight loss: Secondary | ICD-10-CM | POA: Insufficient documentation

## 2021-05-04 DIAGNOSIS — D573 Sickle-cell trait: Secondary | ICD-10-CM | POA: Insufficient documentation

## 2021-05-06 ENCOUNTER — Other Ambulatory Visit: Payer: Self-pay | Admitting: Radiation Therapy

## 2021-05-13 ENCOUNTER — Ambulatory Visit (HOSPITAL_COMMUNITY)
Admission: RE | Admit: 2021-05-13 | Discharge: 2021-05-13 | Disposition: A | Discharge status: Home / Self Care | Payer: No Typology Code available for payment source | Source: Ambulatory Visit | Attending: Internal Medicine | Admitting: Internal Medicine

## 2021-05-13 ENCOUNTER — Other Ambulatory Visit: Payer: Self-pay

## 2021-05-13 DIAGNOSIS — C719 Malignant neoplasm of brain, unspecified: Secondary | ICD-10-CM

## 2021-05-16 ENCOUNTER — Emergency Department (HOSPITAL_COMMUNITY): Payer: No Typology Code available for payment source

## 2021-05-16 ENCOUNTER — Encounter (HOSPITAL_COMMUNITY): Payer: Self-pay | Admitting: *Deleted

## 2021-05-16 ENCOUNTER — Emergency Department (HOSPITAL_COMMUNITY)
Admission: EM | Admit: 2021-05-16 | Discharge: 2021-05-16 | Disposition: A | Discharge status: Home / Self Care | Payer: No Typology Code available for payment source | Attending: Emergency Medicine | Admitting: Emergency Medicine

## 2021-05-16 DIAGNOSIS — F17210 Nicotine dependence, cigarettes, uncomplicated: Secondary | ICD-10-CM | POA: Diagnosis not present

## 2021-05-16 DIAGNOSIS — X500XXA Overexertion from strenuous movement or load, initial encounter: Secondary | ICD-10-CM | POA: Diagnosis not present

## 2021-05-16 DIAGNOSIS — Y93E5 Activity, floor mopping and cleaning: Secondary | ICD-10-CM | POA: Insufficient documentation

## 2021-05-16 DIAGNOSIS — S39012A Strain of muscle, fascia and tendon of lower back, initial encounter: Secondary | ICD-10-CM | POA: Insufficient documentation

## 2021-05-16 DIAGNOSIS — S3992XA Unspecified injury of lower back, initial encounter: Secondary | ICD-10-CM | POA: Diagnosis present

## 2021-05-16 MED ORDER — CYCLOBENZAPRINE HCL 10 MG PO TABS: 10.0000 mg | ORAL_TABLET | Freq: Two times a day (BID) | ORAL | 0 refills | Status: DC | PRN

## 2021-05-16 MED ORDER — IBUPROFEN 600 MG PO TABS: 600.0000 mg | ORAL_TABLET | Freq: Four times a day (QID) | ORAL | 0 refills | Status: DC | PRN

## 2021-05-16 MED ORDER — IBUPROFEN 400 MG PO TABS
600.0000 mg | ORAL_TABLET | Freq: Once | ORAL | Status: AC
  Administered 2021-05-16: 600 mg via ORAL
  Filled 2021-05-16: qty 2

## 2021-05-16 NOTE — Discharge Instructions (Signed)
You have been evaluated for your pain.  Fortunately your x-ray did not show any concerning finding however it is important to follow-up closely with your doctor for further assessments of your back pain.  Take medication as prescribed.  Return if you have any concern.

## 2021-05-16 NOTE — ED Triage Notes (Signed)
States she bent over to empty trash and felt something pop in back

## 2021-05-16 NOTE — ED Provider Notes (Signed)
[facility] EMERGENCY DEPARTMENT Provider Note   CSN: [id number] Arrival date & time: 05/16/21  1041     History Chief Complaint  Patient presents with   Back Pain    Imberly A Kindig is a 40 y.o. female.  The history is provided by the patient. No language interpreter was used.  Back Pain Associated symptoms: no fever and no numbness    40 year old female who presents valuation of back pain.  Patient reports 2 days ago she was doing housework when she felt like she may have strained her lower back.  She took some Tylenol and it did help.  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  Pain is described as a sharp and tight sensation across her back worse with movement and improves with rest.  She also mentioned having history of oligodendroglioma and was voicing concern of her back pain.  She is scheduled to have an MRI of her brain in the next few days.  She denies any headache she denies any focal numbness or focal weakness.  Rates pain at this time a 7 out of 10.  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[place]) 02/04/2020   Brain mass 12/20/2019   Closed displaced spiral fracture of shaft of right tibia 12/13/2019   Alcohol abuse with intoxication ([place]) 12/13/2019   Closed fracture of right distal fibula 12/13/2019   Displaced comminuted fracture of shaft of right tibia, initial  encounter for closed fracture 12/12/2019   H/O abdominal supracervical subtotal hysterectomy 06/27/2017   Status post emergency cesarean hysterectomy 05/12/2017   History of acute respiratory failure 05/08/2017   History of postpartum hemorrhage 05/08/2017   Trichimoniasis 01/18/2017    Past Surgical History:  Procedure Laterality Date   ABDOMINAL HYSTERECTOMY  05/12/2017   Procedure: HYSTERECTOMY ABDOMINAL;  Surgeon: Jonnie Kind, MD;  Location: [facility];  Service: Obstetrics;;   APPLICATION OF CRANIAL NAVIGATION N/A 02/04/2020   Procedure: APPLICATION OF CRANIAL NAVIGATION;  Surgeon: Judith Part, MD;  Location: [facility];  Service: Neurosurgery;  Laterality: N/A;  APPLICATION OF CRANIAL NAVIGATION   CERVICAL CONE BIOPSY     CESAREAN SECTION     CESAREAN SECTION N/A 05/12/2017   Procedure: CESAREAN SECTION WITH Hysterectomy;  Surgeon: Jonnie Kind, MD;  Location: [facility];  Service: Obstetrics;  Laterality: N/A;   CRANIOTOMY Left 02/04/2020   Procedure: LEFT CRANIOTOMY FOR TUMOR RESECTION;  Surgeon: Judith Part, MD;  Location: [facility];  Service: Neurosurgery;  Laterality: Left;  LEFT CRANIOTOMY FOR TUMOR RESECTION   TIBIA IM NAIL INSERTION Right 12/13/2019   Procedure: INTRAMEDULLARY (IM) NAIL TIBIAL;  Surgeon: Shona Needles, MD;  Location: [facility];  Service: Orthopedics;  Laterality: Right;     OB History     Gravida  3   Para  3   Term  2   Preterm  1   AB      Living  3      SAB      IAB      Ectopic      Multiple  1   Live Births  3        Obstetric Comments  Mono/mono twins, scheduled         Family History  Problem Relation Age of Onset   Hypertension Mother    HIV/AIDS Father    Cancer Father        lung   Diabetes Sister    Diabetes Maternal Aunt    Diabetes Maternal Uncle    Cancer Paternal Aunt    Cancer Paternal Uncle    Diabetes Maternal Grandmother    Alzheimer's disease Paternal Grandmother      Social History   Tobacco Use   Smoking status: Light Smoker    Packs/day: 0.25    Types: Cigarettes    Last attempt to quit: 03/08/2017    Years since quitting: 4.1   Smokeless tobacco: Never   Tobacco comments:    3 cigarettes a day  Vaping Use   Vaping Use: Never used  Substance Use Topics   Alcohol use: No   Drug use: Not Currently    Types: Marijuana    Comment: last used 04/09/17    Home Medications Prior to Admission medications   Medication Sig Start Date End Date Taking? Authorizing Provider  Multiple Vitamin (MULTIVITAMIN WITH MINERALS) TABS tablet Take 1 tablet by mouth daily.    [provider]  Vitamin D, Ergocalciferol, (DRISDOL) 1.25 MG (50000 UNIT) CAPS capsule Take 1 capsule (50,000 Units total) by mouth every 7 (seven) days. 12/21/19   Ainsley Spinner, PA-C    Allergies    Patient has no known allergies.  Review of Systems   Review of Systems  Constitutional:  Negative for fever.  Musculoskeletal:  Positive for back pain.  Neurological:  Negative for numbness.   Physical Exam Updated Vital Signs BP 108/78   Pulse 96   Temp 98 F (36.7 C) (Oral)   Resp 16   LMP 08/05/2016   SpO2 100%   Physical Exam Vitals and nursing note reviewed.  Constitutional:      General: She is not in acute distress.    Appearance: She is well-developed.  HENT:     Head: Atraumatic.  Eyes:     Conjunctiva/sclera: Conjunctivae normal.  Pulmonary:     Effort: Pulmonary effort is normal.  Abdominal:     Palpations: Abdomen is soft.     Tenderness: There is no abdominal tenderness.  Musculoskeletal:        General: Tenderness (Mild tenderness to lumbar and paralumbar spinal muscle on palpation without crepitus or step-off noted.) present.     Cervical back: Neck supple.  Skin:    Findings: No rash.  Neurological:     Mental Status: She is alert.  Psychiatric:        Mood and Affect: Mood normal.    ED Results / Procedures / Treatments   Labs (all  labs ordered are listed, but only abnormal results are displayed) Labs Reviewed - No data to display  EKG None  Radiology DG Lumbar Spine Complete  Result Date: 05/16/2021 CLINICAL DATA:  Popping injury of the back while lifting. Low back pain EXAM: LUMBAR SPINE - COMPLETE 4+ VIEW COMPARISON:  12/12/2019 FINDINGS: There is no evidence  of lumbar spine fracture. Alignment is normal. Intervertebral disc spaces are maintained. IMPRESSION: Negative. Electronically Signed   By: Van Clines M.D.   On: 05/16/2021 12:11    Procedures Procedures   Medications Ordered in ED Medications  ibuprofen (ADVIL) tablet 600 mg (600 mg Oral Given 05/16/21 1252)    ED Course  I have reviewed the triage vital signs and the nursing notes.  Pertinent labs & imaging results that were available during my care of the patient were reviewed by me and considered in my medical decision making (see chart for details).    MDM Rules/Calculators/A&P                           BP 108/78   Pulse 96   Temp 98 F (36.7 C) (Oral)   Resp 16   LMP 08/05/2016   SpO2 100%   Final Clinical Impression(s) / ED Diagnoses Final diagnoses:  Strain of lumbar region, initial encounter    Rx / DC Orders ED Discharge Orders          Ordered    ibuprofen (ADVIL) 600 MG tablet  Every 6 hours PRN        05/16/21 1337    cyclobenzaprine (FLEXERIL) 10 MG tablet  2 times daily PRN        05/16/21 1337           Patient here with lower back pain while lifting earlier today at work.  This is likely MSK.  An x-ray of her lumbar spine without any acute finding.  Patient overall well-appearing no focal neurodeficit.  Will provide symptom control.     Domenic Moras, PA-C 05/16/21 1340    Milton Ferguson, MD 05/18/21 1625

## 2021-05-17 ENCOUNTER — Inpatient Hospital Stay: Payer: No Typology Code available for payment source

## 2021-05-18 ENCOUNTER — Other Ambulatory Visit: Payer: Self-pay

## 2021-05-18 ENCOUNTER — Inpatient Hospital Stay: Payer: No Typology Code available for payment source | Attending: Internal Medicine | Admitting: Internal Medicine

## 2021-05-18 ENCOUNTER — Ambulatory Visit (HOSPITAL_COMMUNITY)
Admission: RE | Admit: 2021-05-18 | Discharge: 2021-05-18 | Disposition: A | Discharge status: Home / Self Care | Payer: No Typology Code available for payment source | Source: Ambulatory Visit | Attending: Internal Medicine | Admitting: Internal Medicine

## 2021-05-18 VITALS — BP 110/79 | HR 90 | Temp 97.3°F | Resp 20 | Wt 114.6 lb

## 2021-05-18 DIAGNOSIS — C719 Malignant neoplasm of brain, unspecified: Secondary | ICD-10-CM | POA: Insufficient documentation

## 2021-05-18 MED ORDER — GADOBUTROL 1 MMOL/ML IV SOLN
5.0000 mL | Freq: Once | INTRAVENOUS | Status: AC | PRN
  Administered 2021-05-18: 5 mL via INTRAVENOUS

## 2021-05-18 NOTE — Progress Notes (Signed)
[place] at [place],  [phone]   Interval Evaluation  Date of Service: 05/18/21 Patient Name: Maureen Key Patient MRN: [id number] Patient DOB: 1980-11-01 Provider: Ventura Sellers, MD  Identifying Statement:  Maureen Key is a 40 y.o. 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 Zada Finders.  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  IDH 1/2 Mutated.  1p/19q co-deleted  TERT Unknown   Interval History:  Nolie A Guilford presents for follow up today after recent MRI brain.  Denies new or progressive neurologic deficits today.  No further issues with her leg.  Maintaining activity level at home and work, did have a lower back injury which has limited her at work.  H+P (12/20/19) Patient presented to medical attention after trauma, fall while roller skating and fracturing tibia one week ago.  This led to trauma evaluation and CNS imaging which demonstrated left frontal mass ~5cm.  She denies any neurologic symptoms, no headaches or seizures.  Currently in cast and wheelchair following tibial fixation over the weekend, healing well.   Medications: Current Outpatient Medications on File Prior to Visit  Medication Sig Dispense Refill   cyclobenzaprine (FLEXERIL) 10 MG tablet Take 1 tablet (10 mg total) by mouth 2 (two) times daily as needed for muscle spasms. 20 tablet 0   ibuprofen (ADVIL) 600 MG tablet Take 1 tablet (600 mg total) by mouth every 6 (six) hours as needed. 30 tablet 0   Multiple Vitamin (MULTIVITAMIN WITH MINERALS) TABS tablet Take 1 tablet by mouth daily.     Vitamin D, Ergocalciferol, (DRISDOL) 1.25 MG (50000 UNIT) CAPS capsule Take 1 capsule (50,000  Units total) by mouth every 7 (seven) days. 8 capsule 0   No current facility-administered medications on file prior to visit.    Allergies: No Known Allergies Past Medical History:  Past Medical History:  Diagnosis Date   Blood transfusion without reported diagnosis    last PPH delivery   Dysmenorrhea    History of placenta accreta in prior pregnancy, currently pregnant    History of postpartum hemorrhage, currently pregnant    History of sepsis    Pregnancy induced hypertension    Sickle cell trait ([place])    Trichimoniasis    Vaginal Pap smear, abnormal    Past Surgical History:  Past Surgical History:  Procedure Laterality Date   ABDOMINAL HYSTERECTOMY  05/12/2017   Procedure: HYSTERECTOMY ABDOMINAL;  Surgeon: Jonnie Kind, MD;  Location: [place];  Service: Obstetrics;;   APPLICATION OF CRANIAL NAVIGATION N/A 02/04/2020   Procedure: APPLICATION OF CRANIAL NAVIGATION;  Surgeon: Judith Part, MD;  Location: [place];  Service: Neurosurgery;  Laterality: N/A;  APPLICATION OF CRANIAL NAVIGATION   CERVICAL CONE BIOPSY     CESAREAN SECTION     CESAREAN SECTION N/A 05/12/2017   Procedure: CESAREAN SECTION WITH Hysterectomy;  Surgeon: Jonnie Kind, MD;  Location: [place];  Service: Obstetrics;  Laterality: N/A;   CRANIOTOMY Left 02/04/2020   Procedure: LEFT CRANIOTOMY FOR TUMOR RESECTION;  Surgeon: Judith Part, MD;  Location: [place];  Service: Neurosurgery;  Laterality: Left;  LEFT CRANIOTOMY FOR TUMOR RESECTION   TIBIA IM NAIL INSERTION Right 12/13/2019   Procedure: INTRAMEDULLARY (IM) NAIL  TIBIAL;  Surgeon: Shona Needles, MD;  Location: [place];  Service: Orthopedics;  Laterality: Right;   Social History:  Social History   Socioeconomic History   Marital status: Single    Spouse name: Not on file   Number of children: 4   Years of education: Not on file   Highest education level: Not on file  Occupational History   Not on file  Tobacco Use    Smoking status: Light Smoker    Packs/day: 0.25    Types: Cigarettes    Last attempt to quit: 03/08/2017    Years since quitting: 4.1   Smokeless tobacco: Never   Tobacco comments:    3 cigarettes a day  Vaping Use   Vaping Use: Never used  Substance and Sexual Activity   Alcohol use: No   Drug use: Not Currently    Types: Marijuana    Comment: last used 04/09/17   Sexual activity: Yes    Birth control/protection: None, Surgical  Other Topics Concern   Not on file  Social History Narrative   Not on file   Social Determinants of Health   Financial Resource Strain: Not on file  Food Insecurity: Not on file  Transportation Needs: Not on file  Physical Activity: Not on file  Stress: Not on file  Social Connections: Not on file  Intimate Partner Violence: Not on file   Family History:  Family History  Problem Relation Age of Onset   Hypertension Mother    HIV/AIDS Father    Cancer Father        lung   Diabetes Sister    Diabetes Maternal Aunt    Diabetes Maternal Uncle    Cancer Paternal Aunt    Cancer Paternal Uncle    Diabetes Maternal Grandmother    Alzheimer's disease Paternal Grandmother     Review of Systems: Constitutional: Doesn't report fevers, chills or abnormal weight loss Eyes: Doesn't report blurriness of vision Ears, nose, mouth, throat, and face: Doesn't report sore throat Respiratory: Doesn't report cough, dyspnea or wheezes Cardiovascular: Doesn't report palpitation, chest discomfort  Gastrointestinal:  Doesn't report nausea, constipation, diarrhea GU: Doesn't report incontinence Skin: Doesn't report skin rashes Neurological: Per HPI Musculoskeletal: Doesn't report joint pain Behavioral/Psych: Doesn't report anxiety  Physical Exam: Vitals:   05/18/21 1055  BP: 110/79  Pulse: 90  Resp: 20  Temp: (!) 97.3 F (36.3 C)  SpO2: 100%    KPS: 90. General: Alert, cooperative, pleasant, in no acute distress Head: Normal EENT: No  conjunctival injection or scleral icterus.  Lungs: Resp effort normal Cardiac: Regular rate Abdomen: Non-distended abdomen Skin: No rashes cyanosis or petechiae. Extremities: No clubbing or edema  Neurologic Exam: Mental Status: Awake, alert, attentive to examiner. Oriented to self and environment. Language is fluent with intact comprehension.  Cranial Nerves: Visual acuity is grossly normal. Visual fields are full. Extra-ocular movements intact. No ptosis. Face is symmetric Motor: Tone and bulk are normal. Power is full in both arms and legs. Reflexes are symmetric, no pathologic reflexes present.  Sensory: Intact to light touch Gait: Orthopedic limitation only  Labs: I have reviewed the data as listed    Component Value Date/Time   NA 136 01/31/2020 1340   NA 143 01/03/2018 0949   K 3.6 01/31/2020 1340   CL 101 01/31/2020 1340   CO2 27 01/31/2020 1340   GLUCOSE 82 01/31/2020 1340   BUN 9 01/31/2020 1340   BUN 14 01/03/2018 0949   CREATININE 0.67  01/31/2020 1340   CALCIUM 9.6 01/31/2020 1340   PROT 7.6 12/12/2019 2340   PROT 7.6 01/03/2018 0949   ALBUMIN 4.5 12/12/2019 2340   ALBUMIN 4.8 01/03/2018 0949   AST 20 12/12/2019 2340   ALT 22 12/12/2019 2340   ALKPHOS 72 12/12/2019 2340   BILITOT 0.2 (L) 12/12/2019 2340   BILITOT 0.4 01/03/2018 0949   GFRNONAA >60 01/31/2020 1340   GFRAA >60 01/31/2020 1340   Lab Results  Component Value Date   WBC 5.2 01/31/2020   NEUTROABS 6.0 12/02/2018   HGB 12.6 01/31/2020   HCT 37.2 01/31/2020   MCV 96.9 01/31/2020   PLT 262 01/31/2020    Imaging:  [place] Clinician Interpretation: I have personally reviewed the CNS images as listed.  My interpretation, in the context of the patient's clinical presentation, is stable disease, pending official read  DG Lumbar Spine Complete  Result Date: 05/16/2021 CLINICAL DATA:  Popping injury of the back while lifting. Low back pain EXAM: LUMBAR SPINE - COMPLETE 4+ VIEW COMPARISON:  12/12/2019  FINDINGS: There is no evidence of lumbar spine fracture. Alignment is normal. Intervertebral disc spaces are maintained. IMPRESSION: Negative. Electronically Signed   By: Van Clines M.D.   On: 05/16/2021 12:11     Assessment/Plan Oligodendroglioma, IDH gene mutant and 1p/19q-codeleted ([place])  Tyrone A Boeve is clinically stable and radiographically stable today.  There is modest growth of T2/FLAIR signal, however, when compared to MRI from November 2021.  We will await official read from radiology and discuss further in brain/spine tumor board meeting next week.    Ok with 600-878m Ibuprofen for breakthrough headaches.    We recommended continuing serial MRI monitoring at this time.  We ask that CGlorietareturn to clinic in 4 months following next brain MRI, or sooner as needed.   All questions were answered. The patient knows to call the clinic with any problems, questions or concerns. No barriers to learning were detected.  I have spent a total of 30 minutes of face-to-face and non-face-to-face time, excluding clinical staff time, preparing to see patient, ordering tests and/or medications, counseling the patient, and independently interpreting results and communicating results to the patient/family/caregiver    ZVentura Sellers MD Medical Director of Neuro-Oncology [facility] [place]11/08/22 10:58 AM

## 2021-05-24 ENCOUNTER — Inpatient Hospital Stay: Payer: No Typology Code available for payment source

## 2021-07-22 IMAGING — MR MR HEAD WO/W CM
15 of 16 series · 29 of 48 positions shown · IV contrast (gadavist)
Comparison: Brain MRI 01/01/2020

CLINICAL DATA: Brain tumor.

EXAM:
MRI HEAD WITHOUT AND WITH CONTRAST
TECHNIQUE: Multiplanar, multiecho pulse sequences of the brain and surrounding
structures were obtained without and with intravenous contrast.
CONTRAST:  5.5mL GADAVIST GADOBUTROL 1 MMOL/ML IV SOLN

[Series 2: FLAIR · sagittal · 3.0mm · 0.47mm/px · 1 of 40 slices shown (1 of 2)]
[im 1/40]
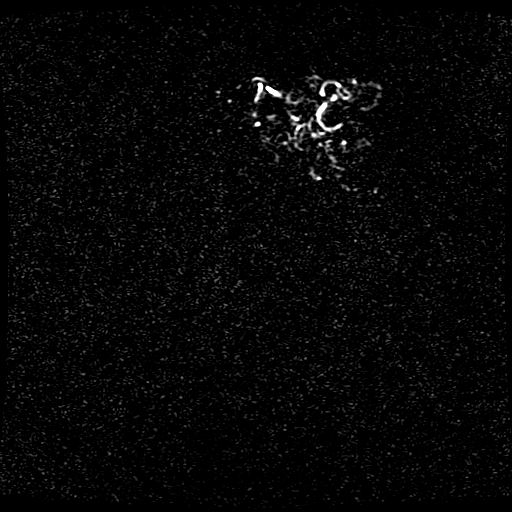

[Series 3: T2 · axial · 5.0mm · 0.43mm/px · 1 of 30 slices shown]
[im 1/30]
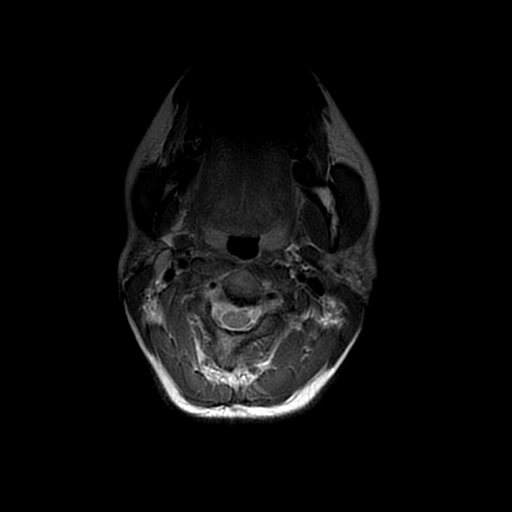

[Series 4: ax dti · axial · 3.0mm · 0.94mm/px · z∈[-45,+117]mm · 8 of 1534 slices shown]
[im 77/1534]
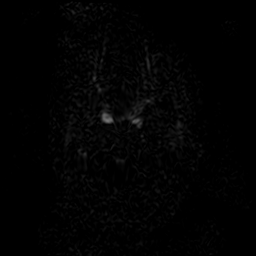
[im 230/1534]
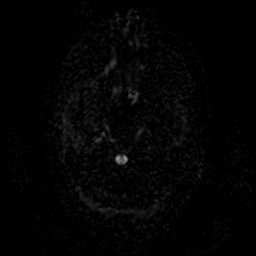
[im 460/1534]
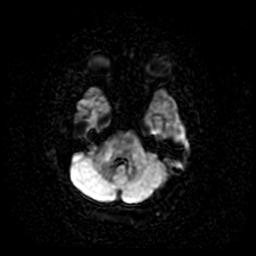
[im 690/1534]
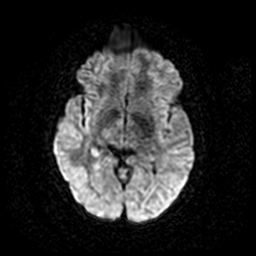
[im 844/1534]
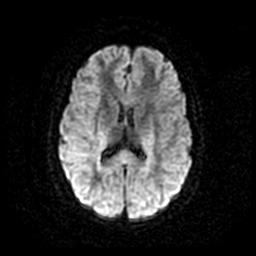
[im 1074/1534]
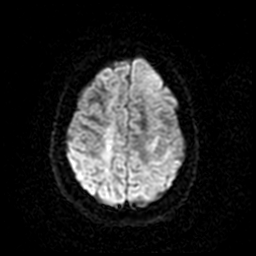
[im 1304/1534]
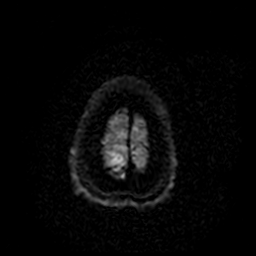
[im 1457/1534]
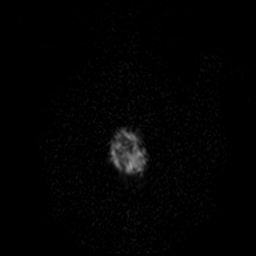

[Series 5: (person_name) · axial · 3.0mm · 0.47mm/px · 1 of 116 slices shown]
[im 1/116]
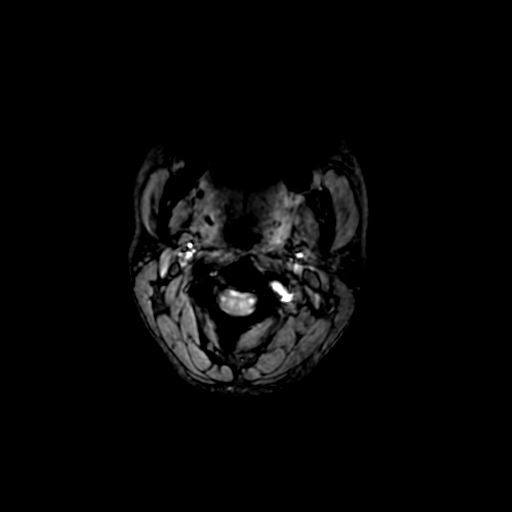

[Series 7: ax 3(person_name) · axial · 1.0mm · 1.02mm/px · z∈[-50,+119]mm · 2 of 170 slices shown (1 of 2)]
[im 1/170]
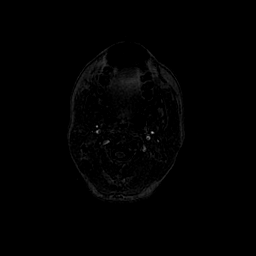
[im 170/170]
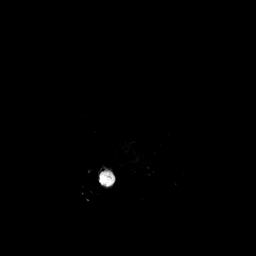

[Series 8: T2 post-contrast · coronal · 3.0mm · 0.39mm/px · 1 of 73 slices shown]
[im 1/73]
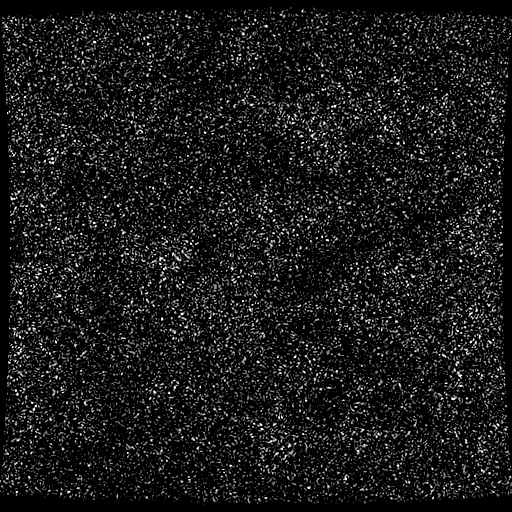

[Series 9: ax 3(person_name) · axial · 1.0mm · 1.02mm/px · z∈[-50,+119]mm · 2 of 170 slices shown (2 of 2)]
[im 1/170]
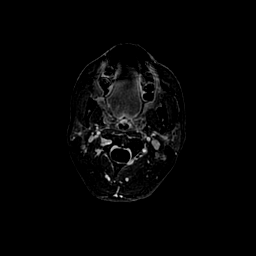
[im 170/170]
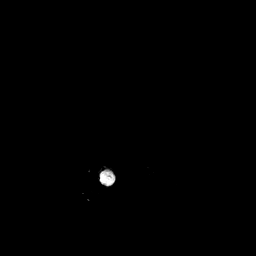

[Series 10: T1 · coronal · 5.0mm · 0.43mm/px · 1 of 38 slices shown]
[im 1/38]
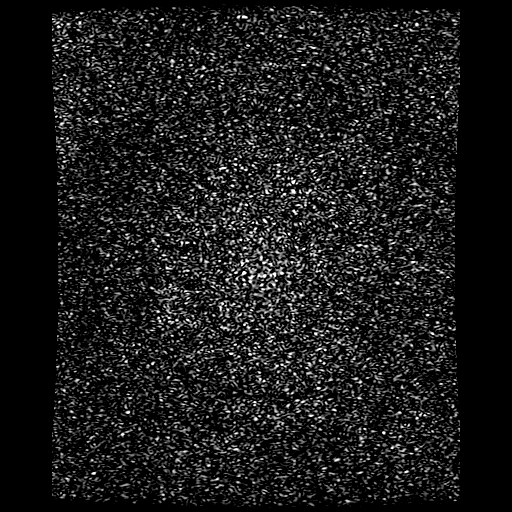

[Series 11: FLAIR · sagittal · 3.0mm · 0.47mm/px · 1 of 40 slices shown (2 of 2)]
[im 1/40]
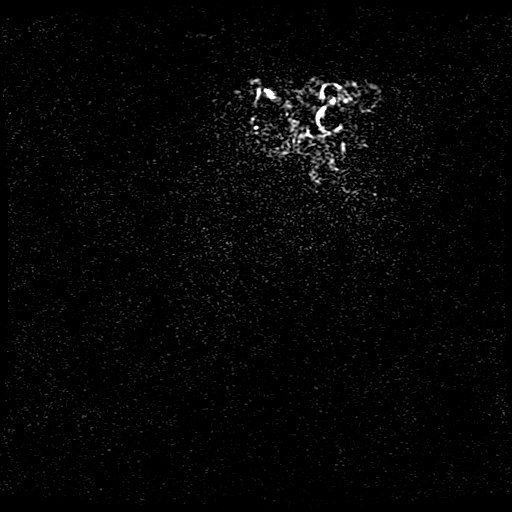

[Series 450: trace · axial · 3.0mm · 0.94mm/px · 1 of 57 slices shown]
[im 1/57]
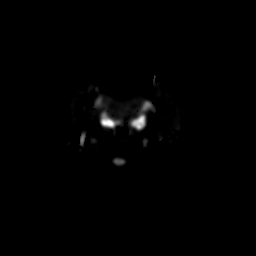

[Series 451: fa(no-q) · axial · 3.0mm · 0.94mm/px · 1 of 54 slices shown]
[im 1/54]
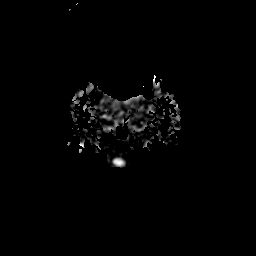

[Series 452: avdc (10^-6 mm²/s)(no-q) · axial · 3.0mm · 0.94mm/px · 1 of 57 slices shown]
[im 1/57]
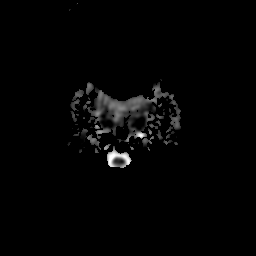

[Series 500: filt_pha: (person_name) · axial · 3.0mm · 0.47mm/px · z∈[-55,+118]mm · 2 of 116 slices shown]
[im 1/116]
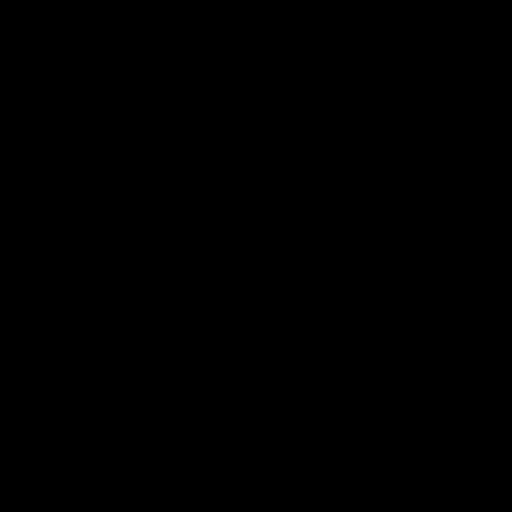
[im 116/116]
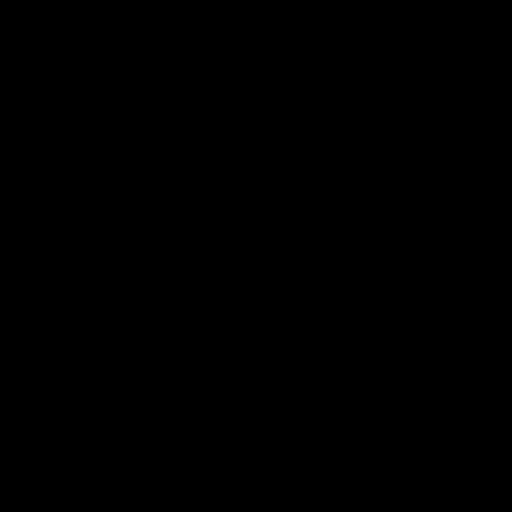

[Series 600: multiplanar reconstruction (mpr) · axial · 1.0mm · 0.50mm/px · z∈[-123,+133]mm · 4 of 257 slices shown (1 of 2)]
[im 1/257]
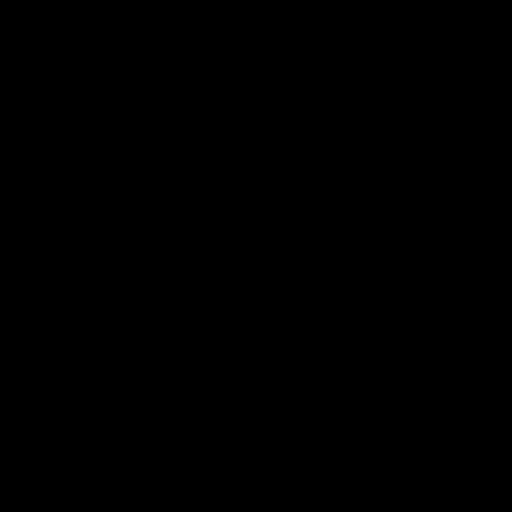
[im 86/257]
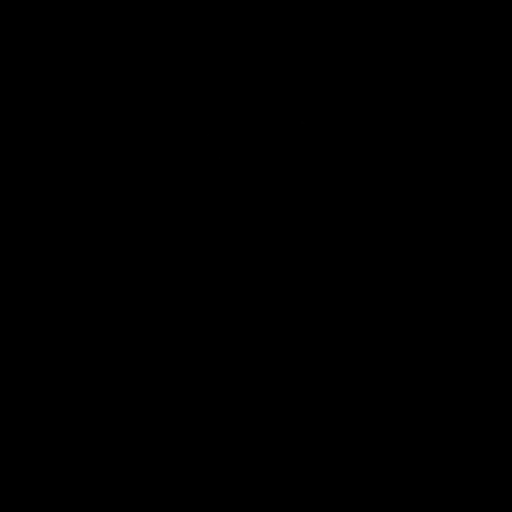
[im 171/257]
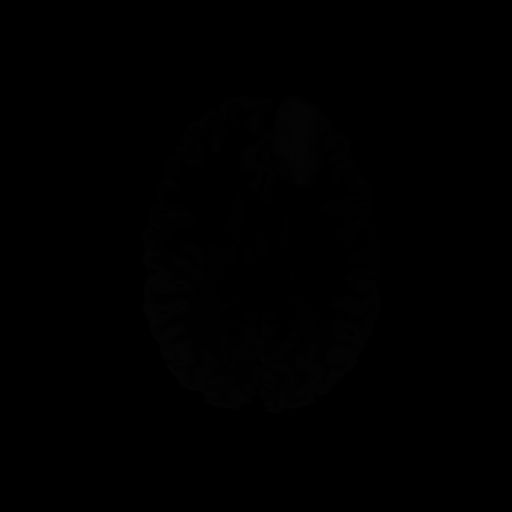
[im 257/257]
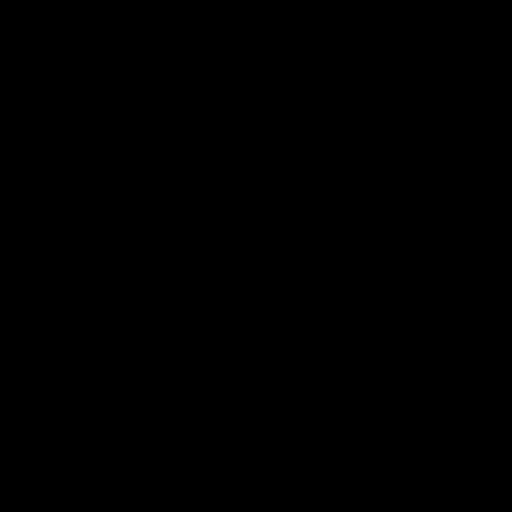

[Series 601: multiplanar reconstruction (mpr) · coronal · 1.0mm · 0.50mm/px · 2 of 244 slices shown (2 of 2)]
[im 1/244]
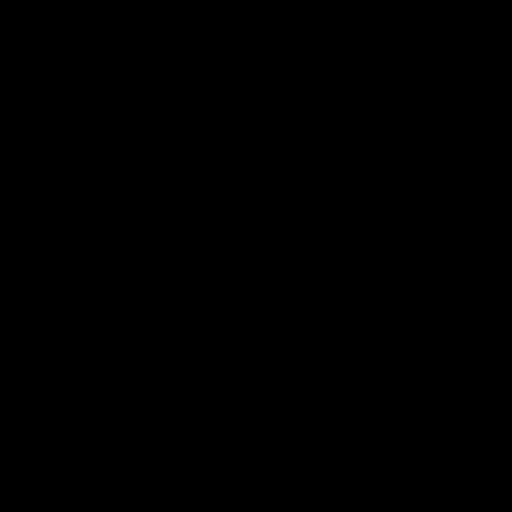
[im 82/244]
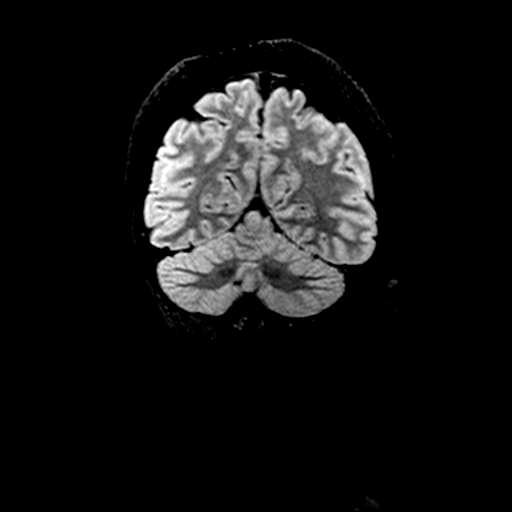

[29 of 48 positions shown; findings below may reference images not displayed]

FINDINGS: Brain:

State size and appearance of a T2/FLAIR hyperintense and expansile
infiltrative mass centered within the cortex and adjacent white
matter of the anterior left frontal lobe. As before, there are small
internal regions cystic change, as well as an internal subcentimeter
focus of enhancement (series 9, image 104). The mass measures 5.7 x
3.1 x 4.2 cm (AP x TV x CC) (remeasured on prior).

Cerebral volume is normal.

There is no acute infarct.

No chronic intracranial blood products.

No extra-axial fluid collection.

No midline shift.

Vascular: Expected proximal arterial flow voids.

Skull and upper cervical spine: No focal marrow lesion.

Sinuses/Orbits: Visualized orbits show no acute finding. Trace
ethmoid and sphenoid sinus mucosal thickening. No significant
mastoid effusion.
IMPRESSION: Unchanged expansile and infiltrative mass centered within the cortex
and adjacent white matter of the anterior left frontal lobe with
internal areas of cystic change as well as a subcentimeter internal
focus of contrast enhancement. This is favored to reflect a
low-to-intermediate grade primary neoplasm such as astrocytoma or
oligodendroglioma.

## 2021-09-10 ENCOUNTER — Telehealth: Payer: Self-pay

## 2021-09-10 NOTE — Telephone Encounter (Signed)
Called pt to advise that her MRI had been rescheduled to 3/5 @ 12:30 PM at [facility]. Pt voiced understanding. ?

## 2021-09-12 ENCOUNTER — Ambulatory Visit (HOSPITAL_COMMUNITY)
Admission: RE | Admit: 2021-09-12 | Discharge: 2021-09-12 | Disposition: A | Discharge status: Home / Self Care | Payer: Medicaid Other | Source: Ambulatory Visit | Attending: Internal Medicine | Admitting: Internal Medicine

## 2021-09-12 ENCOUNTER — Encounter (HOSPITAL_COMMUNITY): Payer: Self-pay

## 2021-09-12 ENCOUNTER — Ambulatory Visit (HOSPITAL_COMMUNITY): Admission: RE | Admit: 2021-09-12 | Payer: No Typology Code available for payment source | Source: Ambulatory Visit

## 2021-09-12 DIAGNOSIS — C719 Malignant neoplasm of brain, unspecified: Secondary | ICD-10-CM

## 2021-09-12 MED ORDER — GADOBUTROL 1 MMOL/ML IV SOLN
5.0000 mL | Freq: Once | INTRAVENOUS | Status: AC | PRN
  Administered 2021-09-12: 5 mL via INTRAVENOUS

## 2021-09-13 ENCOUNTER — Encounter (HOSPITAL_COMMUNITY): Payer: Self-pay

## 2021-09-13 ENCOUNTER — Inpatient Hospital Stay: Payer: Medicaid Other | Attending: Internal Medicine

## 2021-09-13 ENCOUNTER — Ambulatory Visit (HOSPITAL_COMMUNITY): Payer: No Typology Code available for payment source

## 2021-09-13 DIAGNOSIS — C719 Malignant neoplasm of brain, unspecified: Secondary | ICD-10-CM | POA: Insufficient documentation

## 2021-09-14 ENCOUNTER — Other Ambulatory Visit: Payer: Self-pay

## 2021-09-14 ENCOUNTER — Inpatient Hospital Stay (HOSPITAL_BASED_OUTPATIENT_CLINIC_OR_DEPARTMENT_OTHER): Payer: Medicaid Other | Admitting: Internal Medicine

## 2021-09-14 VITALS — BP 114/81 | HR 71 | Temp 97.8°F | Resp 16 | Ht 62.0 in | Wt 118.0 lb

## 2021-09-14 DIAGNOSIS — C719 Malignant neoplasm of brain, unspecified: Secondary | ICD-10-CM | POA: Diagnosis not present

## 2021-09-14 NOTE — Progress Notes (Signed)
[place] at [place], [place] [address] [phone]   Interval Evaluation  Date of Service: 09/14/21 Patient Name: Maureen Key Patient MRN: [id number] Patient DOB: 1981-06-06 Provider: Ventura Sellers, MD  Identifying Statement:  Maureen Key is a 41 y.o. 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 Zada Finders.  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  IDH 1/2 Mutated.  1p/19q co-deleted  TERT Unknown   Interval History:  Maureen Key presents for follow up today after recent MRI brain.  No new or progressive changes today.  No further issues with her leg, lower back has healed up.  Maintaining activity level at home and work.    H+P (12/20/19) Patient presented to medical attention after trauma, fall while roller skating and fracturing tibia one week ago.  This led to trauma evaluation and CNS imaging which demonstrated left frontal mass ~5cm.  She denies any neurologic symptoms, no headaches or seizures.  Currently in cast and wheelchair following tibial fixation over the weekend, healing well.   Medications: Current Outpatient Medications on File Prior to Visit  Medication Sig Dispense Refill   cyclobenzaprine (FLEXERIL) 10 MG tablet Take 1 tablet (10 mg total) by mouth 2 (two) times daily as needed for muscle spasms. 20 tablet 0   ibuprofen (ADVIL) 600 MG tablet Take 1 tablet (600 mg total) by mouth every 6 (six) hours as needed. 30 tablet 0   Multiple Vitamin (MULTIVITAMIN WITH MINERALS) TABS tablet Take 1 tablet by mouth daily.     Vitamin D, Ergocalciferol, (DRISDOL) 1.25 MG (50000 UNIT) CAPS capsule Take 1 capsule (50,000 Units total) by mouth every 7 (seven) days. 8  capsule 0   No current facility-administered medications on file prior to visit.    Allergies: No Known Allergies Past Medical History:  Past Medical History:  Diagnosis Date   Blood transfusion without reported diagnosis    last PPH delivery   Dysmenorrhea    History of placenta accreta in prior pregnancy, currently pregnant    History of postpartum hemorrhage, currently pregnant    History of sepsis    Pregnancy induced hypertension    Sickle cell trait ([place])    Trichimoniasis    Vaginal Pap smear, abnormal    Past Surgical History:  Past Surgical History:  Procedure Laterality Date   ABDOMINAL HYSTERECTOMY  05/12/2017   Procedure: HYSTERECTOMY ABDOMINAL;  Surgeon: Jonnie Kind, MD;  Location: [place];  Service: Obstetrics;;   APPLICATION OF CRANIAL NAVIGATION N/A 02/04/2020   Procedure: APPLICATION OF CRANIAL NAVIGATION;  Surgeon: Judith Part, MD;  Location: [place];  Service: Neurosurgery;  Laterality: N/A;  APPLICATION OF CRANIAL NAVIGATION   CERVICAL CONE BIOPSY     CESAREAN SECTION     CESAREAN SECTION N/A 05/12/2017   Procedure: CESAREAN SECTION WITH Hysterectomy;  Surgeon: Jonnie Kind, MD;  Location: [place];  Service: Obstetrics;  Laterality: N/A;   CRANIOTOMY Left 02/04/2020   Procedure: LEFT CRANIOTOMY FOR TUMOR RESECTION;  Surgeon: Judith Part, MD;  Location: [facility];  Service: Neurosurgery;  Laterality: Left;  LEFT CRANIOTOMY FOR TUMOR RESECTION   TIBIA IM NAIL INSERTION Right 12/13/2019   Procedure: INTRAMEDULLARY (IM) NAIL TIBIAL;  Surgeon: Shona Needles,  MD;  Location: [place];  Service: Orthopedics;  Laterality: Right;   Social History:  Social History   Socioeconomic History   Marital status: Single    Spouse name: Not on file   Number of children: 4   Years of education: Not on file   Highest education level: Not on file  Occupational History   Not on file  Tobacco Use   Smoking status: Light Smoker     Packs/day: 0.25    Types: Cigarettes    Last attempt to quit: 03/08/2017    Years since quitting: 4.5   Smokeless tobacco: Never   Tobacco comments:    3 cigarettes a day  Vaping Use   Vaping Use: Never used  Substance and Sexual Activity   Alcohol use: No   Drug use: Not Currently    Types: Marijuana    Comment: last used 04/09/17   Sexual activity: Yes    Birth control/protection: None, Surgical  Other Topics Concern   Not on file  Social History Narrative   Not on file   Social Determinants of Health   Financial Resource Strain: Not on file  Food Insecurity: Not on file  Transportation Needs: Not on file  Physical Activity: Not on file  Stress: Not on file  Social Connections: Not on file  Intimate Partner Violence: Not on file   Family History:  Family History  Problem Relation Age of Onset   Hypertension Mother    HIV/AIDS Father    Cancer Father        lung   Diabetes Sister    Diabetes Maternal Aunt    Diabetes Maternal Uncle    Cancer Paternal Aunt    Cancer Paternal Uncle    Diabetes Maternal Grandmother    Alzheimer's disease Paternal Grandmother     Review of Systems: Constitutional: Doesn't report fevers, chills or abnormal weight loss Eyes: Doesn't report blurriness of vision Ears, nose, mouth, throat, and face: Doesn't report sore throat Respiratory: Doesn't report cough, dyspnea or wheezes Cardiovascular: Doesn't report palpitation, chest discomfort  Gastrointestinal:  Doesn't report nausea, constipation, diarrhea GU: Doesn't report incontinence Skin: Doesn't report skin rashes Neurological: Per HPI Musculoskeletal: Doesn't report joint pain Behavioral/Psych: Doesn't report anxiety  Physical Exam: Vitals:   09/14/21 1035  BP: 114/81  Pulse: 71  Resp: 16  Temp: 97.8 F (36.6 C)  SpO2: 100%     KPS: 90. General: Alert, cooperative, pleasant, in no acute distress Head: Normal EENT: No conjunctival injection or scleral icterus.   Lungs: Resp effort normal Cardiac: Regular rate Abdomen: Non-distended abdomen Skin: No rashes cyanosis or petechiae. Extremities: No clubbing or edema  Neurologic Exam: Mental Status: Awake, alert, attentive to examiner. Oriented to self and environment. Language is fluent with intact comprehension.  Cranial Nerves: Visual acuity is grossly normal. Visual fields are full. Extra-ocular movements intact. No ptosis. Face is symmetric Motor: Tone and bulk are normal. Power is full in both arms and legs. Reflexes are symmetric, no pathologic reflexes present.  Sensory: Intact to light touch Gait: Orthopedic limitation only  Labs: I have reviewed the data as listed    Component Value Date/Time   NA 136 01/31/2020 1340   NA 143 01/03/2018 0949   K 3.6 01/31/2020 1340   CL 101 01/31/2020 1340   CO2 27 01/31/2020 1340   GLUCOSE 82 01/31/2020 1340   BUN 9 01/31/2020 1340   BUN 14 01/03/2018 0949   CREATININE 0.67 01/31/2020 1340   CALCIUM 9.6  01/31/2020 1340   PROT 7.6 12/12/2019 2340   PROT 7.6 01/03/2018 0949   ALBUMIN 4.5 12/12/2019 2340   ALBUMIN 4.8 01/03/2018 0949   AST 20 12/12/2019 2340   ALT 22 12/12/2019 2340   ALKPHOS 72 12/12/2019 2340   BILITOT 0.2 (L) 12/12/2019 2340   BILITOT 0.4 01/03/2018 0949   GFRNONAA >60 01/31/2020 1340   GFRAA >60 01/31/2020 1340   Lab Results  Component Value Date   WBC 5.2 01/31/2020   NEUTROABS 6.0 12/02/2018   HGB 12.6 01/31/2020   HCT 37.2 01/31/2020   MCV 96.9 01/31/2020   PLT 262 01/31/2020    Imaging:  [place] Clinician Interpretation: I have personally reviewed the CNS images as listed.  My interpretation, in the context of the patient's clinical presentation, is stable disease  MR BRAIN W WO CONTRAST  Result Date: 09/13/2021 CLINICAL DATA:  Brain/CNS neoplasm, assess treatment response; oligodendroglioma follow-up EXAM: MRI HEAD WITHOUT AND WITH CONTRAST TECHNIQUE: Multiplanar, multiecho pulse sequences of the brain and  surrounding structures were obtained without and with intravenous contrast. CONTRAST:  39m GADAVIST GADOBUTROL 1 MMOL/ML IV SOLN COMPARISON:  05/18/2021 FINDINGS: Brain: Postoperative changes are again identified in the high left frontal lobe underlying the craniotomy. Extent of T2 FLAIR hyperintensity is stable. No new stenosis abnormal enhancement. No acute infarction or hemorrhage. Ventricles are stable in size. No hydrocephalus or extra-axial collection. Vascular: Major vessel flow voids at the skull base are preserved. Skull and upper cervical spine: Normal marrow signal is preserved. Sinuses/Orbits: Mild paranasal sinus mucosal thickening. Orbits are unremarkable. Other: Sella is unremarkable.  Mastoid air cells are clear. IMPRESSION: Stable appearance without evidence of recurrent or progressive disease. Electronically Signed   By: PMacy MisM.D.   On: 09/13/2021 08:50     Assessment/Plan Oligodendroglioma, IDH gene mutant and 1p/19q-codeleted ([place]  Ivyanna A Pasquarello is clinically stable and radiographically stable today.    We recommended continuing serial MRI monitoring at this time.  We ask that CDexterreturn to clinic in 4 months following next brain MRI, or sooner as needed.  All questions were answered. The patient knows to call the clinic with any problems, questions or concerns. No barriers to learning were detected.  I have spent a total of 30 minutes of face-to-face and non-face-to-face time, excluding clinical staff time, preparing to see patient, ordering tests and/or medications, counseling the patient, and independently interpreting results and communicating results to the patient/family/caregiver    ZVentura Sellers MD Medical Director of Neuro-Oncology [facility] [place]03/07/23 10:35 AM

## 2021-09-15 ENCOUNTER — Telehealth: Payer: Self-pay | Admitting: Internal Medicine

## 2021-09-15 ENCOUNTER — Other Ambulatory Visit: Payer: Self-pay | Admitting: Radiation Therapy

## 2021-09-15 NOTE — Telephone Encounter (Signed)
Scheduled per 3/7 los, pt has been called and confirmed  ?

## 2021-11-11 ENCOUNTER — Ambulatory Visit: Payer: Medicaid Other | Admitting: Obstetrics & Gynecology

## 2021-11-11 ENCOUNTER — Encounter: Payer: Self-pay | Admitting: Obstetrics & Gynecology

## 2021-11-11 ENCOUNTER — Other Ambulatory Visit (HOSPITAL_COMMUNITY)
Admission: RE | Admit: 2021-11-11 | Discharge: 2021-11-11 | Disposition: A | Discharge status: Home / Self Care | Payer: Medicaid Other | Source: Ambulatory Visit | Attending: Obstetrics & Gynecology | Admitting: Obstetrics & Gynecology

## 2021-11-11 VITALS — BP 122/75 | HR 76 | Ht 60.0 in | Wt 118.0 lb

## 2021-11-11 DIAGNOSIS — Z124 Encounter for screening for malignant neoplasm of cervix: Secondary | ICD-10-CM | POA: Diagnosis present

## 2021-11-11 DIAGNOSIS — Z0000 Encounter for general adult medical examination without abnormal findings: Secondary | ICD-10-CM

## 2021-11-11 DIAGNOSIS — Z01419 Encounter for gynecological examination (general) (routine) without abnormal findings: Secondary | ICD-10-CM

## 2021-11-11 NOTE — Progress Notes (Signed)
Subjective:  ?  ? Maureen Key is a 41 y.o. female here for a routine exam.  Patient's last menstrual period was 08/05/2016. [id number] ?Birth Control Method:  hysterectomy, supracervical ?Menstrual Calendar(currently): amenorrheic  ?Current complaints: none.  ? ?Current acute medical issues:  oligodendroglioma ?  ?Recent Gynecologic History ?Patient's last menstrual period was 08/05/2016. ?Last Pap: 2019,  normal ?Last mammogram: na,   ? ?Past Medical History:  ?Diagnosis Date  ? Blood transfusion without reported diagnosis   ? last [place] delivery  ? History of placenta accreta in prior pregnancy, currently pregnant   ? History of sepsis   ? 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 Sickle cell trait ([place])   ? ? ?Past Surgical History:  ?Procedure Laterality Date  ? ABDOMINAL HYSTERECTOMY  05/12/2017  ? Procedure: HYSTERECTOMY ABDOMINAL;  Surgeon: Jonnie Kind, MD;  Location: [facility];  Service: Obstetrics;;  ? APPLICATION OF CRANIAL NAVIGATION N/A 02/04/2020  ? Procedure: APPLICATION OF CRANIAL NAVIGATION;  Surgeon: Judith Part, MD;  Location: [facility];  Service: Neurosurgery;  Laterality: N/A;  APPLICATION OF CRANIAL NAVIGATION  ? CERVICAL CONE BIOPSY    ? CESAREAN SECTION    ? CESAREAN SECTION N/A 05/12/2017  ? Procedure: CESAREAN SECTION WITH Hysterectomy;  Surgeon: Jonnie Kind, MD;  Location: [facility] [place];  Service: Obstetrics;  Laterality: N/A;  ? CRANIOTOMY Left 02/04/2020  ? Procedure: LEFT CRANIOTOMY FOR TUMOR RESECTION;  Surgeon: Judith Part, MD;  Location: [facility];  Service: Neurosurgery;  Laterality: Left;  LEFT CRANIOTOMY FOR TUMOR RESECTION  ? TIBIA IM NAIL INSERTION Right 12/13/2019  ? Procedure: INTRAMEDULLARY (IM) NAIL TIBIAL;  Surgeon: Shona Needles, MD;  Location: [facility];  Service: Orthopedics;  Laterality: Right;  ? ? ?OB History   ? ? Gravida  ?3  ? Para  ?3  ? Term  ?2  ? Preterm  ?1  ? AB  ?   ? Living  ?3  ?  ? ? SAB  ?   ? IAB  ?   ? Ectopic  ?   ? Multiple  ?1  ? Live  Births  ?3  ?   ?  ? Obstetric Comments  ?Mono/mono twins, scheduled  ?  ? ?  ? ? ?Social History  ? ?Socioeconomic History  ? Marital status: Single  ?  Spouse name: Not on file  ? Number of children: 4  ? Years of education: Not on file  ? Highest education level: Not on file  ?Occupational History  ? Not on file  ?Tobacco Use  ? Smoking status: Light Smoker  ?  Packs/day: 0.25  ?  Types: Cigarettes  ?  Last attempt to quit: 03/08/2017  ?  Years since quitting: 4.6  ? Smokeless tobacco: Never  ? Tobacco comments:  ?  3 cigarettes a day  ?Vaping Use  ? Vaping Use: Never used  ?Substance and Sexual Activity  ? Alcohol use: No  ? Drug use: Not Currently  ?  Types: Marijuana  ?  Comment: last used 04/09/17  ? Sexual activity: Yes  ?  Birth control/protection: None, Surgical  ?Other Topics Concern  ? Not on file  ?Social History Narrative  ? Not on file  ? ?Social Determinants of Health  ? ?Financial Resource Strain: Low Risk   ? Difficulty of Paying Living Expenses: Not very hard  ?Food Insecurity: Food Insecurity Present  ? Worried About Charity fundraiser in the Last Year: Sometimes true  ?  Ran Out of Food in the Last Year: Sometimes true  ?Transportation Needs: No Transportation Needs  ? Lack of Transportation (Medical): No  ? Lack of Transportation (Non-Medical): No  ?Physical Activity: Sufficiently Active  ? Days of Exercise per Week: 2 days  ? Minutes of Exercise per Session: 100 min  ?Stress: No Stress Concern Present  ? Feeling of Stress : Not at all  ?Social Connections: Moderately Isolated  ? Frequency of Communication with Friends and Family: Three times a week  ? Frequency of Social Gatherings with Friends and Family: Three times a week  ? Attends Religious Services: Never  ? Active Member of Clubs or Organizations: No  ? Attends Archivist Meetings: Never  ? Marital Status: Living with partner  ? ? ?Family History  ?Problem Relation Age of Onset  ? Hypertension Mother   ? HIV/AIDS Father   ?  Cancer Father   ?     lung  ? Diabetes Sister   ? Diabetes Maternal Aunt   ? Diabetes Maternal Uncle   ? Cancer Paternal Aunt   ? Cancer Paternal Uncle   ? Diabetes Maternal Grandmother   ? Alzheimer's disease Paternal Grandmother   ? ? ? ?Current Outpatient Medications:  ?  ibuprofen (ADVIL) 600 MG tablet, Take 1 tablet (600 mg total) by mouth every 6 (six) hours as needed., Disp: 30 tablet, Rfl: 0 ?  Multiple Vitamin (MULTIVITAMIN WITH MINERALS) TABS tablet, Take 1 tablet by mouth daily., Disp: , Rfl:  ?  Vitamin D, Ergocalciferol, (DRISDOL) 1.25 MG (50000 UNIT) CAPS capsule, Take 1 capsule (50,000 Units total) by mouth every 7 (seven) days., Disp: 8 capsule, Rfl: 0 ? ?Review of Systems ? ?Review of Systems  ?Constitutional: Negative for fever, chills, weight loss, malaise/fatigue and diaphoresis.  ?HENT: Negative for hearing loss, ear pain, nosebleeds, congestion, sore throat, neck pain, tinnitus and ear discharge.   ?Eyes: Negative for blurred vision, double vision, photophobia, pain, discharge and redness.  ?Respiratory: Negative for cough, hemoptysis, sputum production, shortness of breath, wheezing and stridor.   ?Cardiovascular: Negative for chest pain, palpitations, orthopnea, claudication, leg swelling and PND.  ?Gastrointestinal: negative for abdominal pain. Negative for heartburn, nausea, vomiting, diarrhea, constipation, blood in stool and melena.  ?Genitourinary: Negative for dysuria, urgency, frequency, hematuria and flank pain.  ?Musculoskeletal: Negative for myalgias, back pain, joint pain and falls.  ?Skin: Negative for itching and rash.  ?Neurological: Negative for dizziness, tingling, tremors, sensory change, speech change, focal weakness, seizures, loss of consciousness, weakness and headaches.  ?Endo/Heme/Allergies: Negative for environmental allergies and polydipsia. Does not bruise/bleed easily.  ?Psychiatric/Behavioral: Negative for depression, suicidal ideas, hallucinations, memory loss and  substance abuse. The patient is not nervous/anxious and does not have insomnia.   ? ?  ?  ?Objective:  ?Blood pressure 122/75, pulse 76, height 5' (1.524 m), weight 118 lb (53.5 kg), last menstrual period 08/05/2016.  ? Physical Exam  ?Vitals reviewed. ?Constitutional: She is oriented to person, place, and time. She appears well-developed and well-nourished.  ?HENT:  ?Head: Normocephalic and atraumatic.        ?Right Ear: External ear normal.  ?Left Ear: External ear normal.  ?Nose: Nose normal.  ?Mouth/Throat: Oropharynx is clear and moist.  ?Eyes: Conjunctivae and EOM are normal. Pupils are equal, round, and reactive to light. Right eye exhibits no discharge. Left eye exhibits no discharge. No scleral icterus.  ?Neck: Normal range of motion. Neck supple. No tracheal deviation present. No thyromegaly present.  ?Cardiovascular: Normal  rate, regular rhythm, normal heart sounds and intact distal pulses.  Exam reveals no gallop and no friction rub.   ?No murmur heard. ?Respiratory: Effort normal and breath sounds normal. No respiratory distress. She has no wheezes. She has no rales. She exhibits no tenderness.  ?GI: Soft. Bowel sounds are normal. She exhibits no distension and no mass. There is no tenderness. There is no rebound and no guarding.  ?Genitourinary:  ?Breasts no masses skin changes or nipple changes bilaterally ?     Vulva is normal without lesions ?Vagina is pink moist without discharge ?Cervix normal in appearance and pap is done ?Uterus is absent ?Adnexa is negative with normal sized ovaries  ? ?Musculoskeletal: Normal range of motion. She exhibits no edema and no tenderness.  ?Neurological: She is alert and oriented to person, place, and time. She has normal reflexes. She displays normal reflexes. No cranial nerve deficit. She exhibits normal muscle tone. Coordination normal.  ?Skin: Skin is warm and dry. No rash noted. No erythema. No pallor.  ?Psychiatric: She has a normal mood and affect. Her  behavior is normal. Judgment and thought content normal.  ? ?   ? ?Medications Ordered at today's visit: ?No orders of the defined types were placed in this encounter. ? ? ?Other orders placed at today's visit

## 2021-11-12 LAB — CYTOLOGY - PAP
Chlamydia: NEGATIVE
Comment: NEGATIVE
Comment: NEGATIVE
Comment: NORMAL
Diagnosis: NEGATIVE
High risk HPV: NEGATIVE
Neisseria Gonorrhea: NEGATIVE

## 2022-01-14 ENCOUNTER — Ambulatory Visit (HOSPITAL_COMMUNITY)
Admission: RE | Admit: 2022-01-14 | Discharge: 2022-01-14 | Disposition: A | Discharge status: Home / Self Care | Payer: Medicaid Other | Source: Ambulatory Visit | Attending: Internal Medicine | Admitting: Internal Medicine

## 2022-01-14 DIAGNOSIS — C719 Malignant neoplasm of brain, unspecified: Secondary | ICD-10-CM | POA: Diagnosis present

## 2022-01-14 MED ORDER — GADOBUTROL 1 MMOL/ML IV SOLN
5.0000 mL | Freq: Once | INTRAVENOUS | Status: AC | PRN
  Administered 2022-01-14: 5 mL via INTRAVENOUS

## 2022-01-17 ENCOUNTER — Inpatient Hospital Stay: Payer: Medicaid Other | Attending: Internal Medicine | Admitting: Internal Medicine

## 2022-01-17 ENCOUNTER — Inpatient Hospital Stay: Payer: Medicaid Other

## 2022-01-17 VITALS — BP 117/78 | HR 69 | Temp 98.3°F | Resp 17 | Wt 119.2 lb

## 2022-01-17 DIAGNOSIS — C719 Malignant neoplasm of brain, unspecified: Secondary | ICD-10-CM

## 2022-01-17 DIAGNOSIS — C711 Malignant neoplasm of frontal lobe: Secondary | ICD-10-CM | POA: Diagnosis present

## 2022-01-17 NOTE — Progress Notes (Signed)
[facility] at [place], [place] [address] [phone]   Interval Evaluation  Date of Service: 01/17/22 Patient Name: Maureen Key Patient MRN: [id number] Patient DOB: 24-Feb-1981 Provider: Ventura Sellers, MD  Identifying Statement:  Maureen Key is a 41 y.o. 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 Zada Finders.  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  IDH 1/2 Mutated.  1p/19q co-deleted  TERT Unknown   Interval History:  Maureen Key presents for follow up today after recent MRI brain.  She denies any clinical changes today.  No further issues with her leg, lower back has healed up.  Maintaining activity level at home and work.    H+P (12/20/19) Patient presented to medical attention after trauma, fall while roller skating and fracturing tibia one week ago.  This led to trauma evaluation and CNS imaging which demonstrated left frontal mass ~5cm.  She denies any neurologic symptoms, no headaches or seizures.  Currently in cast and wheelchair following tibial fixation over the weekend, healing well.   Medications: Current Outpatient Medications on File Prior to Visit  Medication Sig Dispense Refill   ibuprofen (ADVIL) 600 MG tablet Take 1 tablet (600 mg total) by mouth every 6 (six) hours as needed. 30 tablet 0   Multiple Vitamin (MULTIVITAMIN WITH MINERALS) TABS tablet Take 1 tablet by mouth daily.     Vitamin D, Ergocalciferol, (DRISDOL) 1.25 MG (50000 UNIT) CAPS capsule Take 1 capsule (50,000 Units total) by mouth every 7 (seven) days. 8 capsule 0   No current facility-administered medications on file prior to visit.    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[place])    Sickle cell trait ([place])    Past Surgical History:  Past Surgical History:  Procedure Laterality Date   ABDOMINAL HYSTERECTOMY  05/12/2017   Procedure: HYSTERECTOMY ABDOMINAL;  Surgeon: Jonnie Kind, MD;  Location: [facility];  Service: Obstetrics;;   APPLICATION OF CRANIAL NAVIGATION N/A 02/04/2020   Procedure: APPLICATION OF CRANIAL NAVIGATION;  Surgeon: Judith Part, MD;  Location: [facility];  Service: Neurosurgery;  Laterality: N/A;  APPLICATION OF CRANIAL NAVIGATION   CERVICAL CONE BIOPSY     CESAREAN SECTION     CESAREAN SECTION N/A 05/12/2017   Procedure: CESAREAN SECTION WITH Hysterectomy;  Surgeon: Jonnie Kind, MD;  Location: [facility];  Service: Obstetrics;  Laterality: N/A;   CRANIOTOMY Left 02/04/2020   Procedure: LEFT CRANIOTOMY FOR TUMOR RESECTION;  Surgeon: Judith Part, MD;  Location: [facility];  Service: Neurosurgery;  Laterality: Left;  LEFT CRANIOTOMY FOR TUMOR RESECTION   TIBIA IM NAIL INSERTION Right 12/13/2019   Procedure: INTRAMEDULLARY (IM) NAIL TIBIAL;  Surgeon: Shona Needles, MD;  Location: [facility];  Service: Orthopedics;  Laterality: Right;   Social History:  Social History   Socioeconomic History   Marital status: Single    Spouse name: Not on file   Number of children: 4   Years of education: Not on file   Highest  education level: Not on file  Occupational History   Not on file  Tobacco Use   Smoking status: Light Smoker    Packs/day: 0.25    Types: Cigarettes    Last attempt to quit: 03/08/2017    Years since quitting: 4.8   Smokeless tobacco: Never   Tobacco comments:    3 cigarettes a day  Vaping Use   Vaping Use: Never used  Substance and Sexual Activity   Alcohol use: No   Drug use: Not  Currently    Types: Marijuana    Comment: last used 04/09/17   Sexual activity: Yes    Birth control/protection: None, Surgical  Other Topics Concern   Not on file  Social History Narrative   Not on file   Social Determinants of Health   Financial Resource Strain: Low Risk  (11/11/2021)   Overall Financial Resource Strain (CARDIA)    Difficulty of Paying Living Expenses: Not very hard  Food Insecurity: Food Insecurity Present (11/11/2021)   Hunger Vital Sign    Worried About Running Out of Food in the Last Year: Sometimes true    Ran Out of Food in the Last Year: Sometimes true  Transportation Needs: No Transportation Needs (11/11/2021)   PRAPARE - Hydrologist (Medical): No    Lack of Transportation (Non-Medical): No  Physical Activity: Sufficiently Active (11/11/2021)   Exercise Vital Sign    Days of Exercise per Week: 2 days    Minutes of Exercise per Session: 100 min  Stress: No Stress Concern Present (11/11/2021)   [place]    Feeling of Stress : Not at all  Social Connections: Moderately Isolated (11/11/2021)   Social Connection and Isolation Panel [NHANES]    Frequency of Communication with Friends and Family: Three times a week    Frequency of Social Gatherings with Friends and Family: Three times a week    Attends Religious Services: Never    Active Member of Clubs or Organizations: No    Attends Archivist Meetings: Never    Marital Status: Living with partner  Intimate Partner Violence: Not At Risk (11/11/2021)   Humiliation, Afraid, Rape, and Kick questionnaire    Fear of Current or Ex-Partner: No    Emotionally Abused: No    Physically Abused: No    Sexually Abused: No   Family History:  Family History  Problem Relation Age of Onset   Hypertension Mother    HIV/AIDS Father    Cancer Father        lung   Diabetes Sister    Diabetes Maternal Aunt    Diabetes  Maternal Uncle    Cancer Paternal Aunt    Cancer Paternal Uncle    Diabetes Maternal Grandmother    Alzheimer's disease Paternal Grandmother     Review of Systems: Constitutional: Doesn't report fevers, chills or abnormal weight loss Eyes: Doesn't report blurriness of vision Ears, nose, mouth, throat, and face: Doesn't report sore throat Respiratory: Doesn't report cough, dyspnea or wheezes Cardiovascular: Doesn't report palpitation, chest discomfort  Gastrointestinal:  Doesn't report nausea, constipation, diarrhea GU: Doesn't report incontinence Skin: Doesn't report skin rashes Neurological: Per HPI Musculoskeletal: Doesn't report joint pain Behavioral/Psych: Doesn't report anxiety  Physical Exam: Vitals:   01/17/22 1112  BP: 117/78  Pulse: 69  Resp: 17  Temp: 98.3 F (36.8 C)  SpO2: 100%    KPS: 90. General: Alert, cooperative, pleasant, in no acute  distress Head: Normal EENT: No conjunctival injection or scleral icterus.  Lungs: Resp effort normal Cardiac: Regular rate Abdomen: Non-distended abdomen Skin: No rashes cyanosis or petechiae. Extremities: No clubbing or edema  Neurologic Exam: Mental Status: Awake, alert, attentive to examiner. Oriented to self and environment. Language is fluent with intact comprehension.  Cranial Nerves: Visual acuity is grossly normal. Visual fields are full. Extra-ocular movements intact. No ptosis. Face is symmetric Motor: Tone and bulk are normal. Power is full in both arms and legs. Reflexes are symmetric, no pathologic reflexes present.  Sensory: Intact to light touch Gait: Orthopedic limitation only  Labs: I have reviewed the data as listed    Component Value Date/Time   NA 136 01/31/2020 1340   NA 143 01/03/2018 0949   K 3.6 01/31/2020 1340   CL 101 01/31/2020 1340   CO2 27 01/31/2020 1340   GLUCOSE 82 01/31/2020 1340   BUN 9 01/31/2020 1340   BUN 14 01/03/2018 0949   CREATININE 0.67 01/31/2020 1340   CALCIUM 9.6  01/31/2020 1340   PROT 7.6 12/12/2019 2340   PROT 7.6 01/03/2018 0949   ALBUMIN 4.5 12/12/2019 2340   ALBUMIN 4.8 01/03/2018 0949   AST 20 12/12/2019 2340   ALT 22 12/12/2019 2340   ALKPHOS 72 12/12/2019 2340   BILITOT 0.2 (L) 12/12/2019 2340   BILITOT 0.4 01/03/2018 0949   GFRNONAA >60 01/31/2020 1340   GFRAA >60 01/31/2020 1340   Lab Results  Component Value Date   WBC 5.2 01/31/2020   NEUTROABS 6.0 12/02/2018   HGB 12.6 01/31/2020   HCT 37.2 01/31/2020   MCV 96.9 01/31/2020   PLT 262 01/31/2020    Imaging:  [facility] Clinician Interpretation: I have personally reviewed the CNS images as listed.  My interpretation, in the context of the patient's clinical presentation, is stable disease  MR BRAIN W WO CONTRAST  Result Date: 01/14/2022 CLINICAL DATA:  Oligodendroglioma.  Assess treatment response EXAM: MRI HEAD WITHOUT AND WITH CONTRAST TECHNIQUE: Multiplanar, multiecho pulse sequences of the brain and surrounding structures were obtained without and with intravenous contrast. CONTRAST:  55m GADAVIST GADOBUTROL 1 MMOL/ML IV SOLN COMPARISON:  09/12/2021 FINDINGS: Brain: High and anterior left frontal resection site has stable appearance without any masslike features or progressive T2 hyperintensity. Stable linear, scar-like enhancement. No incidental infarct, hemorrhage, hydrocephalus, or collection Vascular: Normal flow voids and vascular enhancement Skull and upper cervical spine: Unremarkable marrow signal Sinuses/Orbits: Negative IMPRESSION: Stable left frontal treatment site.  No adverse finding. Electronically Signed   By: JJorje GuildM.D.   On: 01/14/2022 17:44     Assessment/Plan Oligodendroglioma, IDH gene mutant and 1p/19q-codeleted ([place]  [facility] is clinically stable and radiographically stable today.  No new or progressive changes.  We recommended continuing serial MRI monitoring at this time.  We ask that CSan Brunoreturn to clinic in 6  months following next brain MRI, or sooner as needed.  All questions were answered. The patient knows to call the clinic with any problems, questions or concerns. No barriers to learning were detected.  I have spent a total of 30 minutes of face-to-face and non-face-to-face time, excluding clinical staff time, preparing to see patient, ordering tests and/or medications, counseling the patient, and independently interpreting results and communicating results to the patient/family/caregiver    ZVentura Sellers MD Medical Director of Neuro-Oncology [facility]at [facility]07/10/23 11:12 AM

## 2022-01-20 ENCOUNTER — Other Ambulatory Visit: Payer: Self-pay | Admitting: *Deleted

## 2022-01-20 DIAGNOSIS — C719 Malignant neoplasm of brain, unspecified: Secondary | ICD-10-CM

## 2022-04-28 ENCOUNTER — Emergency Department (HOSPITAL_COMMUNITY): Payer: Medicaid Other

## 2022-04-28 ENCOUNTER — Emergency Department (HOSPITAL_COMMUNITY)
Admission: EM | Admit: 2022-04-28 | Discharge: 2022-04-28 | Disposition: A | Discharge status: Home / Self Care | Payer: Medicaid Other | Attending: Emergency Medicine | Admitting: Emergency Medicine

## 2022-04-28 ENCOUNTER — Other Ambulatory Visit: Payer: Self-pay

## 2022-04-28 ENCOUNTER — Encounter (HOSPITAL_COMMUNITY): Payer: Self-pay

## 2022-04-28 ENCOUNTER — Encounter (HOSPITAL_COMMUNITY): Payer: Self-pay | Admitting: *Deleted

## 2022-04-28 DIAGNOSIS — Z20822 Contact with and (suspected) exposure to covid-19: Secondary | ICD-10-CM | POA: Diagnosis not present

## 2022-04-28 DIAGNOSIS — R519 Headache, unspecified: Secondary | ICD-10-CM | POA: Diagnosis present

## 2022-04-28 DIAGNOSIS — C719 Malignant neoplasm of brain, unspecified: Secondary | ICD-10-CM | POA: Diagnosis not present

## 2022-04-28 DIAGNOSIS — R0981 Nasal congestion: Secondary | ICD-10-CM

## 2022-04-28 LAB — CBC WITH DIFFERENTIAL/PLATELET
Abs Immature Granulocytes: 0 10*3/uL (ref 0.00–0.07)
Basophils Absolute: 0 10*3/uL (ref 0.0–0.1)
Basophils Relative: 0 %
Eosinophils Absolute: 0.1 10*3/uL (ref 0.0–0.5)
Eosinophils Relative: 1 %
HCT: 36.8 % (ref 36.0–46.0)
Hemoglobin: 12.9 g/dL (ref 12.0–15.0)
Immature Granulocytes: 0 %
Lymphocytes Relative: 13 %
Lymphs Abs: 1 10*3/uL (ref 0.7–4.0)
MCH: 32.7 pg (ref 26.0–34.0)
MCHC: 35.1 g/dL (ref 30.0–36.0)
MCV: 93.4 fL (ref 80.0–100.0)
Monocytes Absolute: 0.3 10*3/uL (ref 0.1–1.0)
Monocytes Relative: 3 %
Neutro Abs: 6.4 10*3/uL (ref 1.7–7.7)
Neutrophils Relative %: 83 %
Platelets: 200 10*3/uL (ref 150–400)
RBC: 3.94 MIL/uL (ref 3.87–5.11)
RDW: 11.9 % (ref 11.5–15.5)
WBC: 7.7 10*3/uL (ref 4.0–10.5)
nRBC: 0 % (ref 0.0–0.2)

## 2022-04-28 LAB — BASIC METABOLIC PANEL
Anion gap: 8 (ref 5–15)
BUN: 13 mg/dL (ref 6–20)
CO2: 22 mmol/L (ref 22–32)
Calcium: 9.2 mg/dL (ref 8.9–10.3)
Chloride: 108 mmol/L (ref 98–111)
Creatinine, Ser: 0.73 mg/dL (ref 0.44–1.00)
GFR, Estimated: >60 mL/min (ref >60)
Glucose, Bld: 148 mg/dL — ABNORMAL HIGH (ref 70–99)
Potassium: 3.2 mmol/L — ABNORMAL LOW (ref 3.5–5.1)
Sodium: 138 mmol/L (ref 135–145)

## 2022-04-28 LAB — RESP PANEL BY RT-PCR (RSV, FLU A&B, COVID)  RVPGX2
Influenza A by PCR: NEGATIVE
Influenza B by PCR: NEGATIVE
Resp Syncytial Virus by PCR: NEGATIVE
SARS Coronavirus 2 by RT PCR: NEGATIVE

## 2022-04-28 MED ORDER — CETIRIZINE HCL 10 MG PO TABS: 10.0000 mg | ORAL_TABLET | Freq: Every day | ORAL | 0 refills | Status: DC

## 2022-04-28 MED ORDER — GADOBUTROL 1 MMOL/ML IV SOLN
5.0000 mL | Freq: Once | INTRAVENOUS | Status: AC | PRN
  Administered 2022-04-28: 5 mL via INTRAVENOUS

## 2022-04-28 MED ORDER — POTASSIUM CHLORIDE CRYS ER 20 MEQ PO TBCR
40.0000 meq | EXTENDED_RELEASE_TABLET | Freq: Once | ORAL | Status: AC
  Administered 2022-04-28: 40 meq via ORAL
  Filled 2022-04-28: qty 2

## 2022-04-28 MED ORDER — ACETAMINOPHEN 500 MG PO TABS: 500.0000 mg | ORAL_TABLET | Freq: Four times a day (QID) | ORAL | 0 refills | Status: DC | PRN

## 2022-04-28 NOTE — ED Triage Notes (Signed)
Pt states she started to have a headache with left side facial numbness that started yesterday; pt has had a brain tumor removed in the past year  Pt states she has also been exposed to covid and wants to make sure she does not have that as well

## 2022-04-28 NOTE — Discharge Instructions (Addendum)
We saw you in the ER for headache and nasal congestion All the results in the ER are normal, labs and imaging. MRI is reassuring and covid test is negative. We are not sure what is causing your symptoms. The workup in the ER is not complete, and is limited to screening for life threatening and emergent conditions only, so please see a primary care doctor for further evaluation.  Take Tylenol for headaches.  If the headaches do not get better in 5 to 7 days, consider calling Dr. Mickeal Skinner to set up an appointment sooner than January.

## 2022-04-28 NOTE — ED Provider Notes (Addendum)
[facility] Provider Note   CSN: [id number] Arrival date & time: 04/28/22  4132     History  Chief Complaint  Patient presents with   Headache    Maureen Key is a 41 y.o. female.  HPI    41 year old female comes in with chief complaint of headache and left-sided facial numbness. 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  Patient is following up with neuro oncology as an outpatient.  She states that she started having left-sided and frontal bilateral headache yesterday.  She is also having left-sided facial numbness with it.  Given her history, she wanted to come to the ER to make sure things fine.  She denies any slurred speech, focal weakness of the extremities, focal numbness of the extremities, dizziness, vision change.  Headaches are typical of her recurrent headache, however the symptoms are more severe than usual.  Patient also wants to be tested for COVID-19 as she was exposed to her uncle who has since tested positive.  She is having some nasal congestion.  Home Medications Prior to Admission medications   Medication Sig Start Date End Date Taking? Authorizing Provider  acetaminophen (TYLENOL) 500 MG tablet Take 1 tablet (500 mg total) by mouth every 6 (six) hours as needed. 04/28/22  Yes Varney Biles, MD  cetirizine (ZYRTEC) 10 MG tablet Take 1 tablet (10 mg total) by mouth daily. 04/28/22  Yes Varney Biles, MD  ibuprofen (ADVIL) 600 MG tablet Take 1 tablet (600 mg total) by mouth every 6 (six) hours as needed. Patient not taking: Reported on 04/28/2022 05/16/21   Domenic Moras, PA-C  Vitamin D, Ergocalciferol, (DRISDOL) 1.25 MG (50000 UNIT) CAPS capsule Take 1 capsule (50,000 Units total) by mouth every 7 (seven) days. Patient not taking: Reported on 04/28/2022 12/21/19   Ainsley Spinner, PA-C      Allergies    Patient has no known allergies.    Review of Systems   Review of Systems  All other  systems reviewed and are negative.   Physical Exam Updated Vital Signs BP 112/81   Pulse (!) 108   Temp 99 F (37.2 C) (Oral)   Resp 14   Ht 5' (1.524 m)   Wt 54.4 kg   LMP 08/05/2016   SpO2 100%   BMI 23.44 kg/m  Physical Exam Vitals and nursing note reviewed.  Constitutional:      Appearance: She is well-developed.  HENT:     Head: Atraumatic.  Eyes:     General: No visual field deficit. Cardiovascular:     Rate and Rhythm: Normal rate.  Pulmonary:     Effort: Pulmonary effort is normal.  Musculoskeletal:     Cervical back: Normal range of motion and neck supple.  Skin:    General: Skin is warm and dry.  Neurological:     Mental Status: She is alert and oriented to person, place, and time.     Cranial Nerves: No cranial nerve deficit, dysarthria or facial asymmetry.     Sensory: Sensory deficit present.     Motor: No weakness.     Comments: Subjective numbness to the left side of the face     ED Results / Procedures / Treatments   Labs (all labs ordered are listed, but only abnormal results are displayed) Labs Reviewed  BASIC METABOLIC PANEL - Abnormal; Notable for the following components:      Result Value   Potassium 3.2 (*)    Glucose,  Bld 148 (*)    All other components within normal limits  RESP PANEL BY RT-PCR (RSV, FLU A&B, COVID)  [id number]  CBC WITH DIFFERENTIAL/PLATELET    EKG None  Radiology MR Brain W and Wo Contrast  Result Date: 04/28/2022 CLINICAL DATA:  New onset headache and left-sided facial numbness beginning yesterday. Status post resection of oligodendroglioma. Meningioma. EXAM: MRI HEAD WITHOUT AND WITH CONTRAST TECHNIQUE: Multiplanar, multiecho pulse sequences of the brain and surrounding structures were obtained without and with intravenous contrast. CONTRAST:  92m GADAVIST GADOBUTROL 1 MMOL/ML IV SOLN COMPARISON:  Multiple prior MRI of the head without and with contrast including 01/14/2022, 09/12/2021 and 05/18/2021 FINDINGS:  Brain: Stable chronic encephalomalacia is present in the high anterior left frontal lobe. No residual recurrent tumor is present. Associated T2 signal changes are stable No acute infarct or hemorrhage is present. No other significant white matter disease is present. Deep brain nuclei are within normal limits. The ventricles are of normal size. No significant extraaxial fluid collection is present. The internal auditory canals are within normal limits. The brainstem and cerebellum are within normal limits. Postcontrast images demonstrate no other pathologic enhancement. Vascular: Flow is present in the major intracranial arteries. Skull and upper cervical spine: The craniocervical junction is normal. Upper cervical spine is within normal limits. Marrow signal is unremarkable. Sinuses/Orbits: The paranasal sinuses and mastoid air cells are clear. The globes and orbits are within normal limits. IMPRESSION: 1. No acute intracranial abnormality or significant interval change. 2. Stable chronic encephalomalacia and gliosis in the high anterior left frontal lobe. 3. No evidence for residual or recurrent tumor. Electronically Signed   By: CSan MorelleM.D.   On: 04/28/2022 10:30    Procedures Procedures    Medications Ordered in ED Medications  gadobutrol (GADAVIST) 1 MMOL/ML injection 5 mL (5 mLs Intravenous Contrast Given 04/28/22 1012)  potassium chloride SA (KLOR-CON M) CR tablet 40 mEq (40 mEq Oral Given 04/28/22 1121)    ED Course/ Medical Decision Making/ A&P                           Medical Decision Making Problems Addressed: Generalized headache: acute illness or injury with systemic symptoms Nasal congestion: self-limited or minor problem [place]rog[place]oma [place]: chronic illness or injury that poses a threat to life or bodily functions  Amount and/or Complexity of Data Reviewed Labs: ordered. Radiology: ordered.  Risk OTC drugs. Prescription drug management.   This patient  presents to the ED with chief complaint(s) of headache and left-sided facial numbness with pertinent past medical history of oligodendroglioma status post resection.The complaint involves an extensive differential diagnosis and also carries with it a high risk of complications and morbidity.    The differential diagnosis includes : Worsening of oligodendroglioma, intracerebral bleed, acute stroke. She also is concerned about COVID-19 exposure and is having nasal congestion.  Viral URI and COVID-19 URI are in the differential.  The initial plan is to get MRI brain with and without contrast along with basic labs and COVID-19 test   Additional history obtained: Records reviewed  reviewed neuro-oncology visit from July and also reviewed patient's previous MRI.  Independent labs interpretation:  The following labs were independently interpreted: Mild hypokalemia noted  Independent visualization and interpretation of imaging: - I independently visualized the following imaging with scope of interpretation limited to determining acute life threatening conditions related to emergency care: MRI brain with and without contrast, which revealed no evidence of brain  bleed. Radiology interprets stable oligodendroglioma  Treatment and Reassessment: Results of the ER work-up discussed with the patient.  Outpatient follow-up recommendation also discussed.  Consultation: - Consulted or discussed management/test interpretation with external professional: Discussed with Dr. Mickeal Skinner, neuro-oncology.  He recommends follow-up in his clinic if the headaches persist. Final Clinical Impression(s) / ED Diagnoses Final diagnoses:  Generalized headache  Oligodendroglioma ([place])  Nasal congestion    Rx / DC Orders ED Discharge Orders          Ordered    acetaminophen (TYLENOL) 500 MG tablet  Every 6 hours PRN        04/28/22 1149    cetirizine (ZYRTEC) 10 MG tablet  Daily        04/28/22 1149               Varney Biles, MD 04/28/22 1148    Varney Biles, MD 04/28/22 1150

## 2022-07-12 ENCOUNTER — Other Ambulatory Visit: Payer: Self-pay | Admitting: Radiation Therapy

## 2022-07-15 ENCOUNTER — Ambulatory Visit (HOSPITAL_COMMUNITY)
Admission: RE | Admit: 2022-07-15 | Discharge: 2022-07-15 | Disposition: A | Discharge status: Home / Self Care | Payer: Medicare Other | Source: Ambulatory Visit | Attending: Internal Medicine | Admitting: Internal Medicine

## 2022-07-15 DIAGNOSIS — C719 Malignant neoplasm of brain, unspecified: Secondary | ICD-10-CM | POA: Insufficient documentation

## 2022-07-15 MED ORDER — GADOBUTROL 1 MMOL/ML IV SOLN
5.0000 mL | Freq: Once | INTRAVENOUS | Status: AC | PRN
  Administered 2022-07-15: 5 mL via INTRAVENOUS

## 2022-07-18 ENCOUNTER — Inpatient Hospital Stay: Payer: Medicare Other | Attending: Internal Medicine | Admitting: Internal Medicine

## 2022-07-18 ENCOUNTER — Inpatient Hospital Stay: Payer: Medicare Other

## 2022-07-18 VITALS — BP 137/85 | HR 78 | Temp 97.5°F | Resp 20 | Wt 121.0 lb

## 2022-07-18 DIAGNOSIS — Z08 Encounter for follow-up examination after completed treatment for malignant neoplasm: Secondary | ICD-10-CM | POA: Diagnosis present

## 2022-07-18 DIAGNOSIS — Z85841 Personal history of malignant neoplasm of brain: Secondary | ICD-10-CM | POA: Insufficient documentation

## 2022-07-18 DIAGNOSIS — G43909 Migraine, unspecified, not intractable, without status migrainosus: Secondary | ICD-10-CM | POA: Insufficient documentation

## 2022-07-18 DIAGNOSIS — R110 Nausea: Secondary | ICD-10-CM | POA: Insufficient documentation

## 2022-07-18 DIAGNOSIS — C719 Malignant neoplasm of brain, unspecified: Secondary | ICD-10-CM

## 2022-07-18 MED ORDER — PREDNISONE 50 MG PO TABS: 50.0000 mg | ORAL_TABLET | Freq: Every day | ORAL | 0 refills | Status: DC

## 2022-07-18 NOTE — Progress Notes (Signed)
[facility] at [place], [place] [address] [phone]   Interval Evaluation  Date of Service: 07/18/22 Patient Name: Maureen Key Patient MRN: [id number] Patient DOB: 07-Jul-1981 Provider: Ventura Sellers, MD  Identifying Statement:  Maureen Key is a 42 y.o. 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 Zada Finders.  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  IDH 1/2 Mutated.  1p/19q co-deleted  TERT Unknown   Interval History:  Maureen Key presents for follow up today after recent MRI brain.  She descibes new headache starting on 12/27; right sided pounding with photophobia, nausea.  She has no real history of migraines, though the holidays were stressful.  She has continued to experience the headache daily since that time.  No further issues with her leg, lower back has healed up.  Maintaining activity level at home and work.    H+P (12/20/19) Patient presented to medical attention after trauma, fall while roller skating and fracturing tibia one week ago.  This led to trauma evaluation and CNS imaging which demonstrated left frontal mass ~5cm.  She denies any neurologic symptoms, no headaches or seizures.  Currently in cast and wheelchair following tibial fixation over the weekend, healing well.   Medications: Current Outpatient Medications on File Prior to Visit  Medication Sig Dispense Refill   acetaminophen (TYLENOL) 500 MG tablet Take 1 tablet (500 mg total) by mouth every 6 (six) hours as needed. 30 tablet 0   cetirizine (ZYRTEC) 10 MG tablet Take 1 tablet (10 mg total) by mouth daily. 10 tablet 0   ibuprofen (ADVIL) 600 MG tablet Take 1 tablet (600 mg total) by mouth every 6  (six) hours as needed. (Patient not taking: Reported on 04/28/2022) 30 tablet 0   Vitamin D, Ergocalciferol, (DRISDOL) 1.25 MG (50000 UNIT) CAPS capsule Take 1 capsule (50,000 Units total) by mouth every 7 (seven) days. (Patient not taking: Reported on 04/28/2022) 8 capsule 0   No current facility-administered medications on file prior to visit.    Allergies: No Known Allergies Past Medical History:  Past Medical History:  Diagnosis Date   Blood transfusion without reported diagnosis    last [place] delivery   History of placenta accreta in prior pregnancy, currently pregnant    History of sepsis    Oligodendroglioma ([place])    Sickle cell trait ([place])    Past Surgical History:  Past Surgical History:  Procedure Laterality Date   ABDOMINAL HYSTERECTOMY  05/12/2017   Procedure: HYSTERECTOMY ABDOMINAL;  Surgeon: Jonnie Kind, MD;  Location: [facility];  Service: Obstetrics;;   APPLICATION OF CRANIAL NAVIGATION N/A 02/04/2020   Procedure: APPLICATION OF CRANIAL NAVIGATION;  Surgeon: Judith Part, MD;  Location: [facility];  Service: Neurosurgery;  Laterality: N/A;  APPLICATION OF CRANIAL NAVIGATION   CERVICAL CONE BIOPSY     CESAREAN SECTION     CESAREAN SECTION N/A 05/12/2017   Procedure: CESAREAN SECTION WITH Hysterectomy;  Surgeon: Jonnie Kind, MD;  Location: [facility];  Service: Obstetrics;  Laterality: N/A;   CRANIOTOMY Left 02/04/2020   Procedure: LEFT CRANIOTOMY FOR TUMOR RESECTION;  Surgeon: Judith Part, MD;  Location: [facility];  Service: Neurosurgery;  Laterality: Left;  LEFT CRANIOTOMY FOR TUMOR RESECTION  TIBIA IM NAIL INSERTION Right 12/13/2019   Procedure: INTRAMEDULLARY (IM) NAIL TIBIAL;  Surgeon: Shona Needles, MD;  Location: [facility];  Service: Orthopedics;  Laterality: Right;   Social History:  Social History   Socioeconomic History   Marital status: Single    Spouse name: Not on file   Number of children: 4   Years of education: Not on file    Highest education level: Not on file  Occupational History   Not on file  Tobacco Use   Smoking status: Light Smoker    Packs/day: 0.25    Types: Cigarettes    Last attempt to quit: 03/08/2017    Years since quitting: 5.3   Smokeless tobacco: Never   Tobacco comments:    3 cigarettes a day  Vaping Use   Vaping Use: Never used  Substance and Sexual Activity   Alcohol use: No   Drug use: Not Currently    Types: Marijuana    Comment: last used 04/09/17   Sexual activity: Yes    Birth control/protection: None, Surgical  Other Topics Concern   Not on file  Social History Narrative   Not on file   Social Determinants of Health   Financial Resource Strain: Low Risk  (11/11/2021)   Overall Financial Resource Strain (CARDIA)    Difficulty of Paying Living Expenses: Not very hard  Food Insecurity: Food Insecurity Present (11/11/2021)   Hunger Vital Sign    Worried About Running Out of Food in the Last Year: Sometimes true    Ran Out of Food in the Last Year: Sometimes true  Transportation Needs: No Transportation Needs (11/11/2021)   PRAPARE - Hydrologist (Medical): No    Lack of Transportation (Non-Medical): No  Physical Activity: Sufficiently Active (11/11/2021)   Exercise Vital Sign    Days of Exercise per Week: 2 days    Minutes of Exercise per Session: 100 min  Stress: No Stress Concern Present (11/11/2021)   [place]    Feeling of Stress : Not at all  Social Connections: Moderately Isolated (11/11/2021)   Social Connection and Isolation Panel [NHANES]    Frequency of Communication with Friends and Family: Three times a week    Frequency of Social Gatherings with Friends and Family: Three times a week    Attends Religious Services: Never    Active Member of Clubs or Organizations: No    Attends Archivist Meetings: Never    Marital Status: Living with partner  Intimate  Partner Violence: Not At Risk (11/11/2021)   Humiliation, Afraid, Rape, and Kick questionnaire    Fear of Current or Ex-Partner: No    Emotionally Abused: No    Physically Abused: No    Sexually Abused: No   Family History:  Family History  Problem Relation Age of Onset   Hypertension Mother    HIV/AIDS Father    Cancer Father        lung   Diabetes Sister    Diabetes Maternal Aunt    Diabetes Maternal Uncle    Cancer Paternal Aunt    Cancer Paternal Uncle    Diabetes Maternal Grandmother    Alzheimer's disease Paternal Grandmother     Review of Systems: Constitutional: Doesn't report fevers, chills or abnormal weight loss Eyes: Doesn't report blurriness of vision Ears, nose, mouth, throat, and face: Doesn't report sore throat Respiratory: Doesn't report cough, dyspnea or wheezes Cardiovascular: Doesn't report  palpitation, chest discomfort  Gastrointestinal:  Doesn't report nausea, constipation, diarrhea GU: Doesn't report incontinence Skin: Doesn't report skin rashes Neurological: Per HPI Musculoskeletal: Doesn't report joint pain Behavioral/Psych: Doesn't report anxiety  Physical Exam: Vitals:   07/18/22 1050  BP: 137/85  Pulse: 78  Resp: 20  Temp: (!) 97.5 F (36.4 C)  SpO2: 100%    KPS: 90. General: Alert, cooperative, pleasant, in no acute distress Head: Normal EENT: No conjunctival injection or scleral icterus.  Lungs: Resp effort normal Cardiac: Regular rate Abdomen: Non-distended abdomen Skin: No rashes cyanosis or petechiae. Extremities: No clubbing or edema  Neurologic Exam: Mental Status: Awake, alert, attentive to examiner. Oriented to self and environment. Language is fluent with intact comprehension.  Cranial Nerves: Visual acuity is grossly normal. Visual fields are full. Extra-ocular movements intact. No ptosis. Face is symmetric Motor: Tone and bulk are normal. Power is full in both arms and legs. Reflexes are symmetric, no pathologic  reflexes present.  Sensory: Intact to light touch Gait: Orthopedic limitation only  Labs: I have reviewed the data as listed    Component Value Date/Time   NA 138 04/28/2022 0915   NA 143 01/03/2018 0949   K 3.2 (L) 04/28/2022 0915   CL 108 04/28/2022 0915   CO2 22 04/28/2022 0915   GLUCOSE 148 (H) 04/28/2022 0915   BUN 13 04/28/2022 0915   BUN 14 01/03/2018 0949   CREATININE 0.73 04/28/2022 0915   CALCIUM 9.2 04/28/2022 0915   PROT 7.6 12/12/2019 2340   PROT 7.6 01/03/2018 0949   ALBUMIN 4.5 12/12/2019 2340   ALBUMIN 4.8 01/03/2018 0949   AST 20 12/12/2019 2340   ALT 22 12/12/2019 2340   ALKPHOS 72 12/12/2019 2340   BILITOT 0.2 (L) 12/12/2019 2340   BILITOT 0.4 01/03/2018 0949   GFRNONAA >60 04/28/2022 0915   GFRAA >60 01/31/2020 1340   Lab Results  Component Value Date   WBC 7.7 04/28/2022   NEUTROABS 6.4 04/28/2022   HGB 12.9 04/28/2022   HCT 36.8 04/28/2022   MCV 93.4 04/28/2022   PLT 200 04/28/2022    Imaging:  [facility] Clinician Interpretation: I have personally reviewed the CNS images as listed.  My interpretation, in the context of the patient's clinical presentation, is stable disease  MR Brain W Wo Contrast  Result Date: 07/18/2022 CLINICAL DATA:  Brain/CNS neoplasm, monitor EXAM: MRI HEAD WITHOUT AND WITH CONTRAST TECHNIQUE: Multiplanar, multiecho pulse sequences of the brain and surrounding structures were obtained without and with intravenous contrast. CONTRAST:  57m GADAVIST GADOBUTROL 1 MMOL/ML IV SOLN COMPARISON:  MRI head April 28, 2022. FINDINGS: Brain: Similar appearance of high left frontal lobe encephalomalacia. No masslike enhancement this region to suggest residual/recurrent disease. Surrounding T2/FLAIR hyperintensity is unchanged. No evidence of acute hemorrhage, acute infarct, midline shift or hydrocephalus. Vascular: Major arterial flow voids are maintained at the skull base. Skull and upper cervical spine: Left frontal craniotomy. Otherwise,  normal marrow signal. Sinuses/Orbits: Mild paranasal sinus mucosal thickening. No acute orbital findings. Other: No mastoid effusions. IMPRESSION: No evidence of acute intracranial abnormality or residual/recurrent tumor. Similar appearance of left frontal and surrounding T2/FLAIR hyperintensity. Electronically Signed   By: FMargaretha SheffieldM.D.   On: 07/18/2022 06:53     Assessment/Plan Oligodendroglioma, IDH gene mutant and 1p/19q-codeleted ([place]  Maureen Key is clinically stable and radiographically stable today, from oncologic standpoint.  We recommended continuing serial MRI monitoring at this time.  For acute migraine, recommended Ibuprofen '800mg'$  or Naproxen '550mg'$  PRN with  early HA symptoms.  In addition we will prescribed 7 day course of prednisone '50mg'$  daily.  Prior to this had just taken Tylenol several hours into the pain.  We ask that Jette return to clinic in 6 months following next brain MRI, or sooner as needed.  All questions were answered. The patient knows to call the clinic with any problems, questions or concerns. No barriers to learning were detected.  I have spent a total of 30 minutes of face-to-face and non-face-to-face time, excluding clinical staff time, preparing to see patient, ordering tests and/or medications, counseling the patient, and independently interpreting results and communicating results to the patient/family/caregiver    Ventura Sellers, MD Medical Director of Neuro-Oncology [facility] at [place] 07/18/22 10:48 AM

## 2022-07-19 ENCOUNTER — Telehealth: Payer: Self-pay | Admitting: Internal Medicine

## 2022-07-19 NOTE — Telephone Encounter (Signed)
Called patient to notify of new appointment. Patient notified.  

## 2022-07-25 ENCOUNTER — Other Ambulatory Visit: Payer: Self-pay | Admitting: Radiation Therapy

## 2022-11-03 ENCOUNTER — Ambulatory Visit (INDEPENDENT_AMBULATORY_CARE_PROVIDER_SITE_OTHER): Payer: Medicare Other | Admitting: Family Medicine

## 2022-11-03 ENCOUNTER — Encounter: Payer: Self-pay | Admitting: Family Medicine

## 2022-11-03 ENCOUNTER — Other Ambulatory Visit: Payer: Self-pay | Admitting: Family Medicine

## 2022-11-03 VITALS — BP 112/76 | HR 76 | Temp 97.6°F | Ht 60.0 in | Wt 117.0 lb

## 2022-11-03 DIAGNOSIS — R627 Adult failure to thrive: Secondary | ICD-10-CM

## 2022-11-03 DIAGNOSIS — E876 Hypokalemia: Secondary | ICD-10-CM

## 2022-11-03 DIAGNOSIS — Z1231 Encounter for screening mammogram for malignant neoplasm of breast: Secondary | ICD-10-CM

## 2022-11-03 DIAGNOSIS — R5383 Other fatigue: Secondary | ICD-10-CM | POA: Diagnosis not present

## 2022-11-03 DIAGNOSIS — Z90711 Acquired absence of uterus with remaining cervical stump: Secondary | ICD-10-CM | POA: Diagnosis not present

## 2022-11-03 DIAGNOSIS — E559 Vitamin D deficiency, unspecified: Secondary | ICD-10-CM

## 2022-11-03 DIAGNOSIS — R61 Generalized hyperhidrosis: Secondary | ICD-10-CM | POA: Diagnosis not present

## 2022-11-03 LAB — COMPREHENSIVE METABOLIC PANEL
ALT: 15 U/L (ref 0–35)
AST: 17 U/L (ref 0–37)
Albumin: 4.7 g/dL (ref 3.5–5.2)
Alkaline Phosphatase: 72 U/L (ref 39–117)
BUN: 11 mg/dL (ref 6–23)
CO2: 28 mEq/L (ref 19–32)
Calcium: 9.8 mg/dL (ref 8.4–10.5)
Chloride: 105 mEq/L (ref 96–112)
Creatinine, Ser: 0.75 mg/dL (ref 0.40–1.20)
GFR: 99.72 mL/min (ref >60.00)
Glucose, Bld: 80 mg/dL (ref 70–99)
Potassium: 4.4 mEq/L (ref 3.5–5.1)
Sodium: 139 mEq/L (ref 135–145)
Total Bilirubin: 0.3 mg/dL (ref 0.2–1.2)
Total Protein: 7.9 g/dL (ref 6.0–8.3)

## 2022-11-03 LAB — CBC WITH DIFFERENTIAL/PLATELET
Basophils Absolute: 0 10*3/uL (ref 0.0–0.1)
Basophils Relative: 0.9 % (ref 0.0–3.0)
Eosinophils Absolute: 0.1 10*3/uL (ref 0.0–0.7)
Eosinophils Relative: 3.1 % (ref 0.0–5.0)
HCT: 41.6 % (ref 36.0–46.0)
Hemoglobin: 14.2 g/dL (ref 12.0–15.0)
Lymphocytes Relative: 39.8 % (ref 12.0–46.0)
Lymphs Abs: 1.6 10*3/uL (ref 0.7–4.0)
MCHC: 34.2 g/dL (ref 30.0–36.0)
MCV: 98.6 fl (ref 78.0–100.0)
Monocytes Absolute: 0.4 10*3/uL (ref 0.1–1.0)
Monocytes Relative: 11 % (ref 3.0–12.0)
Neutro Abs: 1.9 10*3/uL (ref 1.4–7.7)
Neutrophils Relative %: 45.2 % (ref 43.0–77.0)
Platelets: 219 10*3/uL (ref 150.0–400.0)
RBC: 4.22 Mil/uL (ref 3.87–5.11)
RDW: 13.6 % (ref 11.5–15.5)
WBC: 4.1 10*3/uL (ref 4.0–10.5)

## 2022-11-03 LAB — VITAMIN B12: Vitamin B-12: 435 pg/mL (ref 211–911)

## 2022-11-03 LAB — FOLATE: Folate: 7.5 ng/mL (ref >5.9)

## 2022-11-03 LAB — VITAMIN D 25 HYDROXY (VIT D DEFICIENCY, FRACTURES): VITD: 8.97 ng/mL — ABNORMAL LOW (ref 30.00–100.00)

## 2022-11-03 LAB — T4, FREE: Free T4: 0.71 ng/dL (ref 0.60–1.60)

## 2022-11-03 LAB — TSH: TSH: 2.14 u[IU]/mL (ref 0.35–5.50)

## 2022-11-03 MED ORDER — VITAMIN D (ERGOCALCIFEROL) 1.25 MG (50000 UNIT) PO CAPS
50000.0000 [IU] | ORAL_CAPSULE | ORAL | 2 refills | Status: DC
Start: 2022-11-03 — End: 2022-12-02

## 2022-11-03 NOTE — Patient Instructions (Signed)
Please go downstairs for labs before you leave.   Increase your calories. You can add a once or twice daily Boost, Ensure or some other type of protein and calorie beverage.   You can call [facility] to schedule your first mammogram. [phone]  Follow up in 4-6 weeks.

## 2022-11-03 NOTE — Progress Notes (Signed)
New Patient Office Visit  Subjective    Patient ID: Maureen Key, female    DOB: 07/21/1980  Age: 42 y.o. MRN: [id number]  CC:  Chief Complaint  Patient presents with   Establish Care    Worried about eating habits, cooks all the time but isnt hungry. Weight is fluctuating     HPI Maureen Key presents to establish care No PCP recently.   OB/GYN- Dr. Sallyanne Havers- [facility] in [place]   Being followed by oncologist. Brain mass removed.  States her oncologist took her out of work and she is on disability. She wants to go back to work.   States she does not eat breakfast. Lunch she only has a snack. Supper she eats a normal meal.   Appetite is good but when she gets food or cooks food then she isn't able to eat.   Fatigue.  Night sweats.  Had a hysterectomy   4 kids.   Outpatient Encounter Medications as of 11/03/2022  Medication Sig   acetaminophen (TYLENOL) 500 MG tablet Take 1 tablet (500 mg total) by mouth every 6 (six) hours as needed.   cetirizine (ZYRTEC) 10 MG tablet Take 1 tablet (10 mg total) by mouth daily.   Vitamin D, Ergocalciferol, (DRISDOL) 1.25 MG (50000 UNIT) CAPS capsule Take 1 capsule (50,000 Units total) by mouth every 7 (seven) days.   [DISCONTINUED] ibuprofen (ADVIL) 600 MG tablet Take 1 tablet (600 mg total) by mouth every 6 (six) hours as needed. (Patient not taking: Reported on 04/28/2022)   [DISCONTINUED] predniSONE (DELTASONE) 50 MG tablet Take 1 tablet (50 mg total) by mouth daily with breakfast.   No facility-administered encounter medications on file as of 11/03/2022.    Past Medical History:  Diagnosis Date   Anxiety 2008   Blood transfusion without reported diagnosis    last PPH delivery   History of placenta accreta in prior pregnancy, currently pregnant    History of sepsis    Oligodendroglioma (HCC)    Sickle cell trait (HCC)     Past Surgical History:  Procedure Laterality Date   ABDOMINAL HYSTERECTOMY   05/12/2017   Procedure: HYSTERECTOMY ABDOMINAL;  Surgeon: Tilda Burrow, MD;  Location: [facility]IRTHING SUITES;  Service: Obstetrics;;   APPLICATION OF CRANIAL NAVIGATION N/A 02/04/2020   Procedure: APPLICATION OF CRANIAL NAVIGATION;  Surgeon: Jadene Pierini, MD;  Location: [facility] OR;  Service: Neurosurgery;  Laterality: N/A;  APPLICATION OF CRANIAL NAVIGATION   BRAIN SURGERY  01/05/2020   CERVICAL CONE BIOPSY     CESAREAN SECTION     CESAREAN SECTION N/A 05/12/2017   Procedure: CESAREAN SECTION WITH Hysterectomy;  Surgeon: Tilda Burrow, MD;  Location: [facility] BIRTHING SUITES;  Service: Obstetrics;  Laterality: N/A;   CRANIOTOMY Left 02/04/2020   Procedure: LEFT CRANIOTOMY FOR TUMOR RESECTION;  Surgeon: Jadene Pierini, MD;  Location: [facility] OR;  Service: Neurosurgery;  Laterality: Left;  LEFT CRANIOTOMY FOR TUMOR RESECTION   TIBIA IM NAIL INSERTION Right 12/13/2019   Procedure: INTRAMEDULLARY (IM) NAIL TIBIAL;  Surgeon: Roby Lofts, MD;  Location: [facility] OR;  Service: Orthopedics;  Laterality: Right;    Family History  Problem Relation Age of Onset   Hypertension Mother    HIV/AIDS Father    Cancer Father        lung   Diabetes Sister    Diabetes Maternal Aunt    Diabetes Maternal Uncle    Cancer Paternal Aunt    Cancer Paternal Uncle  Diabetes Maternal Grandmother    Alzheimer's disease Paternal Grandmother     Social History   Socioeconomic History   Marital status: Single    Spouse name: Not on file   Number of children: 4   Years of education: Not on file   Highest education level: Not on file  Occupational History   Not on file  Tobacco Use   Smoking status: Former    Packs/day: 0.25    Years: 15.00    Additional pack years: 0.00    Total pack years: 3.75    Types: Cigarettes    Quit date: 03/08/2017    Years since quitting: 5.6   Smokeless tobacco: Never   Tobacco comments:    3 cigarettes a day  Vaping Use   Vaping Use: Never used  Substance and Sexual  Activity   Alcohol use: Not Currently    Alcohol/week: 1.0 standard drink of alcohol    Types: 1 Glasses of wine per week    Comment: On occasions   Drug use: Yes    Frequency: 2.0 times per week    Types: Marijuana    Comment: last used 04/09/17   Sexual activity: Yes    Birth control/protection: Condom, None  Other Topics Concern   Not on file  Social History Narrative   Not on file   Social Determinants of Health   Financial Resource Strain: Low Risk  (11/11/2021)   Overall Financial Resource Strain (CARDIA)    Difficulty of Paying Living Expenses: Not very hard  Food Insecurity: Food Insecurity Present (11/11/2021)   Hunger Vital Sign    Worried About Running Out of Food in the Last Year: Sometimes true    Ran Out of Food in the Last Year: Sometimes true  Transportation Needs: No Transportation Needs (11/11/2021)   PRAPARE - Administrator, Civil Service (Medical): No    Lack of Transportation (Non-Medical): No  Physical Activity: Sufficiently Active (11/11/2021)   Exercise Vital Sign    Days of Exercise per Week: 2 days    Minutes of Exercise per Session: 100 min  Stress: No Stress Concern Present (11/11/2021)   [employer] of Occupational Health - Occupational Stress Questionnaire    Feeling of Stress : Not at all  Social Connections: Moderately Isolated (11/11/2021)   Social Connection and Isolation Panel [NHANES]    Frequency of Communication with Friends and Family: Three times a week    Frequency of Social Gatherings with Friends and Family: Three times a week    Attends Religious Services: Never    Active Member of Clubs or Organizations: No    Attends Banker Meetings: Never    Marital Status: Living with partner  Intimate Partner Violence: Not At Risk (11/11/2021)   Humiliation, Afraid, Rape, and Kick questionnaire    Fear of Current or Ex-Partner: No    Emotionally Abused: No    Physically Abused: No    Sexually Abused: No    Review of  Systems  Constitutional:  Positive for malaise/fatigue and weight loss. Negative for chills and fever.       Night sweats  Respiratory:  Negative for cough and shortness of breath.   Cardiovascular:  Negative for chest pain, palpitations and leg swelling.  Gastrointestinal:  Negative for abdominal pain, blood in stool, constipation, diarrhea, nausea and vomiting.  Genitourinary:  Negative for dysuria, frequency and urgency.  Musculoskeletal:  Negative for joint pain and myalgias.  Neurological:  Negative for dizziness  and weakness.        Objective    BP 112/76 (BP Location: Left Arm, Patient Position: Sitting, Cuff Size: Normal)   Pulse 76   Temp 97.6 F (36.4 C) (Temporal)   Ht 5' (1.524 m)   Wt 117 lb (53.1 kg)   LMP 08/05/2016   SpO2 97%   BMI 22.85 kg/m   Physical Exam Constitutional:      General: She is not in acute distress.    Appearance: She is not ill-appearing.  Eyes:     Extraocular Movements: Extraocular movements intact.     Conjunctiva/sclera: Conjunctivae normal.  Cardiovascular:     Rate and Rhythm: Normal rate and regular rhythm.  Pulmonary:     Effort: Pulmonary effort is normal.     Breath sounds: Normal breath sounds.  Musculoskeletal:     Cervical back: Normal range of motion and neck supple.  Skin:    General: Skin is warm and dry.  Neurological:     General: No focal deficit present.     Mental Status: She is alert and oriented to person, place, and time.  Psychiatric:        Mood and Affect: Mood normal.        Behavior: Behavior normal.        Thought Content: Thought content normal.         Assessment & Plan:   Problem List Items Addressed This Visit       Other   Fatigue   Relevant Orders   CBC with Differential/Platelet (Completed)   Comprehensive metabolic panel (Completed)   TSH (Completed)   T4, free (Completed)   VITAMIN D 25 Hydroxy (Vit-D Deficiency, Fractures) (Completed)   Vitamin B12 (Completed)   Folate  (Completed)   Iron, TIBC and Ferritin Panel   H/O abdominal supracervical subtotal hysterectomy   Hypokalemia   Relevant Orders   Comprehensive metabolic panel (Completed)   Night sweats   Relevant Orders   CBC with Differential/Platelet (Completed)   Comprehensive metabolic panel (Completed)   TSH (Completed)   T4, free (Completed)   Poor weight gain in adult - Primary   Relevant Orders   CBC with Differential/Platelet (Completed)   Comprehensive metabolic panel (Completed)   TSH (Completed)   T4, free (Completed)   Vitamin D deficiency   Relevant Orders   VITAMIN D 25 Hydroxy (Vit-D Deficiency, Fractures) (Completed)   Other Visit Diagnoses     Screening mammogram for breast cancer       Relevant Orders   MM Digital Screening      She is a pleasant 42 year old female who is here to establish care. Screen labs to llo for underlying etiology for fatigue and night sweats.  Weight has been fairly stable per EMR. Discussed that she is most likely not getting enough calories to gain weight. Recommend increasing calories and may add a Boost, Ensure or protein shake to help with this.  She will call and schedule mammogram. See oncologist and OB/GYN as recommended.  Check labs and follow up here in 4-6 wks.   Return for f/u 4-6 wks for weight .   Hetty Blend, NP-C

## 2022-11-04 LAB — IRON,TIBC AND FERRITIN PANEL
%SAT: 21 % (calc) (ref 16–45)
Ferritin: 29 ng/mL (ref 16–154)
Iron: 67 ug/dL (ref 40–190)
TIBC: 314 mcg/dL (calc) (ref 250–450)

## 2022-11-08 ENCOUNTER — Ambulatory Visit
Admission: RE | Admit: 2022-11-08 | Discharge: 2022-11-08 | Disposition: A | Discharge status: Home / Self Care | Payer: Medicare Other | Source: Ambulatory Visit | Attending: Family Medicine | Admitting: Family Medicine

## 2022-11-08 DIAGNOSIS — Z1231 Encounter for screening mammogram for malignant neoplasm of breast: Secondary | ICD-10-CM

## 2022-12-02 ENCOUNTER — Other Ambulatory Visit: Payer: Self-pay | Admitting: Family Medicine

## 2022-12-02 ENCOUNTER — Encounter: Payer: Self-pay | Admitting: Family Medicine

## 2022-12-02 ENCOUNTER — Ambulatory Visit (INDEPENDENT_AMBULATORY_CARE_PROVIDER_SITE_OTHER): Payer: Medicare Other | Admitting: Family Medicine

## 2022-12-02 VITALS — BP 126/84 | HR 64 | Temp 97.6°F | Ht 60.0 in | Wt 115.0 lb

## 2022-12-02 DIAGNOSIS — E559 Vitamin D deficiency, unspecified: Secondary | ICD-10-CM

## 2022-12-02 DIAGNOSIS — R627 Adult failure to thrive: Secondary | ICD-10-CM

## 2022-12-02 LAB — VITAMIN D 25 HYDROXY (VIT D DEFICIENCY, FRACTURES): VITD: 10.84 ng/mL — ABNORMAL LOW (ref 30.00–100.00)

## 2022-12-02 MED ORDER — VITAMIN D (ERGOCALCIFEROL) 1.25 MG (50000 UNIT) PO CAPS
50000.0000 [IU] | ORAL_CAPSULE | ORAL | 2 refills | Status: DC
Start: 2022-12-02 — End: 2023-03-07

## 2022-12-02 NOTE — Progress Notes (Signed)
Subjective:     Patient ID: Maureen Key, female    DOB: 02/24/1981, 42 y.o.   MRN: [id number]  Chief Complaint  Patient presents with   Weight Check    HPI Patient is in today for follow up on weight and vitamin D deficiency.   At her last visit she was concerned about not being able to gain weight.  States she had a GI bug with diarrhea 2 days ago.  Drinking a protein shake. Eating breakfast now.   Vitamin D def- states she took 7 prescription tablets daily for 7 days. She is now taking OTC vitamin D3  No new concerns.   Mammogram done and negative.   Health Maintenance Due  Topic Date Due   Medicare Annual Wellness (AWV)  Never done   Hepatitis C Screening  Never done    Past Medical History:  Diagnosis Date   Anxiety 2008   Blood transfusion without reported diagnosis    last PPH delivery   History of placenta accreta in prior pregnancy, currently pregnant    History of sepsis    Oligodendroglioma (HCC)    Sickle cell trait (HCC)     Past Surgical History:  Procedure Laterality Date   ABDOMINAL HYSTERECTOMY  05/12/2017   Procedure: HYSTERECTOMY ABDOMINAL;  Surgeon: Tilda Burrow, MD;  Location: [facility] BIRTHING SUITES;  Service: Obstetrics;;   APPLICATION OF CRANIAL NAVIGATION N/A 02/04/2020   Procedure: APPLICATION OF CRANIAL NAVIGATION;  Surgeon: Jadene Pierini, MD;  Location: [facility] OR;  Service: Neurosurgery;  Laterality: N/A;  APPLICATION OF CRANIAL NAVIGATION   BRAIN SURGERY  01/05/2020   CERVICAL CONE BIOPSY     CESAREAN SECTION     CESAREAN SECTION N/A 05/12/2017   Procedure: CESAREAN SECTION WITH Hysterectomy;  Surgeon: Tilda Burrow, MD;  Location: [facility] BIRTHING SUITES;  Service: Obstetrics;  Laterality: N/A;   CRANIOTOMY Left 02/04/2020   Procedure: LEFT CRANIOTOMY FOR TUMOR RESECTION;  Surgeon: Jadene Pierini, MD;  Location: [facility] OR;  Service: Neurosurgery;  Laterality: Left;  LEFT CRANIOTOMY FOR TUMOR RESECTION   TIBIA IM NAIL  INSERTION Right 12/13/2019   Procedure: INTRAMEDULLARY (IM) NAIL TIBIAL;  Surgeon: Roby Lofts, MD;  Location: [facility] OR;  Service: Orthopedics;  Laterality: Right;    Family History  Problem Relation Age of Onset   Hypertension Mother    HIV/AIDS Father    Cancer Father        lung   Diabetes Sister    Diabetes Maternal Aunt    Diabetes Maternal Uncle    Cancer Paternal Aunt    Cancer Paternal Uncle    Diabetes Maternal Grandmother    Alzheimer's disease Paternal Grandmother     Social History   Socioeconomic History   Marital status: Single    Spouse name: Not on file   Number of children: 4   Years of education: Not on file   Highest education level: 12th grade  Occupational History   Not on file  Tobacco Use   Smoking status: Former    Packs/day: 0.25    Years: 15.00    Additional pack years: 0.00    Total pack years: 3.75    Types: Cigarettes    Quit date: 03/08/2017    Years since quitting: 5.7   Smokeless tobacco: Never   Tobacco comments:    3 cigarettes a day  Vaping Use   Vaping Use: Never used  Substance and Sexual Activity   Alcohol use: Not Currently  Alcohol/week: 1.0 standard drink of alcohol    Types: 1 Glasses of wine per week    Comment: On occasions   Drug use: Yes    Frequency: 2.0 times per week    Types: Marijuana    Comment: last used 04/09/17   Sexual activity: Yes    Birth control/protection: Condom, None  Other Topics Concern   Not on file  Social History Narrative   Not on file   Social Determinants of Health   Financial Resource Strain: Low Risk  (11/28/2022)   Overall Financial Resource Strain (CARDIA)    Difficulty of Paying Living Expenses: Not very hard  Food Insecurity: Food Insecurity Present (11/28/2022)   Hunger Vital Sign    Worried About Running Out of Food in the Last Year: Sometimes true    Ran Out of Food in the Last Year: Sometimes true  Transportation Needs: No Transportation Needs (11/28/2022)   PRAPARE -  Administrator, Civil Service (Medical): No    Lack of Transportation (Non-Medical): No  Physical Activity: Sufficiently Active (11/28/2022)   Exercise Vital Sign    Days of Exercise per Week: 5 days    Minutes of Exercise per Session: 30 min  Stress: No Stress Concern Present (11/28/2022)   [employer] of Occupational Health - Occupational Stress Questionnaire    Feeling of Stress : Only a little  Social Connections: Moderately Integrated (11/28/2022)   Social Connection and Isolation Panel [NHANES]    Frequency of Communication with Friends and Family: Three times a week    Frequency of Social Gatherings with Friends and Family: Once a week    Attends Religious Services: 1 to 4 times per year    Active Member of [employer] or Organizations: No    Attends Engineer, structural: Not on file    Marital Status: Living with partner  Intimate Partner Violence: Not At Risk (11/11/2021)   Humiliation, Afraid, Rape, and Kick questionnaire    Fear of Current or Ex-Partner: No    Emotionally Abused: No    Physically Abused: No    Sexually Abused: No    Outpatient Medications Prior to Visit  Medication Sig Dispense Refill   acetaminophen (TYLENOL) 500 MG tablet Take 1 tablet (500 mg total) by mouth every 6 (six) hours as needed. 30 tablet 0   cetirizine (ZYRTEC) 10 MG tablet Take 1 tablet (10 mg total) by mouth daily. 10 tablet 0   VITAMIN D PO Take by mouth daily. OTC     Vitamin D, Ergocalciferol, (DRISDOL) 1.25 MG (50000 UNIT) CAPS capsule Take 1 capsule (50,000 Units total) by mouth every 7 (seven) days. (Patient not taking: Reported on 12/02/2022) 4 capsule 2   No facility-administered medications prior to visit.    No Known Allergies  Review of Systems  Constitutional:  Positive for malaise/fatigue and weight loss. Negative for chills and fever.  Respiratory:  Negative for shortness of breath.   Cardiovascular:  Negative for chest pain, palpitations and leg  swelling.  Gastrointestinal:  Positive for diarrhea. Negative for abdominal pain, constipation, nausea and vomiting.  Genitourinary:  Negative for dysuria, frequency and urgency.  Neurological:  Negative for dizziness and focal weakness.  Psychiatric/Behavioral:  Negative for depression. The patient is not nervous/anxious.        Objective:    Physical Exam Constitutional:      General: She is not in acute distress.    Appearance: She is not ill-appearing.  Eyes:  Extraocular Movements: Extraocular movements intact.     Conjunctiva/sclera: Conjunctivae normal.  Cardiovascular:     Rate and Rhythm: Normal rate.  Pulmonary:     Effort: Pulmonary effort is normal.  Musculoskeletal:     Cervical back: Normal range of motion and neck supple.  Skin:    General: Skin is warm and dry.  Neurological:     General: No focal deficit present.     Mental Status: She is alert and oriented to person, place, and time.  Psychiatric:        Mood and Affect: Mood normal.        Behavior: Behavior normal.        Thought Content: Thought content normal.     BP 126/84 (BP Location: Left Arm, Patient Position: Sitting, Cuff Size: Normal)   Pulse 64   Temp 97.6 F (36.4 C) (Temporal)   Ht 5' (1.524 m)   Wt 115 lb (52.2 kg)   LMP 08/05/2016   SpO2 99%   BMI 22.46 kg/m  Wt Readings from Last 3 Encounters:  12/02/22 115 lb (52.2 kg)  11/03/22 117 lb (53.1 kg)  07/18/22 121 lb (54.9 kg)       Assessment & Plan:   Problem List Items Addressed This Visit       Other   Poor weight gain in adult   Vitamin D deficiency - Primary   Relevant Orders   VITAMIN D 25 Hydroxy (Vit-D Deficiency, Fractures)   Her weight is fairly stable. She did have a viral GI illness 2 days ago which has resolved.  Continue to monitor and update all cancer screenings.  Mammogram negative.  She will schedule with her OB/GYN.  Consider GI referral if she loses any significant weight in the next few months.   Recheck vitamin D level. She took 7 doses in 7 days. Now on OTC D3.  Follow up pending result.   I have discontinued Hortense A. Lamoureaux's Vitamin D (Ergocalciferol). I am also having her maintain her acetaminophen, cetirizine, and VITAMIN D PO.  No orders of the defined types were placed in this encounter.

## 2022-12-02 NOTE — Patient Instructions (Signed)
Please go downstairs for labs to recheck your vitamin D level.   Schedule with your OB/GYN for a wellness check.   Follow up in 3 months or sooner if you are losing weight (5 lbs or more in one week).

## 2022-12-27 ENCOUNTER — Other Ambulatory Visit: Payer: Self-pay | Admitting: Internal Medicine

## 2022-12-29 ENCOUNTER — Ambulatory Visit (HOSPITAL_COMMUNITY)
Admission: RE | Admit: 2022-12-29 | Discharge: 2022-12-29 | Disposition: A | Discharge status: Home / Self Care | Payer: Medicare Other | Source: Ambulatory Visit | Attending: Internal Medicine | Admitting: Internal Medicine

## 2022-12-29 DIAGNOSIS — C719 Malignant neoplasm of brain, unspecified: Secondary | ICD-10-CM

## 2022-12-29 MED ORDER — GADOBUTROL 1 MMOL/ML IV SOLN
5.0000 mL | Freq: Once | INTRAVENOUS | Status: AC | PRN
  Administered 2022-12-29: 5 mL via INTRAVENOUS

## 2023-01-09 ENCOUNTER — Other Ambulatory Visit: Payer: Self-pay

## 2023-01-09 ENCOUNTER — Other Ambulatory Visit: Payer: Self-pay | Admitting: Radiation Therapy

## 2023-01-09 ENCOUNTER — Inpatient Hospital Stay: Payer: Medicare Other | Attending: Internal Medicine | Admitting: Internal Medicine

## 2023-01-09 VITALS — BP 111/81 | HR 67 | Temp 97.7°F | Resp 13 | Wt 118.9 lb

## 2023-01-09 DIAGNOSIS — C719 Malignant neoplasm of brain, unspecified: Secondary | ICD-10-CM

## 2023-01-09 MED ORDER — PROPRANOLOL HCL 20 MG PO TABS: 20.0000 mg | ORAL_TABLET | Freq: Three times a day (TID) | ORAL | 3 refills | Status: DC

## 2023-01-09 NOTE — Progress Notes (Signed)
[facility] at [facility] [address]  [place], [place] [address] [phone]   Interval Evaluation  Date of Service: 01/09/23 Patient Name: Maureen Key Patient MRN: [id number] Patient DOB: May 15, 1981 Provider: Henreitta Leber, MD  Identifying Statement:  Maureen Key is a 42 y.o. 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 Maurice Small.  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  IDH 1/2 Mutated.  1p/19q co-deleted  TERT Unknown   Interval History:  Maureen Key presents for follow up today after recent MRI brain.  She continues to describe migraine-type headaches, frequency is now 2x per week.  She doses Tylenol which is ineffective.  Back at work now, but struggling with short term memory and decision making.  Maintaining activity level at home and work.    H+P (12/20/19) Patient presented to medical attention after trauma, fall while roller skating and fracturing tibia one week ago.  This led to trauma evaluation and CNS imaging which demonstrated left frontal mass ~5cm.  She denies any neurologic symptoms, no headaches or seizures.  Currently in cast and wheelchair following tibial fixation over the weekend, healing well.   Medications: Current Outpatient Medications on File Prior to Visit  Medication Sig Dispense Refill   acetaminophen (TYLENOL) 500 MG tablet Take 1 tablet (500 mg total) by mouth every 6 (six) hours as needed. 30 tablet 0   cetirizine (ZYRTEC) 10 MG tablet Take 1 tablet (10 mg total) by mouth daily. 10 tablet 0   VITAMIN D PO Take by mouth daily. OTC     Vitamin D, Ergocalciferol, (DRISDOL) 1.25 MG (50000 UNIT) CAPS capsule Take 1 capsule (50,000 Units total) by mouth every 7 (seven)  days. ONCE WEEKLY 4 capsule 2   No current facility-administered medications on file prior to visit.    Allergies: No Known Allergies Past Medical History:  Past Medical History:  Diagnosis Date   Anxiety 2008   Blood transfusion without reported diagnosis    last PPH delivery   History of placenta accreta in prior pregnancy, currently pregnant    History of sepsis    Oligodendroglioma (HCC)    Sickle cell trait (HCC)    Past Surgical History:  Past Surgical History:  Procedure Laterality Date   ABDOMINAL HYSTERECTOMY  05/12/2017   Procedure: HYSTERECTOMY ABDOMINAL;  Surgeon: Tilda Burrow, MD;  Location: [facility] BIRTHING SUITES;  Service: Obstetrics;;   APPLICATION OF CRANIAL NAVIGATION N/A 02/04/2020   Procedure: APPLICATION OF CRANIAL NAVIGATION;  Surgeon: Jadene Pierini, MD;  [facility];  Service: Neurosurgery;  Laterality: N/A;  APPLICATION OF CRANIAL NAVIGATION   BRAIN SURGERY  01/05/2020   CERVICAL CONE BIOPSY     CESAREAN SECTION     CESAREAN SECTION N/A 05/12/2017   Procedure: CESAREAN SECTION WITH Hysterectomy;  Surgeon: Tilda Burrow, MD;  Location: [facility] BIRTHING SUITES;  Service: Obstetrics;  Laterality: N/A;   CRANIOTOMY Left 02/04/2020   Procedure: LEFT CRANIOTOMY FOR TUMOR RESECTION;  Surgeon: Jadene Pierini, MD;  Location: [facility] OR;  Service: Neurosurgery;  Laterality: Left;  LEFT CRANIOTOMY FOR TUMOR RESECTION   TIBIA IM NAIL INSERTION Right 12/13/2019   Procedure: INTRAMEDULLARY (IM) NAIL TIBIAL;  Surgeon: Roby Lofts, MD;  Location: [facility] OR;  Service: Orthopedics;  Laterality: Right;  Social History:  Social History   Socioeconomic History   Marital status: Single    Spouse name: Not on file   Number of children: 4   Years of education: Not on file   Highest education level: 12th grade  Occupational History   Not on file  Tobacco Use   Smoking status: Former    Packs/day: 0.25    Years: 15.00    Additional pack years: 0.00    Total  pack years: 3.75    Types: Cigarettes    Quit date: 03/08/2017    Years since quitting: 5.8   Smokeless tobacco: Never   Tobacco comments:    3 cigarettes a day  Vaping Use   Vaping Use: Never used  Substance and Sexual Activity   Alcohol use: Not Currently    Alcohol/week: 1.0 standard drink of alcohol    Types: 1 Glasses of wine per week    Comment: On occasions   Drug use: Yes    Frequency: 2.0 times per week    Types: Marijuana    Comment: last used 04/09/17   Sexual activity: Yes    Birth control/protection: Condom, None  Other Topics Concern   Not on file  Social History Narrative   Not on file   Social Determinants of Health   Financial Resource Strain: Low Risk  (11/28/2022)   Overall Financial Resource Strain (CARDIA)    Difficulty of Paying Living Expenses: Not very hard  Food Insecurity: Food Insecurity Present (11/28/2022)   Hunger Vital Sign    Worried About Running Out of Food in the Last Year: Sometimes true    Ran Out of Food in the Last Year: Sometimes true  Transportation Needs: No Transportation Needs (11/28/2022)   PRAPARE - Administrator, Civil Service (Medical): No    Lack of Transportation (Non-Medical): No  Physical Activity: Sufficiently Active (11/28/2022)   Exercise Vital Sign    Days of Exercise per Week: 5 days    Minutes of Exercise per Session: 30 min  Stress: No Stress Concern Present (11/28/2022)   Maureen Key    Feeling of Stress : Only a little  Social Connections: Moderately Integrated (11/28/2022)   Social Connection and Isolation Panel [NHANES]    Frequency of Communication with Friends and Family: Three times a week    Frequency of Social Gatherings with Friends and Family: Once a week    Attends Religious Services: 1 to 4 times per year    Active Member of [employer] or Organizations: No    Attends Engineer, structural: Not on file    Marital Status:  Living with partner  Intimate Partner Violence: Not At Risk (11/11/2021)   Humiliation, Afraid, Rape, and Kick Key    Fear of Current or Ex-Partner: No    Emotionally Abused: No    Physically Abused: No    Sexually Abused: No   Family History:  Family History  Problem Relation Age of Onset   Hypertension Mother    HIV/AIDS Father    Cancer Father        lung   Diabetes Sister    Diabetes Maternal Aunt    Diabetes Maternal Uncle    Cancer Paternal Aunt    Cancer Paternal Uncle    Diabetes Maternal Grandmother    Alzheimer's disease Paternal Grandmother     Review of Systems: Constitutional: Doesn't report fevers, chills or abnormal weight loss Eyes: Doesn't report  blurriness of vision Ears, nose, mouth, throat, and face: Doesn't report sore throat Respiratory: Doesn't report cough, dyspnea or wheezes Cardiovascular: Doesn't report palpitation, chest discomfort  Gastrointestinal:  Doesn't report nausea, constipation, diarrhea GU: Doesn't report incontinence Skin: Doesn't report skin rashes Neurological: Per HPI Musculoskeletal: Doesn't report joint pain Behavioral/Psych: Doesn't report anxiety  Physical Exam: Vitals:   01/09/23 1116  BP: 111/81  Pulse: 67  Resp: 13  Temp: 97.7 F (36.5 C)  SpO2: 100%     KPS: 80. General: Alert, cooperative, pleasant, in no acute distress Head: Normal EENT: No conjunctival injection or scleral icterus.  Lungs: Resp effort normal Cardiac: Regular rate Abdomen: Non-distended abdomen Skin: No rashes cyanosis or petechiae. Extremities: No clubbing or edema  Neurologic Exam: Mental Status: Awake, alert, attentive to examiner. Oriented to self and environment. Language is fluent with intact comprehension.  Impaired recall, age advanced psychomotor slowing Cranial Nerves: Visual acuity is grossly normal. Visual fields are full. Extra-ocular movements intact. No ptosis. Face is symmetric Motor: Tone and bulk are normal.  Power is full in both arms and legs. Reflexes are symmetric, no pathologic reflexes present.  Sensory: Intact to light touch Gait: Orthopedic limitation only  Labs: I have reviewed the data as listed    Component Value Date/Time   NA 139 11/03/2022 1424   NA 143 01/03/2018 0949   K 4.4 11/03/2022 1424   CL 105 11/03/2022 1424   CO2 28 11/03/2022 1424   GLUCOSE 80 11/03/2022 1424   BUN 11 11/03/2022 1424   BUN 14 01/03/2018 0949   CREATININE 0.75 11/03/2022 1424   CALCIUM 9.8 11/03/2022 1424   PROT 7.9 11/03/2022 1424   PROT 7.6 01/03/2018 0949   ALBUMIN 4.7 11/03/2022 1424   ALBUMIN 4.8 01/03/2018 0949   AST 17 11/03/2022 1424   ALT 15 11/03/2022 1424   ALKPHOS 72 11/03/2022 1424   BILITOT 0.3 11/03/2022 1424   BILITOT 0.4 01/03/2018 0949   GFRNONAA >60 04/28/2022 0915   GFRAA >60 01/31/2020 1340   Lab Results  Component Value Date   WBC 4.1 11/03/2022   NEUTROABS 1.9 11/03/2022   HGB 14.2 11/03/2022   HCT 41.6 11/03/2022   MCV 98.6 11/03/2022   PLT 219.0 11/03/2022    Imaging:  [facility] Clinician Interpretation: I have personally reviewed the CNS images as listed.  My interpretation, in the context of the patient's clinical presentation, is stable disease  MR BRAIN W WO CONTRAST  Result Date: 01/06/2023 CLINICAL DATA:  Brain/CNS neoplasm, assess treatment response. Oligodendroglioma EXAM: MRI HEAD WITHOUT AND WITH CONTRAST TECHNIQUE: Multiplanar, multiecho pulse sequences of the brain and surrounding structures were obtained without and with intravenous contrast. CONTRAST:  5mL GADAVIST GADOBUTROL 1 MMOL/ML IV SOLN COMPARISON:  MRI head with without and with contrast 07/15/2022,/19/23 and 12/13/2019. FINDINGS: Brain: The patient is status post left frontal craniotomy for resection of tumor. Chronic encephalomalacia in the surgical bed is stable. Adjacent T2 and FLAIR signal changes are similar to the most recent exam. T2 and FLAIR signal hyperintensity along the posterior  margin of the surgical cavity and extending to the frontal horn of the left lateral ventricle demonstrates slow interval progression compared with more remote studies. Minimal T2 hyperintensity extended to the ventricle on 01/07/2021. T2 signal change measured in transverse diameter on coronal image 22 is 18 mm. This compares to 11 mm on 01/07/2021. The postcontrast images demonstrate no pathologic enhancement. No distal T2 or FLAIR signal changes are present. White matter is otherwise within  normal limits. The ventricles are of normal size. No significant extraaxial fluid collection is present. The brainstem and cerebellum are within normal limits. The internal auditory canals are within normal limits. Midline structures are within normal limits. Vascular: Flow is present in the major intracranial arteries. Skull and upper cervical spine: The craniocervical junction is normal. Upper cervical spine is within normal limits. Marrow signal is unremarkable. Sinuses/Orbits: Mild mucosal thickening is present in the left sphenoid sinus. The paranasal sinuses and mastoid air cells are otherwise clear. The globes and orbits are within normal limits. IMPRESSION: 1. Slow interval progression of T2 and FLAIR signal hyperintensity along the posterior margin of the surgical cavity and extending to the frontal horn of the left lateral ventricle. Although there is no significant change from the most recent exam, there has been progression over the past 2 years, concerning for recurrent tumor. 2. Otherwise stable appearance of the brain. 3. Mild left sphenoid sinus disease. These results will be called to the ordering clinician or representative by the Radiologist Assistant, and communication documented in the PACS or [employer]. Electronically Signed   By: Marin Roberts M.D.   On: 01/06/2023 13:24     Assessment/Plan Oligodendroglioma, IDH gene mutant and 1p/19q-codeleted (HCC)  Maureen Key is  clinically stable today, with slow progression of impairments in memory and cognition, now interfering with her ability to work full time.  MRI brain demonstrates gradual infiltration of T2/FLAIR signal abnormality in left frontal lobe, c/w progressive neoplasm.    We had an extensive conversation with her regarding available treatment pathways.    We ultimately recommended proceeding with course of intensity modulated radiation therapy and concurrent daily Temozolomide.  Radiation will be administered Mon-Fri over 6 weeks, Temodar will be dosed at 75mg /m2 to be given daily over 42 days.  We reviewed side effects of temodar, including fatigue, nausea/vomiting, constipation, and cytopenias.  Informed consent was verbally obtained at bedside to proceed with oral chemotherapy.  Chemotherapy should be held for the following:  ANC less than 1,000  Platelets less than 100,000  LFT or creatinine greater than 2x ULN  If clinical concerns/contraindications develop  Every 2 weeks during radiation, labs will be checked accompanied by a clinical evaluation in the brain tumor clinic.  For headache prevention, recommended trial of Propanolol 20mg  BID.  For acute migraine, recommended Ibuprofen 800mg  or Naproxen 550mg  PRN with early HA symptoms.   Referral will be placed for radiation oncology, she is agreeable to this.  We ask that Maureen Key return to clinic during week 2 of IMRT and TMZ.  All questions were answered. The patient knows to call the clinic with any problems, questions or concerns. No barriers to learning were detected.  I have spent a total of 40 minutes of face-to-face and non-face-to-face time, excluding clinical staff time, preparing to see patient, ordering tests and/or medications, counseling the patient, and independently interpreting results and communicating results to the patient/family/caregiver    Henreitta Leber, MD Medical Director of Neuro-Oncology [facility] 01/09/23 11:02 AM

## 2023-01-10 ENCOUNTER — Other Ambulatory Visit (HOSPITAL_COMMUNITY)
Admission: RE | Admit: 2023-01-10 | Discharge: 2023-01-10 | Disposition: A | Discharge status: Home / Self Care | Payer: Medicare Other | Source: Ambulatory Visit | Attending: Obstetrics & Gynecology | Admitting: Obstetrics & Gynecology

## 2023-01-10 ENCOUNTER — Ambulatory Visit (INDEPENDENT_AMBULATORY_CARE_PROVIDER_SITE_OTHER): Payer: Medicare Other | Admitting: Obstetrics & Gynecology

## 2023-01-10 ENCOUNTER — Encounter: Payer: Self-pay | Admitting: Obstetrics & Gynecology

## 2023-01-10 VITALS — BP 100/68 | HR 78 | Ht 60.0 in

## 2023-01-10 DIAGNOSIS — Z01419 Encounter for gynecological examination (general) (routine) without abnormal findings: Secondary | ICD-10-CM

## 2023-01-10 DIAGNOSIS — Z1151 Encounter for screening for human papillomavirus (HPV): Secondary | ICD-10-CM | POA: Diagnosis not present

## 2023-01-10 NOTE — Addendum Note (Signed)
Addended by: Annamarie Dawley on: 01/10/2023 04:10 PM   Modules accepted: Orders

## 2023-01-10 NOTE — Progress Notes (Signed)
Subjective:     Maureen Key is a 41 y.o. female here for a routine exam.  Patient's last menstrual period was 08/05/2016. [id number] Birth Control Method:  hysterectomy Menstrual Calendar(currently): amenorrhea  Current complaints: none.   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 Last Pap: 2023,  normal Last mammogram: 11/08/22,  normal  Past Medical History:  Diagnosis Date   Anxiety 2008   Blood transfusion without reported diagnosis    last PPH delivery   History of placenta accreta in prior pregnancy, currently pregnant    History of sepsis    Oligodendroglioma (HCC)    Sickle cell trait (HCC)     Past Surgical History:  Procedure Laterality Date   ABDOMINAL HYSTERECTOMY  05/12/2017   Procedure: HYSTERECTOMY ABDOMINAL;  Surgeon: Tilda Burrow, MD;  Location: [facility] BIRTHING SUIT[facility];  Service: Obstetrics;;   APPLICATION OF CRANIAL NAVIGATION N/A 02/04/2020   Procedure: APPLICATION OF CRANIAL NAVIGATION;  Surgeon: Jadene Pierini, MD;  Location: [facility] OR;  Service: Neurosurgery;  Laterality: N/A;  APPLICATION OF CRANIAL NAVIGATION   BRAIN SURGERY  01/05/2020   CERVICAL CONE BIOPSY     CESAREAN SECTION     CESAREAN SECTION N/A 05/12/2017   Procedure: CESAREAN SECTION WITH Hysterectomy;  Surgeon: Tilda Burrow, MD;  Location: [facility] BIRTHING SUITES;  Service: Obstetrics;  Laterality: N/A;   CRANIOTOMY Left 02/04/2020   Procedure: LEFT CRANIOTOMY FOR TUMOR RESECTION;  Surgeon: Jadene Pierini, MD;  Location: [facility] OR;  Service: Neurosurgery;  Laterality: Left;  LEFT CRANIOTOMY FOR TUMOR RESECTION   TIBIA IM NAIL INSERTION Right 12/13/2019   Procedure: INTRAMEDULLARY (IM) NAIL TIBIAL;  Surgeon: Roby Lofts, MD;  Location: [facility] OR;  Service: Orthopedics;  Laterality: Right;    OB History     Gravida  3   Para  3   Term  2   Preterm  1   AB      Living  3      SAB       IAB      Ectopic      Multiple  1   Live Births  3        Obstetric Comments  Mono/mono twins, scheduled         Social History   Socioeconomic History   Marital status: Single    Spouse name: Not on file   Number of children: 4   Years of education: Not on file   Highest education level: 12th grade  Occupational History   Not on file  Tobacco Use   Smoking status: Former    Packs/day: 0.25    Years: 15.00    Additional pack years: 0.00    Total pack years: 3.75    Types: Cigarettes    Quit date: 03/08/2017    Years since quitting: 5.8   Smokeless tobacco: Never   Tobacco comments:    3 cigarettes a day  Vaping Use   Vaping Use: Never used  Substance and Sexual Activity   Alcohol use: Not Currently    Alcohol/week: 1.0 standard drink of alcohol    Types: 1 Glasses of wine per week    Comment: On occasions   Drug use: Yes    Frequency: 2.0 times per week    Types: Marijuana    Comment: last used 04/09/17   Sexual activity: Yes    Birth control/protection: Condom, None  Other Topics  Concern   Not on file  Social History Narrative   Not on file   Social Determinants of Health   Financial Resource Strain: Low Risk  (01/10/2023)   Overall Financial Resource Strain (CARDIA)    Difficulty of Paying Living Expenses: Not hard at all  Food Insecurity: No Food Insecurity (01/10/2023)   Hunger Vital Sign    Worried About Running Out of Food in the Last Year: Never true    Ran Out of Food in the Last Year: Never true  Recent Concern: Food Insecurity - Food Insecurity Present (11/28/2022)   Hunger Vital Sign    Worried About Running Out of Food in the Last Year: Sometimes true    Ran Out of Food in the Last Year: Sometimes true  Transportation Needs: No Transportation Needs (01/10/2023)   PRAPARE - Administrator, Civil Service (Medical): No    Lack of Transportation (Non-Medical): No  Physical Activity: Insufficiently Active (01/10/2023)   Exercise  Vital Sign    Days of Exercise per Week: 3 days    Minutes of Exercise per Session: 30 min  Stress: No Stress Concern Present (01/10/2023)   [employer] of Occupational Health - Occupational Stress Questionnaire    Feeling of Stress : Not at all  Social Connections: Moderately Isolated (01/10/2023)   Social Connection and Isolation Panel [NHANES]    Frequency of Communication with Friends and Family: More than three times a week    Frequency of Social Gatherings with Friends and Family: Twice a week    Attends Religious Services: 1 to 4 times per year    Active Member of [employer] or Organizations: No    Attends Engineer, structural: Never    Marital Status: Never married    Family History  Problem Relation Age of Onset   Hypertension Mother    HIV/AIDS Father    Cancer Father        lung   Diabetes Sister    Diabetes Maternal Aunt    Diabetes Maternal Uncle    Cancer Paternal Aunt    Cancer Paternal Uncle    Diabetes Maternal Grandmother    Alzheimer's disease Paternal Grandmother      Current Outpatient Medications:    cetirizine (ZYRTEC) 10 MG tablet, Take 1 tablet (10 mg total) by mouth daily., Disp: 10 tablet, Rfl: 0   propranolol (INDERAL) 20 MG tablet, Take 1 tablet (20 mg total) by mouth 3 (three) times daily., Disp: 60 tablet, Rfl: 3   Vitamin D, Ergocalciferol, (DRISDOL) 1.25 MG (50000 UNIT) CAPS capsule, Take 1 capsule (50,000 Units total) by mouth every 7 (seven) days. ONCE WEEKLY, Disp: 4 capsule, Rfl: 2   acetaminophen (TYLENOL) 500 MG tablet, Take 1 tablet (500 mg total) by mouth every 6 (six) hours as needed. (Patient not taking: Reported on 01/10/2023), Disp: 30 tablet, Rfl: 0  Review of Systems  Review of Systems  Constitutional: Negative for fever, chills, weight loss, malaise/fatigue and diaphoresis.  HENT: Negative for hearing loss, ear pain, nosebleeds, congestion, sore throat, neck pain, tinnitus and ear discharge.   Eyes: Negative for blurred  vision, double vision, photophobia, pain, discharge and redness.  Respiratory: Negative for cough, hemoptysis, sputum production, shortness of breath, wheezing and stridor.   Cardiovascular: Negative for chest pain, palpitations, orthopnea, claudication, leg swelling and PND.  Gastrointestinal: negative for abdominal pain. Negative for heartburn, nausea, vomiting, diarrhea, constipation, blood in stool and melena.  Genitourinary: Negative for dysuria, urgency, frequency, hematuria and  flank pain.  Musculoskeletal: Negative for myalgias, back pain, joint pain and falls.  Skin: Negative for itching and rash.  Neurological: Negative for dizziness, tingling, tremors, sensory change, speech change, focal weakness, seizures, loss of consciousness, weakness and headaches.  Endo/Heme/Allergies: Negative for environmental allergies and polydipsia. Does not bruise/bleed easily.  Psychiatric/Behavioral: Negative for depression, suicidal ideas, hallucinations, memory loss and substance abuse. The patient is not nervous/anxious and does not have insomnia.        Objective:  Blood pressure 100/68, pulse 78, height 5' (1.524 m), last menstrual period 08/05/2016.   Physical Exam  Vitals reviewed. Constitutional: She is oriented to person, place, and time. She appears well-developed and well-nourished.  HENT:  Head: Normocephalic and atraumatic.        Right Ear: External ear normal.  Left Ear: External ear normal.  Nose: Nose normal.  Mouth/Throat: Oropharynx is clear and moist.  Eyes: Conjunctivae and EOM are normal. Pupils are equal, round, and reactive to light. Right eye exhibits no discharge. Left eye exhibits no discharge. No scleral icterus.  Neck: Normal range of motion. Neck supple. No tracheal deviation present. No thyromegaly present.  Cardiovascular: Normal rate, regular rhythm, normal heart sounds and intact distal pulses.  Exam reveals no gallop and no friction rub.   No murmur  heard. Respiratory: Effort normal and breath sounds normal. No respiratory distress. She has no wheezes. She has no rales. She exhibits no tenderness.  GI: Soft. Bowel sounds are normal. She exhibits no distension and no mass. There is no tenderness. There is no rebound and no guarding.  Genitourinary:  Breasts no masses skin changes or nipple changes bilaterally      Vulva is normal without lesions Vagina is pink moist without discharge Cervix normal in appearance and pap is done Uterus is absent Adnexa is negative with normal sized ovaries   Musculoskeletal: Normal range of motion. She exhibits no edema and no tenderness.  Neurological: She is alert and oriented to person, place, and time. She has normal reflexes. She displays normal reflexes. No cranial nerve deficit. She exhibits normal muscle tone. Coordination normal.  Skin: Skin is warm and dry. No rash noted. No erythema. No pallor.  Psychiatric: She has a normal mood and affect. Her behavior is normal. Judgment and thought content normal.       Medications Ordered at today's visit: No orders of the defined types were placed in this encounter.   Other orders placed at today's visit: No orders of the defined types were placed in this encounter.     Assessment:    Normal Gyn exam.    Plan:    Contraception: status post hysterectomy. Follow up in: 3 years.     Return in about 3 years (around 01/09/2026).

## 2023-01-11 ENCOUNTER — Other Ambulatory Visit: Payer: Self-pay | Admitting: Radiation Oncology

## 2023-01-11 DIAGNOSIS — C719 Malignant neoplasm of brain, unspecified: Secondary | ICD-10-CM

## 2023-01-11 NOTE — Progress Notes (Signed)
Radiation Oncology         ([phone]) [phone] ________________________________  Name: Maureen Key        MRN: [id number]  Date of Service: 01/17/2023 DOB: Apr 05, 1981  WU:[id number], Zorita Pang, NP-C  Vaslow, Georgeanna Lea, MD     REFERRING PHYSICIAN: Henreitta Leber, MD   DIAGNOSIS: The primary encounter diagnosis was Oligodendroglioma, IDH gene mutant and 1p/19q-codeleted (HCC). A diagnosis of Oligodendroglioma of frontal lobe (HCC) was also pertinent to this visit.   HISTORY OF PRESENT ILLNESS: Maureen Key is a 42 y.o. female seen at the request of Dr. Barbaraann Cao for a diagnosis of a primary brain tumor.  The patient presented after a fall in June 2021.  During her trauma workup, she was found to have a mass in the left frontal lobe of the brain by MRI that was approximately 5 cm in greatest dimension.  She underwent a craniotomy with Dr. Johnsie Cancel in July 2021 which showed a oligodendroglioma grade 3 IDH mutant and 1P/19 Q code deleted.  Her path was reviewed as well at [facility].  She established care postoperatively with Dr. Barbaraann Cao and given favorable tumor genetic profile his note indicates that chemoradiation was offered but the patient decided to forego this and focus on surveillance alone.  She has been followed with imaging since.  An MRI in January 2024 showed no evidence of acute intracranial abnormality and similar appearance of the left frontal lobe hyperintensity.  An MRI on 12/29/2022 however showed slow interval progression of T2 and FLAIR signal hyperintensity along the posterior margin of the surgical cavity extending to the frontal horn of the left lateral ventricle.  While there was not significant change from the most recent exam in January 2024, there was progression over the last 2 years concerning for recurrent disease.  She met with Dr. Barbaraann Cao to discuss this and is seen to discuss chemoradiation at this time.    PREVIOUS RADIATION THERAPY: No   PAST MEDICAL HISTORY:   Past Medical History:  Diagnosis Date   Anxiety 2008   Blood transfusion without reported diagnosis    last PPH delivery   History of placenta accreta in prior pregnancy, currently pregnant    History of sepsis    Oligodendroglioma (HCC)    Sickle cell trait (HCC)        PAST SURGICAL HISTORY: Past Surgical History:  Procedure Laterality Date   ABDOMINAL HYSTERECTOMY  05/12/2017   Procedure: HYSTERECTOMY ABDOMINAL;  Surgeon: Tilda Burrow, MD;  Location: [facility] BIRTHING SUITES;  Service: Obstetrics;;   APPLICATION OF CRANIAL NAVIGATION N/A 02/04/2020   Procedure: APPLICATION OF CRANIAL NAVIGATION;  Surgeon: Jadene Pierini, MD;  Location: [facility] OR;  Service: Neurosurgery;  Laterality: N/A;  APPLICATION OF CRANIAL NAVIGATION   BRAIN SURGERY  01/05/2020   CERVICAL CONE BIOPSY     CESAREAN SECTION     CESAREAN SECTION N/A 05/12/2017   Procedure: CESAREAN SECTION WITH Hysterectomy;  Surgeon: Tilda Burrow, MD;  Location: [facility] BIRTHING SUITES;  Service: Obstetrics;  Laterality: N/A;   CRANIOTOMY Left 02/04/2020   Procedure: LEFT CRANIOTOMY FOR TUMOR RESECTION;  Surgeon: Jadene Pierini, MD;  Location: [facility] OR;  Service: Neurosurgery;  Laterality: Left;  LEFT CRANIOTOMY FOR TUMOR RESECTION   TIBIA IM NAIL INSERTION Right 12/13/2019   Procedure: INTRAMEDULLARY (IM) NAIL TIBIAL;  Surgeon: Roby Lofts, MD;  Location: [facility] OR;  Service: Orthopedics;  Laterality: Right;     FAMILY HISTORY:  Family History  Problem Relation Age of  Onset   Hypertension Mother    HIV/AIDS Father    Cancer Father        lung   Diabetes Sister    Diabetes Maternal Aunt    Diabetes Maternal Uncle    Cancer Paternal Aunt    Cancer Paternal Uncle    Diabetes Maternal Grandmother    Alzheimer's disease Paternal Grandmother      SOCIAL HISTORY:  reports that she quit smoking about 5 years ago. Her smoking use included cigarettes. She has a 3.75 pack-year smoking history. She has never used smokeless  tobacco. She reports that she does not currently use alcohol after a past usage of about 1.0 standard drink of alcohol per week. She reports current drug use. Frequency: 2.00 times per week. Drug: Marijuana.   ALLERGIES: Patient has no known allergies.   MEDICATIONS:  Current Outpatient Medications  Medication Sig Dispense Refill   cetirizine (ZYRTEC) 10 MG tablet Take 1 tablet (10 mg total) by mouth daily. 10 tablet 0   propranolol (INDERAL) 20 MG tablet Take 1 tablet (20 mg total) by mouth 3 (three) times daily. 60 tablet 3   Vitamin D, Ergocalciferol, (DRISDOL) 1.25 MG (50000 UNIT) CAPS capsule Take 1 capsule (50,000 Units total) by mouth every 7 (seven) days. ONCE WEEKLY 4 capsule 2   acetaminophen (TYLENOL) 500 MG tablet Take 1 tablet (500 mg total) by mouth every 6 (six) hours as needed. (Patient not taking: Reported on 01/10/2023) 30 tablet 0   No current facility-administered medications for this encounter.     REVIEW OF SYSTEMS: On review of systems, the patient reports that she has some short term memory loss interfering with her daily activities. She endorses migraines that are controlled with medication. She endorses some numbness and weakness in her left upper extremity. She denies any issues with gait, nausea, visual or auditory changes.   PHYSICAL EXAM:  Wt Readings from Last 3 Encounters:  01/17/23 118 lb 4 oz (53.6 kg)  01/09/23 118 lb 14.4 oz (53.9 kg)  12/02/22 115 lb (52.2 kg)   Temp Readings from Last 3 Encounters:  01/17/23 (!) 97.5 F (36.4 C) (Temporal)  01/09/23 97.7 F (36.5 C) (Temporal)  12/02/22 97.6 F (36.4 C) (Temporal)   BP Readings from Last 3 Encounters:  01/17/23 120/89  01/10/23 100/68  01/09/23 111/81   Pulse Readings from Last 3 Encounters:  01/17/23 87  01/10/23 78  01/09/23 67   Pain Assessment Pain Score: 0-No pain/10  In general this is a well appearing African American female in no acute distress. She's alert and oriented x4  and appropriate throughout the examination. Cardiopulmonary assessment is negative for acute distress and she exhibits normal effort. EOMs intact. Discrimination and coordination intact. Bilateral strength throughout. Decreased sensation on the left upper extremity.    ECOG = 1  0 - Asymptomatic (Fully active, able to carry on all predisease activities without restriction)  1 - Symptomatic but completely ambulatory (Restricted in physically strenuous activity but ambulatory and able to carry out work of a light or sedentary nature. For example, light housework, office work)  2 - Symptomatic, <50% in bed during the day (Ambulatory and capable of all self care but unable to carry out any work activities. Up and about more than 50% of waking hours)  3 - Symptomatic, >50% in bed, but not bedbound (Capable of only limited self-care, confined to bed or chair 50% or more of waking hours)  4 - Bedbound (Completely disabled. Cannot carry  on any self-care. Totally confined to bed or chair)  5 - Death   Santiago Glad MM, Creech RH, Tormey DC, et al. [phone]). "Toxicity and response criteria of the [facility]". Am. Evlyn Clines. Oncol. 5 (6): 649-55    LABORATORY DATA:  Lab Results  Component Value Date   WBC 4.1 11/03/2022   HGB 14.2 11/03/2022   HCT 41.6 11/03/2022   MCV 98.6 11/03/2022   PLT 219.0 11/03/2022   Lab Results  Component Value Date   NA 140 01/17/2023   K 4.0 01/17/2023   CL 108 01/17/2023   CO2 28 01/17/2023   Lab Results  Component Value Date   ALT 15 11/03/2022   AST 17 11/03/2022   ALKPHOS 72 11/03/2022   BILITOT 0.3 11/03/2022      RADIOGRAPHY: MR BRAIN W WO CONTRAST  Result Date: 01/06/2023 CLINICAL DATA:  Brain/CNS neoplasm, assess treatment response. Oligodendroglioma EXAM: MRI HEAD WITHOUT AND WITH CONTRAST TECHNIQUE: Multiplanar, multiecho pulse sequences of the brain and surrounding structures were obtained without and with intravenous contrast.  CONTRAST:  5mL GADAVIST GADOBUTROL 1 MMOL/ML IV SOLN COMPARISON:  MRI head with without and with contrast 07/15/2022,/19/23 and 12/13/2019. FINDINGS: Brain: The patient is status post left frontal craniotomy for resection of tumor. Chronic encephalomalacia in the surgical bed is stable. Adjacent T2 and FLAIR signal changes are similar to the most recent exam. T2 and FLAIR signal hyperintensity along the posterior margin of the surgical cavity and extending to the frontal horn of the left lateral ventricle demonstrates slow interval progression compared with more remote studies. Minimal T2 hyperintensity extended to the ventricle on 01/07/2021. T2 signal change measured in transverse diameter on coronal image 22 is 18 mm. This compares to 11 mm on 01/07/2021. The postcontrast images demonstrate no pathologic enhancement. No distal T2 or FLAIR signal changes are present. White matter is otherwise within normal limits. The ventricles are of normal size. No significant extraaxial fluid collection is present. The brainstem and cerebellum are within normal limits. The internal auditory canals are within normal limits. Midline structures are within normal limits. Vascular: Flow is present in the major intracranial arteries. Skull and upper cervical spine: The craniocervical junction is normal. Upper cervical spine is within normal limits. Marrow signal is unremarkable. Sinuses/Orbits: Mild mucosal thickening is present in the left sphenoid sinus. The paranasal sinuses and mastoid air cells are otherwise clear. The globes and orbits are within normal limits. IMPRESSION: 1. Slow interval progression of T2 and FLAIR signal hyperintensity along the posterior margin of the surgical cavity and extending to the frontal horn of the left lateral ventricle. Although there is no significant change from the most recent exam, there has been progression over the past 2 years, concerning for recurrent tumor. 2. Otherwise stable  appearance of the brain. 3. Mild left sphenoid sinus disease. These results will be called to the ordering clinician or representative by the Radiologist Assistant, and communication documented in the PACS or [employer]. Electronically Signed   By: Marin Roberts M.D.   On: 01/06/2023 13:24       IMPRESSION/PLAN: 1. Locally recurrent oligodendroglioma of the left frontal lobe.  We discussed the patient's case and reviewed her prior pathology and imaging at the time of her diagnosis as well as her most recent scans.  We discussed the rationale for chemoradiation.  We discussed the risks, benefits, short, and long term effects of radiotherapy, as well as the curative intent, and the patient is interested in  proceeding.  Dr. Mitzi Hansen discussed the delivery and logistics of radiotherapy and anticipate a course of 6 weeks of radiotherapy. Written consent is obtained and placed in the chart, a copy was provided to the patient. The patient will be contacted to coordinate treatment planning by our simulation department with IV contrast.   In a visit lasting 60 minutes, greater than 50% of the time was spent face to face discussing the patient's condition, in preparation for the discussion, and coordinating the patient's care.    ________________________________    Joyice Faster, PA-C    **Disclaimer: This note was dictated with voice recognition software. Similar sounding words can inadvertently be transcribed and this note may contain transcription errors which may not have been corrected upon publication of note.**

## 2023-01-13 LAB — CYTOLOGY - PAP
Chlamydia: NEGATIVE
Comment: NEGATIVE
Comment: NEGATIVE
Comment: NORMAL
Diagnosis: NEGATIVE
High risk HPV: NEGATIVE
Neisseria Gonorrhea: NEGATIVE

## 2023-01-14 ENCOUNTER — Encounter: Payer: Self-pay | Admitting: Family Medicine

## 2023-01-16 NOTE — Progress Notes (Signed)
Location/Histology of Brain Tumor: Left Frontal Lobe- Oligodendroglioma  Patient initially presented in June 2021 after a fall.  Imaging noted a mass in the left frontal lobe.  She underwent a craniotomy with Dr. Johnsie Cancel in July 2021 which showed a oligodendroglioma grade 3 IDH mutant and 1P/19 Q code deleted.  She established care postoperatively with Dr. Barbaraann Cao and given favorable tumor genetic profile his note indicates that chemoradiation was offered but the patient decided to forego this and focus on surveillance alone.  Recent imaging noted interval changes.   MRI Brain 12/29/2022: Slow interval progression of T2 and FLAIR signal hyperintensity along the posterior margin of the surgical cavity and extending to the frontal horn of the left lateral ventricle. Although there is no significant change from the most recent exam, there has been progression over the past 2 years, concerning for recurrent tumor.   MRI Brain 07/2022: No evidence of acute intracranial abnormality or residual/recurrent tumor. Similar appearance of left frontal and surrounding T2/FLAIR hyperintensity.    Past or anticipated interventions, if any, per neurosurgery:  Dr. Maurice Small -Left Craniotomy for tumor resection 02/04/2020. Surgical Pathology Report    Past or anticipated interventions, if any, per medical oncology:  Dr. Barbaraann Cao 01/09/2023 -We ultimately recommended proceeding with course of intensity modulated radiation therapy and concurrent daily Temozolomide.    Dose of Decadron, if applicable: none  Recent neurologic symptoms, if any:  Seizures:  Headaches:  Nausea:  Dizziness/ataxia:  Difficulty with hand coordination:  Focal numbness/weakness:  Visual deficits/changes:  Confusion/Memory deficits: She reports some difficulty with short term memory.    SAFETY ISSUES: Prior radiation?  Pacemaker/ICD?  Possible current pregnancy? Hysterectomy Is the patient on methotrexate?   Additional  Complaints / other details:

## 2023-01-16 NOTE — Telephone Encounter (Signed)
fyi

## 2023-01-17 ENCOUNTER — Ambulatory Visit
Admission: RE | Admit: 2023-01-17 | Discharge: 2023-01-17 | Disposition: A | Discharge status: Home / Self Care | Payer: Medicare Other | Source: Ambulatory Visit | Attending: Radiation Oncology | Admitting: Radiation Oncology

## 2023-01-17 ENCOUNTER — Other Ambulatory Visit: Payer: Self-pay

## 2023-01-17 ENCOUNTER — Ambulatory Visit
Admission: RE | Admit: 2023-01-17 | Discharge: 2023-01-17 | Disposition: A | Discharge status: Home / Self Care | Payer: Medicare Other | Source: Ambulatory Visit | Attending: Internal Medicine | Admitting: Internal Medicine

## 2023-01-17 ENCOUNTER — Encounter: Payer: Self-pay | Admitting: Radiation Oncology

## 2023-01-17 VITALS — BP 120/89 | HR 87 | Temp 97.5°F | Resp 18 | Ht 60.0 in | Wt 118.2 lb

## 2023-01-17 DIAGNOSIS — Z87891 Personal history of nicotine dependence: Secondary | ICD-10-CM | POA: Insufficient documentation

## 2023-01-17 DIAGNOSIS — Z801 Family history of malignant neoplasm of trachea, bronchus and lung: Secondary | ICD-10-CM | POA: Insufficient documentation

## 2023-01-17 DIAGNOSIS — C711 Malignant neoplasm of frontal lobe: Secondary | ICD-10-CM

## 2023-01-17 DIAGNOSIS — Z809 Family history of malignant neoplasm, unspecified: Secondary | ICD-10-CM | POA: Insufficient documentation

## 2023-01-17 DIAGNOSIS — D573 Sickle-cell trait: Secondary | ICD-10-CM | POA: Insufficient documentation

## 2023-01-17 DIAGNOSIS — Z510 Encounter for antineoplastic radiation therapy: Secondary | ICD-10-CM | POA: Insufficient documentation

## 2023-01-17 DIAGNOSIS — C719 Malignant neoplasm of brain, unspecified: Secondary | ICD-10-CM

## 2023-01-17 DIAGNOSIS — Z79899 Other long term (current) drug therapy: Secondary | ICD-10-CM | POA: Insufficient documentation

## 2023-01-17 LAB — BASIC METABOLIC PANEL - CANCER CENTER ONLY
Anion gap: 4 — ABNORMAL LOW (ref 5–15)
BUN: 15 mg/dL (ref 6–20)
CO2: 28 mmol/L (ref 22–32)
Calcium: 10 mg/dL (ref 8.9–10.3)
Chloride: 108 mmol/L (ref 98–111)
Creatinine: 0.8 mg/dL (ref 0.44–1.00)
GFR, Estimated: >60 mL/min (ref >60)
Glucose, Bld: 83 mg/dL (ref 70–99)
Potassium: 4 mmol/L (ref 3.5–5.1)
Sodium: 140 mmol/L (ref 135–145)

## 2023-01-19 NOTE — Progress Notes (Signed)
Has armband been applied?  Yes.    Does patient have an allergy to IV contrast dye?: No.   Has patient ever received premedication for IV contrast dye?: No.   Does patient take metformin?: No.  If patient does take metformin when was the last dose: n/a  Date of lab work: 01/17/2023 BUN: 15 CR: 0.80 eGfr: >60  IV site: Right AC  Has IV site been added to flowsheet?  Yes  BP 102/72 (BP Location: Right Arm, Patient Position: Sitting)   Pulse 63   Temp 98.5 F (36.9 C) (Oral)   Resp 18   Wt 119 lb 3.2 oz (54.1 kg)   LMP 08/05/2016   SpO2 100%   BMI 23.28 kg/m

## 2023-01-20 ENCOUNTER — Telehealth: Payer: Self-pay | Admitting: Pharmacist

## 2023-01-20 ENCOUNTER — Other Ambulatory Visit: Payer: Self-pay | Admitting: Internal Medicine

## 2023-01-20 ENCOUNTER — Encounter: Payer: Self-pay | Admitting: Internal Medicine

## 2023-01-20 ENCOUNTER — Other Ambulatory Visit: Payer: Self-pay

## 2023-01-20 ENCOUNTER — Encounter (HOSPITAL_COMMUNITY): Payer: Self-pay

## 2023-01-20 ENCOUNTER — Ambulatory Visit
Admission: RE | Admit: 2023-01-20 | Discharge: 2023-01-20 | Disposition: A | Discharge status: Home / Self Care | Payer: Medicare Other | Source: Ambulatory Visit | Attending: Radiation Oncology | Admitting: Radiation Oncology

## 2023-01-20 ENCOUNTER — Telehealth: Payer: Self-pay | Admitting: Pharmacy Technician

## 2023-01-20 ENCOUNTER — Other Ambulatory Visit (HOSPITAL_COMMUNITY): Payer: Self-pay

## 2023-01-20 VITALS — BP 102/72 | HR 63 | Temp 98.5°F | Resp 18 | Wt 119.2 lb

## 2023-01-20 DIAGNOSIS — C719 Malignant neoplasm of brain, unspecified: Secondary | ICD-10-CM

## 2023-01-20 DIAGNOSIS — Z510 Encounter for antineoplastic radiation therapy: Secondary | ICD-10-CM | POA: Diagnosis not present

## 2023-01-20 MED ORDER — SODIUM CHLORIDE 0.9% FLUSH
10.0000 mL | Freq: Once | INTRAVENOUS | Status: AC
  Administered 2023-01-20: 10 mL via INTRAVENOUS

## 2023-01-20 MED ORDER — TEMOZOLOMIDE 100 MG PO CAPS
100.0000 mg | ORAL_CAPSULE | Freq: Every day | ORAL | 0 refills | Status: DC
  Filled 2023-01-20: qty 42, 42d supply, fill #0
  Filled 2023-01-23 (×2): qty 28, 28d supply, fill #0
  Filled 2023-02-08: qty 14, 14d supply, fill #1

## 2023-01-20 MED ORDER — ONDANSETRON HCL 8 MG PO TABS
8.0000 mg | ORAL_TABLET | Freq: Three times a day (TID) | ORAL | 1 refills | Status: DC | PRN
Start: 2023-01-20 — End: 2023-02-28
  Filled 2023-01-20 – 2023-01-23 (×3): qty 30, 10d supply, fill #0
  Filled 2023-02-27: qty 30, 10d supply, fill #1

## 2023-01-20 NOTE — Telephone Encounter (Signed)
Oral Oncology Pharmacist Encounter  Received new prescription for Temodar (temozolomide) for the treatment of WHO grade III oligodendroglioma in conjunction with radiation, planned duration 42 days.  BMP from 01/17/23 as well as CBC w/ Diff and CMP from 11/03/22 assessed, no relevant lab abnormalities requiring baseline dose adjustment required at this time. Prescription dose and frequency assessed for appropriateness.  Current medication list in Epic reviewed, no relevant/significant DDIs with Temodar identified.  Evaluated chart and no patient barriers to medication adherence noted.   Patient agreement for treatment documented in MD note on 01/09/23.  Prescription has been e-scribed to the [facility] for benefits analysis and approval.  Oral Oncology Clinic will continue to follow for insurance authorization, copayment issues, initial counseling and start date.  Lenord Carbo, PharmD, BCPS, [facility] Hematology/Oncology Clinical Pharmacist [facility] and [facility] Oral Chemotherapy Navigation Clinics [phone] 01/20/2023 11:42 AM

## 2023-01-20 NOTE — Progress Notes (Signed)
START ON PATHWAY REGIMEN - Neuro   Temozolomide 75 mg/m2 PO Daily D1-42 + RT x 1 Cycle:   One cycle, concurrent with RT:     Temozolomide    Temozolomide 150/200 mg/m2 D1-5 q28 Days:   A cycle is every 28 days:     Temozolomide      Temozolomide   **Always confirm dose/schedule in your pharmacy ordering system**  Patient Characteristics: Glioma, Grade 3 or 4 Astrocytoma, IDH-mutant, Newly Diagnosed or Treatment Naive Disease Classification: Glioma Disease Classification: Grade 3 or 4 Astrocytoma, IDH-mutant Disease Status: Newly Diagnosed or Treatment Naive Intent of Therapy: Non-Curative / Palliative Intent, Discussed with Patient 

## 2023-01-20 NOTE — Telephone Encounter (Signed)
Oral Oncology Patient Advocate Encounter  After completing a benefits investigation, prior authorization for temozolomide is not required at this time through Medicare B and [place] Medicaid.  Patient's copay is $0.     Jinger Neighbors, CPhT-Adv Oncology Pharmacy Patient Advocate [facility] Direct Number: [phone]  Fax: [phone]

## 2023-01-21 ENCOUNTER — Other Ambulatory Visit: Payer: Self-pay

## 2023-01-23 ENCOUNTER — Other Ambulatory Visit (HOSPITAL_COMMUNITY): Payer: Self-pay

## 2023-01-23 ENCOUNTER — Telehealth: Payer: Self-pay | Admitting: Internal Medicine

## 2023-01-23 ENCOUNTER — Encounter: Payer: Self-pay | Admitting: Internal Medicine

## 2023-01-23 ENCOUNTER — Other Ambulatory Visit: Payer: Self-pay

## 2023-01-23 NOTE — Telephone Encounter (Signed)
Scheduled per scheduling message, patient has been called and voicemail was left regarding upcoming appointments.

## 2023-01-23 NOTE — Telephone Encounter (Signed)
Oral Chemotherapy Pharmacist Encounter  I spoke with patient for overview of: Temodar for the treatment of WHO grade III oligodendroglioma in conjunction with radiation, planned duration concomitant phase 42 days of therapy.   Counseled patient on administration, dosing, side effects, monitoring, drug-food interactions, safe handling, storage, and disposal.  Patient will take Temodar 100mg  capsule, 1 capsule (100 mg total daily dose), by mouth once daily, may take at bedtime and on an empty stomach to decrease nausea and vomiting.  Patient will take Temodar concurrent with radiation for 42 days straight.  Temodar start date: 01/29/23 PM Radiation start date: 01/30/23  Adverse effects include but are not limited to: nausea, vomiting, GI upset, rash, and fatigue. Nausea/Vomiting: discussed with patient possibility of N/V with TMZ. Patient has Zofran available to use PRN N/V. We discussed that with the specific dose of TMZ used with radiation, she may not experience nausea, however, if she prefers to use prophylactic zofran she may. Patient knows she can take Zofran 8 mg table, 1 tablet by mouth 30-60 minutes prior to TMZ dose each night to decrease nausea/vomiting.   Reviewed with patient importance of keeping a medication schedule and plan for any missed doses. No barriers to medication adherence identified.  Medication reconciliation performed and medication/allergy list updated.  All questions answered.  Ms. Debruler voiced understanding and appreciation.   Medication education handout placed in mail for patient. Patient knows to call the office with questions or concerns. Oral Chemotherapy Clinic phone number provided to patient.   Lenord Carbo, PharmD, BCPS, [facility] Hematology/Oncology Clinical Pharmacist [facility] and [facility] Oral Chemotherapy Navigation Clinics [phone] 01/23/2023 9:56 AM

## 2023-01-24 ENCOUNTER — Other Ambulatory Visit: Payer: Self-pay

## 2023-01-24 ENCOUNTER — Other Ambulatory Visit (HOSPITAL_COMMUNITY): Payer: Self-pay

## 2023-01-26 DIAGNOSIS — Z510 Encounter for antineoplastic radiation therapy: Secondary | ICD-10-CM | POA: Diagnosis not present

## 2023-01-30 ENCOUNTER — Other Ambulatory Visit: Payer: Self-pay

## 2023-01-30 ENCOUNTER — Ambulatory Visit
Admission: RE | Admit: 2023-01-30 | Discharge: 2023-01-30 | Disposition: A | Discharge status: Home / Self Care | Payer: Medicare Other | Source: Ambulatory Visit | Attending: Radiation Oncology | Admitting: Radiation Oncology

## 2023-01-30 ENCOUNTER — Other Ambulatory Visit (HOSPITAL_COMMUNITY): Payer: Self-pay

## 2023-01-30 DIAGNOSIS — Z510 Encounter for antineoplastic radiation therapy: Secondary | ICD-10-CM | POA: Diagnosis not present

## 2023-01-30 LAB — RAD ONC ARIA SESSION SUMMARY
Course Elapsed Days: 0
Plan Fractions Treated to Date: 1
Plan Prescribed Dose Per Fraction: 2 Gy
Plan Total Fractions Prescribed: 23
Plan Total Prescribed Dose: 46 Gy
Reference Point Dosage Given to Date: 2 Gy
Reference Point Session Dosage Given: 2 Gy
Session Number: 1

## 2023-01-31 ENCOUNTER — Ambulatory Visit
Admission: RE | Admit: 2023-01-31 | Discharge: 2023-01-31 | Disposition: A | Discharge status: Home / Self Care | Payer: Medicare Other | Source: Ambulatory Visit | Attending: Radiation Oncology | Admitting: Radiation Oncology

## 2023-01-31 ENCOUNTER — Other Ambulatory Visit: Payer: Self-pay

## 2023-01-31 DIAGNOSIS — Z510 Encounter for antineoplastic radiation therapy: Secondary | ICD-10-CM | POA: Diagnosis not present

## 2023-01-31 LAB — RAD ONC ARIA SESSION SUMMARY
Course Elapsed Days: 1
Plan Fractions Treated to Date: 2
Plan Prescribed Dose Per Fraction: 2 Gy
Plan Total Fractions Prescribed: 23
Plan Total Prescribed Dose: 46 Gy
Reference Point Dosage Given to Date: 4 Gy
Reference Point Session Dosage Given: 2 Gy
Session Number: 2

## 2023-02-01 ENCOUNTER — Ambulatory Visit: Admission: RE | Admit: 2023-02-01 | Payer: Medicare Other | Source: Ambulatory Visit

## 2023-02-01 ENCOUNTER — Other Ambulatory Visit: Payer: Self-pay

## 2023-02-01 DIAGNOSIS — Z510 Encounter for antineoplastic radiation therapy: Secondary | ICD-10-CM | POA: Diagnosis not present

## 2023-02-01 LAB — RAD ONC ARIA SESSION SUMMARY
Course Elapsed Days: 2
Plan Fractions Treated to Date: 3
Plan Prescribed Dose Per Fraction: 2 Gy
Plan Total Fractions Prescribed: 23
Plan Total Prescribed Dose: 46 Gy
Reference Point Dosage Given to Date: 6 Gy
Reference Point Session Dosage Given: 2 Gy
Session Number: 3

## 2023-02-02 ENCOUNTER — Ambulatory Visit
Admission: RE | Admit: 2023-02-02 | Discharge: 2023-02-02 | Disposition: A | Discharge status: Home / Self Care | Payer: Medicare Other | Source: Ambulatory Visit | Attending: Radiation Oncology | Admitting: Radiation Oncology

## 2023-02-02 ENCOUNTER — Other Ambulatory Visit: Payer: Self-pay

## 2023-02-02 DIAGNOSIS — Z510 Encounter for antineoplastic radiation therapy: Secondary | ICD-10-CM | POA: Diagnosis not present

## 2023-02-02 LAB — RAD ONC ARIA SESSION SUMMARY
Course Elapsed Days: 3
Plan Fractions Treated to Date: 4
Plan Prescribed Dose Per Fraction: 2 Gy
Plan Total Fractions Prescribed: 23
Plan Total Prescribed Dose: 46 Gy
Reference Point Dosage Given to Date: 8 Gy
Reference Point Session Dosage Given: 2 Gy
Session Number: 4

## 2023-02-03 ENCOUNTER — Ambulatory Visit
Admission: RE | Admit: 2023-02-03 | Discharge: 2023-02-03 | Disposition: A | Discharge status: Home / Self Care | Payer: Medicare Other | Source: Ambulatory Visit | Attending: Radiation Oncology | Admitting: Radiation Oncology

## 2023-02-03 ENCOUNTER — Other Ambulatory Visit: Payer: Self-pay

## 2023-02-03 ENCOUNTER — Ambulatory Visit: Admission: RE | Admit: 2023-02-03 | Payer: Medicare Other | Source: Ambulatory Visit

## 2023-02-03 DIAGNOSIS — C719 Malignant neoplasm of brain, unspecified: Secondary | ICD-10-CM

## 2023-02-03 DIAGNOSIS — Z510 Encounter for antineoplastic radiation therapy: Secondary | ICD-10-CM | POA: Diagnosis not present

## 2023-02-03 LAB — RAD ONC ARIA SESSION SUMMARY
Course Elapsed Days: 4
Plan Fractions Treated to Date: 5
Plan Prescribed Dose Per Fraction: 2 Gy
Plan Total Fractions Prescribed: 23
Plan Total Prescribed Dose: 46 Gy
Reference Point Dosage Given to Date: 10 Gy
Reference Point Session Dosage Given: 2 Gy
Session Number: 5

## 2023-02-03 MED ORDER — SONAFINE EX EMUL
1.0000 | Freq: Two times a day (BID) | CUTANEOUS | Status: DC
  Administered 2023-02-03: 1 via TOPICAL

## 2023-02-06 ENCOUNTER — Other Ambulatory Visit: Payer: Self-pay

## 2023-02-06 ENCOUNTER — Ambulatory Visit
Admission: RE | Admit: 2023-02-06 | Discharge: 2023-02-06 | Disposition: A | Discharge status: Home / Self Care | Payer: Medicare Other | Source: Ambulatory Visit | Attending: Radiation Oncology | Admitting: Radiation Oncology

## 2023-02-06 DIAGNOSIS — Z510 Encounter for antineoplastic radiation therapy: Secondary | ICD-10-CM | POA: Diagnosis not present

## 2023-02-06 LAB — RAD ONC ARIA SESSION SUMMARY
Course Elapsed Days: 7
Plan Fractions Treated to Date: 6
Plan Prescribed Dose Per Fraction: 2 Gy
Plan Total Fractions Prescribed: 23
Plan Total Prescribed Dose: 46 Gy
Reference Point Dosage Given to Date: 12 Gy
Reference Point Session Dosage Given: 2 Gy
Session Number: 6

## 2023-02-07 ENCOUNTER — Inpatient Hospital Stay: Payer: Medicare Other

## 2023-02-07 ENCOUNTER — Ambulatory Visit
Admission: RE | Admit: 2023-02-07 | Discharge: 2023-02-07 | Disposition: A | Discharge status: Home / Self Care | Payer: Medicare Other | Source: Ambulatory Visit | Attending: Radiation Oncology | Admitting: Radiation Oncology

## 2023-02-07 ENCOUNTER — Other Ambulatory Visit: Payer: Self-pay

## 2023-02-07 DIAGNOSIS — Z510 Encounter for antineoplastic radiation therapy: Secondary | ICD-10-CM | POA: Diagnosis not present

## 2023-02-07 DIAGNOSIS — C719 Malignant neoplasm of brain, unspecified: Secondary | ICD-10-CM

## 2023-02-07 LAB — RAD ONC ARIA SESSION SUMMARY
Course Elapsed Days: 8
Plan Fractions Treated to Date: 7
Plan Prescribed Dose Per Fraction: 2 Gy
Plan Total Fractions Prescribed: 23
Plan Total Prescribed Dose: 46 Gy
Reference Point Dosage Given to Date: 14 Gy
Reference Point Session Dosage Given: 2 Gy
Session Number: 7

## 2023-02-07 LAB — CBC WITH DIFFERENTIAL (CANCER CENTER ONLY)
Abs Immature Granulocytes: 0.01 10*3/uL (ref 0.00–0.07)
Basophils Absolute: 0 10*3/uL (ref 0.0–0.1)
Basophils Relative: 1 %
Eosinophils Absolute: 0.2 10*3/uL (ref 0.0–0.5)
Eosinophils Relative: 5 %
HCT: 36.4 % (ref 36.0–46.0)
Hemoglobin: 12.9 g/dL (ref 12.0–15.0)
Immature Granulocytes: 0 %
Lymphocytes Relative: 28 %
Lymphs Abs: 1.3 10*3/uL (ref 0.7–4.0)
MCH: 32.6 pg (ref 26.0–34.0)
MCHC: 35.4 g/dL (ref 30.0–36.0)
MCV: 91.9 fL (ref 80.0–100.0)
Monocytes Absolute: 0.3 10*3/uL (ref 0.1–1.0)
Monocytes Relative: 7 %
Neutro Abs: 2.6 10*3/uL (ref 1.7–7.7)
Neutrophils Relative %: 59 %
Platelet Count: 212 10*3/uL (ref 150–400)
RBC: 3.96 MIL/uL (ref 3.87–5.11)
RDW: 11.5 % (ref 11.5–15.5)
WBC Count: 4.4 10*3/uL (ref 4.0–10.5)
nRBC: 0 % (ref 0.0–0.2)

## 2023-02-07 LAB — CMP (CANCER CENTER ONLY)
ALT: 18 U/L (ref 0–44)
AST: 18 U/L (ref 15–41)
Albumin: 4.5 g/dL (ref 3.5–5.0)
Alkaline Phosphatase: 57 U/L (ref 38–126)
Anion gap: 5 (ref 5–15)
BUN: 14 mg/dL (ref 6–20)
CO2: 27 mmol/L (ref 22–32)
Calcium: 9.6 mg/dL (ref 8.9–10.3)
Chloride: 107 mmol/L (ref 98–111)
Creatinine: 0.78 mg/dL (ref 0.44–1.00)
GFR, Estimated: >60 mL/min (ref >60)
Glucose, Bld: 93 mg/dL (ref 70–99)
Potassium: 4 mmol/L (ref 3.5–5.1)
Sodium: 139 mmol/L (ref 135–145)
Total Bilirubin: 0.4 mg/dL (ref 0.3–1.2)
Total Protein: 7.2 g/dL (ref 6.5–8.1)

## 2023-02-08 ENCOUNTER — Other Ambulatory Visit: Payer: Self-pay

## 2023-02-08 ENCOUNTER — Ambulatory Visit
Admission: RE | Admit: 2023-02-08 | Discharge: 2023-02-08 | Disposition: A | Discharge status: Home / Self Care | Payer: Medicare Other | Source: Ambulatory Visit | Attending: Radiation Oncology | Admitting: Radiation Oncology

## 2023-02-08 ENCOUNTER — Encounter (INDEPENDENT_AMBULATORY_CARE_PROVIDER_SITE_OTHER): Payer: Self-pay

## 2023-02-08 DIAGNOSIS — Z510 Encounter for antineoplastic radiation therapy: Secondary | ICD-10-CM | POA: Diagnosis not present

## 2023-02-08 LAB — RAD ONC ARIA SESSION SUMMARY
Course Elapsed Days: 9
Plan Fractions Treated to Date: 8
Plan Prescribed Dose Per Fraction: 2 Gy
Plan Total Fractions Prescribed: 23
Plan Total Prescribed Dose: 46 Gy
Reference Point Dosage Given to Date: 16 Gy
Reference Point Session Dosage Given: 2 Gy
Session Number: 8

## 2023-02-09 ENCOUNTER — Other Ambulatory Visit: Payer: Self-pay

## 2023-02-09 ENCOUNTER — Ambulatory Visit
Admission: RE | Admit: 2023-02-09 | Discharge: 2023-02-09 | Disposition: A | Discharge status: Home / Self Care | Payer: Medicare Other | Source: Ambulatory Visit | Attending: Radiation Oncology | Admitting: Radiation Oncology

## 2023-02-09 DIAGNOSIS — Z809 Family history of malignant neoplasm, unspecified: Secondary | ICD-10-CM | POA: Diagnosis not present

## 2023-02-09 DIAGNOSIS — Z801 Family history of malignant neoplasm of trachea, bronchus and lung: Secondary | ICD-10-CM | POA: Diagnosis not present

## 2023-02-09 DIAGNOSIS — C711 Malignant neoplasm of frontal lobe: Secondary | ICD-10-CM | POA: Diagnosis present

## 2023-02-09 DIAGNOSIS — Z87891 Personal history of nicotine dependence: Secondary | ICD-10-CM | POA: Diagnosis not present

## 2023-02-09 DIAGNOSIS — Z79899 Other long term (current) drug therapy: Secondary | ICD-10-CM | POA: Diagnosis not present

## 2023-02-09 DIAGNOSIS — Z510 Encounter for antineoplastic radiation therapy: Secondary | ICD-10-CM | POA: Diagnosis present

## 2023-02-09 DIAGNOSIS — D573 Sickle-cell trait: Secondary | ICD-10-CM | POA: Diagnosis not present

## 2023-02-09 LAB — RAD ONC ARIA SESSION SUMMARY
Course Elapsed Days: 10
Plan Fractions Treated to Date: 9
Plan Prescribed Dose Per Fraction: 2 Gy
Plan Total Fractions Prescribed: 23
Plan Total Prescribed Dose: 46 Gy
Reference Point Dosage Given to Date: 18 Gy
Reference Point Session Dosage Given: 2 Gy
Session Number: 9

## 2023-02-10 ENCOUNTER — Other Ambulatory Visit: Payer: Self-pay

## 2023-02-10 ENCOUNTER — Ambulatory Visit
Admission: RE | Admit: 2023-02-10 | Discharge: 2023-02-10 | Disposition: A | Discharge status: Home / Self Care | Payer: Medicare Other | Source: Ambulatory Visit | Attending: Radiation Oncology | Admitting: Radiation Oncology

## 2023-02-10 DIAGNOSIS — Z510 Encounter for antineoplastic radiation therapy: Secondary | ICD-10-CM | POA: Diagnosis not present

## 2023-02-10 LAB — RAD ONC ARIA SESSION SUMMARY
Course Elapsed Days: 11
Plan Fractions Treated to Date: 10
Plan Prescribed Dose Per Fraction: 2 Gy
Plan Total Fractions Prescribed: 23
Plan Total Prescribed Dose: 46 Gy
Reference Point Dosage Given to Date: 20 Gy
Reference Point Session Dosage Given: 2 Gy
Session Number: 10

## 2023-02-13 ENCOUNTER — Other Ambulatory Visit: Payer: Self-pay

## 2023-02-13 ENCOUNTER — Ambulatory Visit: Admission: RE | Admit: 2023-02-13 | Payer: Medicare Other | Source: Ambulatory Visit

## 2023-02-13 DIAGNOSIS — Z510 Encounter for antineoplastic radiation therapy: Secondary | ICD-10-CM | POA: Diagnosis not present

## 2023-02-13 LAB — RAD ONC ARIA SESSION SUMMARY
Course Elapsed Days: 14
Plan Fractions Treated to Date: 11
Plan Prescribed Dose Per Fraction: 2 Gy
Plan Total Fractions Prescribed: 23
Plan Total Prescribed Dose: 46 Gy
Reference Point Dosage Given to Date: 22 Gy
Reference Point Session Dosage Given: 2 Gy
Session Number: 11

## 2023-02-14 ENCOUNTER — Inpatient Hospital Stay (HOSPITAL_BASED_OUTPATIENT_CLINIC_OR_DEPARTMENT_OTHER): Payer: Medicare Other | Admitting: Internal Medicine

## 2023-02-14 ENCOUNTER — Other Ambulatory Visit: Payer: Self-pay

## 2023-02-14 ENCOUNTER — Telehealth: Payer: Self-pay | Admitting: *Deleted

## 2023-02-14 ENCOUNTER — Other Ambulatory Visit: Payer: Self-pay | Admitting: Internal Medicine

## 2023-02-14 ENCOUNTER — Ambulatory Visit
Admission: RE | Admit: 2023-02-14 | Discharge: 2023-02-14 | Disposition: A | Discharge status: Home / Self Care | Payer: Medicare Other | Source: Ambulatory Visit | Attending: Radiation Oncology | Admitting: Radiation Oncology

## 2023-02-14 ENCOUNTER — Inpatient Hospital Stay: Payer: Medicare Other

## 2023-02-14 VITALS — BP 115/81 | HR 60 | Temp 98.3°F | Resp 17 | Wt 116.1 lb

## 2023-02-14 DIAGNOSIS — C719 Malignant neoplasm of brain, unspecified: Secondary | ICD-10-CM

## 2023-02-14 DIAGNOSIS — Z510 Encounter for antineoplastic radiation therapy: Secondary | ICD-10-CM | POA: Diagnosis not present

## 2023-02-14 DIAGNOSIS — C711 Malignant neoplasm of frontal lobe: Secondary | ICD-10-CM | POA: Insufficient documentation

## 2023-02-14 DIAGNOSIS — Z87891 Personal history of nicotine dependence: Secondary | ICD-10-CM | POA: Insufficient documentation

## 2023-02-14 LAB — CBC WITH DIFFERENTIAL (CANCER CENTER ONLY)
Abs Immature Granulocytes: 0.01 10*3/uL (ref 0.00–0.07)
Basophils Absolute: 0 10*3/uL (ref 0.0–0.1)
Basophils Relative: 0 %
Eosinophils Absolute: 0.2 10*3/uL (ref 0.0–0.5)
Eosinophils Relative: 3 %
HCT: 36.7 % (ref 36.0–46.0)
Hemoglobin: 13.1 g/dL (ref 12.0–15.0)
Immature Granulocytes: 0 %
Lymphocytes Relative: 23 %
Lymphs Abs: 1.3 10*3/uL (ref 0.7–4.0)
MCH: 32.7 pg (ref 26.0–34.0)
MCHC: 35.7 g/dL (ref 30.0–36.0)
MCV: 91.5 fL (ref 80.0–100.0)
Monocytes Absolute: 0.4 10*3/uL (ref 0.1–1.0)
Monocytes Relative: 8 %
Neutro Abs: 3.6 10*3/uL (ref 1.7–7.7)
Neutrophils Relative %: 66 %
Platelet Count: 204 10*3/uL (ref 150–400)
RBC: 4.01 MIL/uL (ref 3.87–5.11)
RDW: 11.7 % (ref 11.5–15.5)
WBC Count: 5.5 10*3/uL (ref 4.0–10.5)
nRBC: 0 % (ref 0.0–0.2)

## 2023-02-14 LAB — RAD ONC ARIA SESSION SUMMARY
Course Elapsed Days: 15
Plan Fractions Treated to Date: 12
Plan Prescribed Dose Per Fraction: 2 Gy
Plan Total Fractions Prescribed: 23
Plan Total Prescribed Dose: 46 Gy
Reference Point Dosage Given to Date: 24 Gy
Reference Point Session Dosage Given: 2 Gy
Session Number: 12

## 2023-02-14 LAB — CMP (CANCER CENTER ONLY)
ALT: 14 U/L (ref 0–44)
AST: 14 U/L — ABNORMAL LOW (ref 15–41)
Albumin: 4.5 g/dL (ref 3.5–5.0)
Alkaline Phosphatase: 54 U/L (ref 38–126)
Anion gap: 4 — ABNORMAL LOW (ref 5–15)
BUN: 9 mg/dL (ref 6–20)
CO2: 27 mmol/L (ref 22–32)
Calcium: 9.5 mg/dL (ref 8.9–10.3)
Chloride: 108 mmol/L (ref 98–111)
Creatinine: 0.88 mg/dL (ref 0.44–1.00)
GFR, Estimated: >60 mL/min (ref >60)
Glucose, Bld: 107 mg/dL — ABNORMAL HIGH (ref 70–99)
Potassium: 4.2 mmol/L (ref 3.5–5.1)
Sodium: 139 mmol/L (ref 135–145)
Total Bilirubin: 0.5 mg/dL (ref 0.3–1.2)
Total Protein: 7.3 g/dL (ref 6.5–8.1)

## 2023-02-14 MED ORDER — PROPRANOLOL HCL 40 MG PO TABS: 40.0000 mg | ORAL_TABLET | Freq: Two times a day (BID) | ORAL | 3 refills | Status: DC

## 2023-02-14 MED ORDER — DEXAMETHASONE 4 MG PO TABS: 4.0000 mg | ORAL_TABLET | Freq: Every day | ORAL | 0 refills | Status: DC

## 2023-02-14 MED ORDER — SUMATRIPTAN SUCCINATE 100 MG PO TABS: 100.0000 mg | ORAL_TABLET | ORAL | 0 refills | Status: DC | PRN

## 2023-02-14 MED ORDER — RIZATRIPTAN BENZOATE 10 MG PO TABS: 10.0000 mg | ORAL_TABLET | ORAL | 0 refills | Status: DC | PRN

## 2023-02-14 NOTE — Telephone Encounter (Signed)
-----   Message from Henreitta Leber sent at 02/14/2023  2:47 PM EDT ----- We can do Imitrex, I'll put it in ----- Message ----- From: Arville Care, RN Sent: 02/14/2023  11:49 AM EDT To: Henreitta Leber, MD  Maureen Key called back & said maxalt costs $359 and she cannot pay this.  She is asking if there is something else she can take.

## 2023-02-14 NOTE — Telephone Encounter (Signed)
PC to patient, informed her Dr Barbaraann Cao has sent a rx for imitrex to her pharmacy.  She verbalizes understanding.

## 2023-02-14 NOTE — Progress Notes (Signed)
[facility] [address]  [place], [place] [address] [phone]   Interval Evaluation  Date of Service: 02/14/23 Patient Name: Maureen Key Patient MRN: [id number] Patient DOB: Oct 15, 1980 Provider: Henreitta Leber, MD  Identifying Statement:  Maureen Key is a 42 y.o. 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 Maurice Small.  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  IDH 1/2 Mutated.  1p/19q co-deleted  TERT Unknown   Interval History:  Maureen Key presents for follow up today, now having completed 2 weeks of IMRT and Temodar.  Migraines are still present, not improved from prior.  She describes a 10/10 headache today.  Ibuprofen has been ineffective, as has the propanolol 20mg  twice per day.  Otherwise doing well with treatment ,maintaining activity level at home and work.    H+P (12/20/19) Patient presented to medical attention after trauma, fall while roller skating and fracturing tibia one week ago.  This led to trauma evaluation and CNS imaging which demonstrated left frontal mass ~5cm.  She denies any neurologic symptoms, no headaches or seizures.  Currently in cast and wheelchair following tibial fixation over the weekend, healing well.   Medications: Current Outpatient Medications on File Prior to Visit  Medication Sig Dispense Refill   acetaminophen (TYLENOL) 500 MG tablet Take 1 tablet (500 mg total) by mouth every 6 (six) hours as needed. (Patient not taking: Reported on  01/10/2023) 30 tablet 0   cetirizine (ZYRTEC) 10 MG tablet Take 1 tablet (10 mg total) by mouth daily. 10 tablet 0   ondansetron (ZOFRAN) 8 MG tablet Take 1 tablet (8 mg total) by mouth every 8 (eight) hours as needed for nausea or vomiting. May take 30-60 minutes prior to Temodar administration if nausea/vomiting occurs as needed. 30 tablet 1   propranolol (INDERAL) 20 MG tablet Take 1 tablet (20 mg total) by mouth 3 (three) times daily. 60 tablet 3   temozolomide (TEMODAR) 100 MG capsule Take 1 capsule (100 mg total) by mouth daily. May take on an empty stomach to decrease nausea & vomiting. 42 capsule 0   Vitamin D, Ergocalciferol, (DRISDOL) 1.25 MG (50000 UNIT) CAPS capsule Take 1 capsule (50,000 Units total) by mouth every 7 (seven) days. ONCE WEEKLY 4 capsule 2   No current facility-administered medications on file prior to visit.    Allergies: No Known Allergies Past Medical History:  Past Medical History:  Diagnosis Date   Anxiety 2008   Blood transfusion without reported diagnosis    last PPH delivery   History of placenta accreta in prior pregnancy, currently pregnant    History of sepsis    Oligodendroglioma (HCC)    Sickle cell trait (HCC)    Past Surgical History:  Past Surgical History:  Procedure Laterality Date   ABDOMINAL HYSTERECTOMY  05/12/2017   Procedure: HYSTERECTOMY ABDOMINAL;  Surgeon: Tilda Burrow, MD;  Location: [facility] BIRTHING SUITES;  Service: Obstetrics;;   APPLICATION OF CRANIAL NAVIGATION N/A 02/04/2020   Procedure: APPLICATION OF CRANIAL NAVIGATION;  Surgeon: Jadene Pierini, MD;  [facility]: [facility] OR;  Service: Neurosurgery;  Laterality: N/A;  APPLICATION OF CRANIAL NAVIGATION   BRAIN SURGERY  01/05/2020   CERVICAL CONE BIOPSY     CESAREAN SECTION     CESAREAN SECTION N/A 05/12/2017   Procedure: CESAREAN SECTION WITH Hysterectomy;  Surgeon: Tilda Burrow, MD;  Location: [facility] BIRTHING SUITES;  Service: Obstetrics;  Laterality: N/A;   CRANIOTOMY Left  02/04/2020   Procedure: LEFT CRANIOTOMY FOR TUMOR RESECTION;  Surgeon: Jadene Pierini, MD;  Location: [facility] OR;  Service: Neurosurgery;  Laterality: Left;  LEFT CRANIOTOMY FOR TUMOR RESECTION   TIBIA IM NAIL INSERTION Right 12/13/2019   Procedure: INTRAMEDULLARY (IM) NAIL TIBIAL;  Surgeon: Roby Lofts, MD;  Location: [facility] OR;  Service: Orthopedics;  Laterality: Right;   Social History:  Social History   Socioeconomic History   Marital status: Single    Spouse name: Not on file   Number of children: 4   Years of education: Not on file   Highest education level: 12th grade  Occupational History   Not on file  Tobacco Use   Smoking status: Former    Current packs/day: 0.00    Average packs/day: 0.3 packs/day for 15.0 years (3.8 ttl pk-yrs)    Types: Cigarettes    Start date: 03/08/2002    Quit date: 03/08/2017    Years since quitting: 5.9   Smokeless tobacco: Never   Tobacco comments:    3 cigarettes a day  Vaping Use   Vaping status: Never Used  Substance and Sexual Activity   Alcohol use: Not Currently    Alcohol/week: 1.0 standard drink of alcohol    Types: 1 Glasses of wine per week    Comment: On occasions   Drug use: Yes    Frequency: 2.0 times per week    Types: Marijuana    Comment: last used 04/09/17   Sexual activity: Yes    Birth control/protection: Condom, None  Other Topics Concern   Not on file  Social History Narrative   Not on file   Social Determinants of Health   Financial Resource Strain: Low Risk  (01/10/2023)   Overall Financial Resource Strain (CARDIA)    Difficulty of Paying Living Expenses: Not hard at all  Food Insecurity: No Food Insecurity (01/17/2023)   Hunger Vital Sign    Worried About Running Out of Food in the Last Year: Never true    Ran Out of Food in the Last Year: Never true  Recent Concern: Food Insecurity - Food Insecurity Present (11/28/2022)   Hunger Vital Sign    Worried About Running Out of Food in the Last Year: Sometimes  true    Ran Out of Food in the Last Year: Sometimes true  Transportation Needs: No Transportation Needs (01/17/2023)   PRAPARE - Administrator, Civil Service (Medical): No    Lack of Transportation (Non-Medical): No  Physical Activity: Insufficiently Active (01/10/2023)   Exercise Vital Sign    Days of Exercise per Week: 3 days    Minutes of Exercise per Session: 30 min  Stress: No Stress Concern Present (01/10/2023)   [employer] of Occupational Health - Occupational Stress Questionnaire    Feeling of Stress : Not at all  Social Connections: Moderately Isolated (01/10/2023)   Social Connection and Isolation Panel [NHANES]    Frequency of Communication with Friends and Family: More than three times a week    Frequency of Social Gatherings with Friends and Family: Twice a week    Attends Religious  Services: 1 to 4 times per year    Active Member of Clubs or Organizations: No    Attends Banker Meetings: Never    Marital Status: Never married  Intimate Partner Violence: Not At Risk (01/17/2023)   Humiliation, Afraid, Rape, and Kick questionnaire    Fear of Current or Ex-Partner: No    Emotionally Abused: No    Physically Abused: No    Sexually Abused: No   Family History:  Family History  Problem Relation Age of Onset   Hypertension Mother    HIV/AIDS Father    Cancer Father        lung   Diabetes Sister    Diabetes Maternal Aunt    Diabetes Maternal Uncle    Cancer Paternal Aunt    Cancer Paternal Uncle    Diabetes Maternal Grandmother    Alzheimer's disease Paternal Grandmother     Review of Systems: Constitutional: Doesn't report fevers, chills or abnormal weight loss Eyes: Doesn't report blurriness of vision Ears, nose, mouth, throat, and face: Doesn't report sore throat Respiratory: Doesn't report cough, dyspnea or wheezes Cardiovascular: Doesn't report palpitation, chest discomfort  Gastrointestinal:  Doesn't report nausea, constipation,  diarrhea GU: Doesn't report incontinence Skin: Doesn't report skin rashes Neurological: Per HPI Musculoskeletal: Doesn't report joint pain Behavioral/Psych: Doesn't report anxiety  Physical Exam: Vitals:   02/14/23 1024  BP: 115/81  Pulse: 60  Resp: 17  Temp: 98.3 F (36.8 C)  SpO2: 100%     KPS: 80. General: Alert, cooperative, pleasant, in no acute distress Head: Normal EENT: No conjunctival injection or scleral icterus.  Lungs: Resp effort normal Cardiac: Regular rate Abdomen: Non-distended abdomen Skin: No rashes cyanosis or petechiae. Extremities: No clubbing or edema  Neurologic Exam: Mental Status: Awake, alert, attentive to examiner. Oriented to self and environment. Language is fluent with intact comprehension.  Impaired recall, age advanced psychomotor slowing Cranial Nerves: Visual acuity is grossly normal. Visual fields are full. Extra-ocular movements intact. No ptosis. Face is symmetric Motor: Tone and bulk are normal. Power is full in both arms and legs. Reflexes are symmetric, no pathologic reflexes present.  Sensory: Intact to light touch Gait: Orthopedic limitation only  Labs: I have reviewed the data as listed    Component Value Date/Time   NA 139 02/14/2023 0906   NA 143 01/03/2018 0949   K 4.2 02/14/2023 0906   CL 108 02/14/2023 0906   CO2 27 02/14/2023 0906   GLUCOSE 107 (H) 02/14/2023 0906   BUN 9 02/14/2023 0906   BUN 14 01/03/2018 0949   CREATININE 0.88 02/14/2023 0906   CALCIUM 9.5 02/14/2023 0906   PROT 7.3 02/14/2023 0906   PROT 7.6 01/03/2018 0949   ALBUMIN 4.5 02/14/2023 0906   ALBUMIN 4.8 01/03/2018 0949   AST 14 (L) 02/14/2023 0906   ALT 14 02/14/2023 0906   ALKPHOS 54 02/14/2023 0906   BILITOT 0.5 02/14/2023 0906   GFRNONAA >60 02/14/2023 0906   GFRAA >60 01/31/2020 1340   Lab Results  Component Value Date   WBC 5.5 02/14/2023   NEUTROABS 3.6 02/14/2023   HGB 13.1 02/14/2023   HCT 36.7 02/14/2023   MCV 91.5 02/14/2023    PLT 204 02/14/2023    Imaging:  [facility] Clinician Interpretation: I have personally reviewed the CNS images as listed.  My interpretation, in the context of the patient's clinical presentation, is stable disease  No results found.   Assessment/Plan Oligodendroglioma, IDH gene mutant and 1p/19q-codeleted (HCC)  Maureen Key is clinically stable today from oncologic standpoint, now having completed 2 weeks of IMRT and Temodar.  Labs are within normal limits.  Recommended the following for the migraines: -Increase propanolol to 40mg  BID -Trial of PRN Maxalt 10mg  for acute HA, may repeat in 2 hours -Decadron 4mg  daily x5 days -Counseled on sleep hygeine  We ultimately recommended continuing with course of intensity modulated radiation therapy and concurrent daily Temozolomide.  Radiation will be administered Mon-Fri over 6 weeks, Temodar will be dosed at 75mg /m2 to be given daily over 42 days.  We reviewed side effects of temodar, including fatigue, nausea/vomiting, constipation, and cytopenias.  Chemotherapy should be held for the following:  ANC less than 1,000  Platelets less than 100,000  LFT or creatinine greater than 2x ULN  If clinical concerns/contraindications develop  Every 2 weeks during radiation, labs will be checked accompanied by a clinical evaluation in the brain tumor clinic.  We ask that Maureen Key return to clinic during week 4 of IMRT and TMZ.  All questions were answered. The patient knows to call the clinic with any problems, questions or concerns. No barriers to learning were detected.  I have spent a total of 40 minutes of face-to-face and non-face-to-face time, excluding clinical staff time, preparing to see patient, ordering tests and/or medications, counseling the patient, and independently interpreting results and communicating results to the patient/family/caregiver    Henreitta Leber, MD Medical Director of  Neuro-Oncology [facility] 02/14/23 10:25 AM

## 2023-02-15 ENCOUNTER — Ambulatory Visit: Admission: RE | Admit: 2023-02-15 | Payer: Medicare Other | Source: Ambulatory Visit

## 2023-02-15 ENCOUNTER — Other Ambulatory Visit: Payer: Self-pay

## 2023-02-15 DIAGNOSIS — Z510 Encounter for antineoplastic radiation therapy: Secondary | ICD-10-CM | POA: Diagnosis not present

## 2023-02-15 LAB — RAD ONC ARIA SESSION SUMMARY
Course Elapsed Days: 16
Plan Fractions Treated to Date: 13
Plan Prescribed Dose Per Fraction: 2 Gy
Plan Total Fractions Prescribed: 23
Plan Total Prescribed Dose: 46 Gy
Reference Point Dosage Given to Date: 26 Gy
Reference Point Session Dosage Given: 2 Gy
Session Number: 13

## 2023-02-16 ENCOUNTER — Ambulatory Visit: Payer: Medicare Other

## 2023-02-17 ENCOUNTER — Ambulatory Visit
Admission: RE | Admit: 2023-02-17 | Discharge: 2023-02-17 | Disposition: A | Discharge status: Home / Self Care | Payer: Medicare Other | Source: Ambulatory Visit | Attending: Radiation Oncology | Admitting: Radiation Oncology

## 2023-02-17 ENCOUNTER — Other Ambulatory Visit: Payer: Self-pay

## 2023-02-17 DIAGNOSIS — Z510 Encounter for antineoplastic radiation therapy: Secondary | ICD-10-CM | POA: Diagnosis not present

## 2023-02-17 LAB — RAD ONC ARIA SESSION SUMMARY
Course Elapsed Days: 18
Plan Fractions Treated to Date: 14
Plan Prescribed Dose Per Fraction: 2 Gy
Plan Total Fractions Prescribed: 23
Plan Total Prescribed Dose: 46 Gy
Reference Point Dosage Given to Date: 28 Gy
Reference Point Session Dosage Given: 2 Gy
Session Number: 14

## 2023-02-18 ENCOUNTER — Other Ambulatory Visit: Payer: Self-pay

## 2023-02-20 ENCOUNTER — Ambulatory Visit: Payer: Medicare Other

## 2023-02-21 ENCOUNTER — Inpatient Hospital Stay: Payer: Medicare Other

## 2023-02-21 ENCOUNTER — Other Ambulatory Visit: Payer: Self-pay

## 2023-02-21 ENCOUNTER — Ambulatory Visit: Admission: RE | Admit: 2023-02-21 | Payer: Medicare Other | Source: Ambulatory Visit

## 2023-02-21 DIAGNOSIS — C719 Malignant neoplasm of brain, unspecified: Secondary | ICD-10-CM

## 2023-02-21 DIAGNOSIS — Z510 Encounter for antineoplastic radiation therapy: Secondary | ICD-10-CM | POA: Diagnosis not present

## 2023-02-21 LAB — CBC WITH DIFFERENTIAL (CANCER CENTER ONLY)
Abs Immature Granulocytes: 0.01 10*3/uL (ref 0.00–0.07)
Basophils Absolute: 0 10*3/uL (ref 0.0–0.1)
Basophils Relative: 0 %
Eosinophils Absolute: 0.1 10*3/uL (ref 0.0–0.5)
Eosinophils Relative: 2 %
HCT: 34.4 % — ABNORMAL LOW (ref 36.0–46.0)
Hemoglobin: 12.3 g/dL (ref 12.0–15.0)
Immature Granulocytes: 0 %
Lymphocytes Relative: 30 %
Lymphs Abs: 1.7 10*3/uL (ref 0.7–4.0)
MCH: 33.1 pg (ref 26.0–34.0)
MCHC: 35.8 g/dL (ref 30.0–36.0)
MCV: 92.5 fL (ref 80.0–100.0)
Monocytes Absolute: 0.5 10*3/uL (ref 0.1–1.0)
Monocytes Relative: 8 %
Neutro Abs: 3.4 10*3/uL (ref 1.7–7.7)
Neutrophils Relative %: 60 %
Platelet Count: 224 10*3/uL (ref 150–400)
RBC: 3.72 MIL/uL — ABNORMAL LOW (ref 3.87–5.11)
RDW: 11.7 % (ref 11.5–15.5)
WBC Count: 5.7 10*3/uL (ref 4.0–10.5)
nRBC: 0 % (ref 0.0–0.2)

## 2023-02-21 LAB — CMP (CANCER CENTER ONLY)
ALT: 12 U/L (ref 0–44)
AST: 13 U/L — ABNORMAL LOW (ref 15–41)
Albumin: 4.1 g/dL (ref 3.5–5.0)
Alkaline Phosphatase: 62 U/L (ref 38–126)
Anion gap: 4 — ABNORMAL LOW (ref 5–15)
BUN: 14 mg/dL (ref 6–20)
CO2: 28 mmol/L (ref 22–32)
Calcium: 9.3 mg/dL (ref 8.9–10.3)
Chloride: 109 mmol/L (ref 98–111)
Creatinine: 0.77 mg/dL (ref 0.44–1.00)
GFR, Estimated: >60 mL/min (ref >60)
Glucose, Bld: 81 mg/dL (ref 70–99)
Potassium: 3.2 mmol/L — ABNORMAL LOW (ref 3.5–5.1)
Sodium: 141 mmol/L (ref 135–145)
Total Bilirubin: 0.2 mg/dL — ABNORMAL LOW (ref 0.3–1.2)
Total Protein: 6.7 g/dL (ref 6.5–8.1)

## 2023-02-21 LAB — RAD ONC ARIA SESSION SUMMARY
Course Elapsed Days: 22
Plan Fractions Treated to Date: 15
Plan Prescribed Dose Per Fraction: 2 Gy
Plan Total Fractions Prescribed: 23
Plan Total Prescribed Dose: 46 Gy
Reference Point Dosage Given to Date: 30 Gy
Reference Point Session Dosage Given: 2 Gy
Session Number: 15

## 2023-02-22 ENCOUNTER — Other Ambulatory Visit: Payer: Self-pay

## 2023-02-22 ENCOUNTER — Ambulatory Visit
Admission: RE | Admit: 2023-02-22 | Discharge: 2023-02-22 | Disposition: A | Discharge status: Home / Self Care | Payer: Medicare Other | Source: Ambulatory Visit | Attending: Radiation Oncology | Admitting: Radiation Oncology

## 2023-02-22 DIAGNOSIS — Z510 Encounter for antineoplastic radiation therapy: Secondary | ICD-10-CM | POA: Diagnosis not present

## 2023-02-22 LAB — RAD ONC ARIA SESSION SUMMARY
Course Elapsed Days: 23
Plan Fractions Treated to Date: 16
Plan Prescribed Dose Per Fraction: 2 Gy
Plan Total Fractions Prescribed: 23
Plan Total Prescribed Dose: 46 Gy
Reference Point Dosage Given to Date: 32 Gy
Reference Point Session Dosage Given: 2 Gy
Session Number: 16

## 2023-02-23 ENCOUNTER — Ambulatory Visit: Payer: Medicare Other

## 2023-02-23 DIAGNOSIS — Z510 Encounter for antineoplastic radiation therapy: Secondary | ICD-10-CM | POA: Diagnosis not present

## 2023-02-24 ENCOUNTER — Ambulatory Visit
Admission: RE | Admit: 2023-02-24 | Discharge: 2023-02-24 | Disposition: A | Discharge status: Home / Self Care | Payer: Medicare Other | Source: Ambulatory Visit | Attending: Radiation Oncology | Admitting: Radiation Oncology

## 2023-02-24 ENCOUNTER — Other Ambulatory Visit: Payer: Self-pay

## 2023-02-24 DIAGNOSIS — Z510 Encounter for antineoplastic radiation therapy: Secondary | ICD-10-CM | POA: Diagnosis not present

## 2023-02-24 LAB — RAD ONC ARIA SESSION SUMMARY
Course Elapsed Days: 25
Plan Fractions Treated to Date: 17
Plan Prescribed Dose Per Fraction: 2 Gy
Plan Total Fractions Prescribed: 23
Plan Total Prescribed Dose: 46 Gy
Reference Point Dosage Given to Date: 34 Gy
Reference Point Session Dosage Given: 2 Gy
Session Number: 17

## 2023-02-25 ENCOUNTER — Other Ambulatory Visit: Payer: Self-pay

## 2023-02-27 ENCOUNTER — Other Ambulatory Visit: Payer: Self-pay | Admitting: Internal Medicine

## 2023-02-27 ENCOUNTER — Other Ambulatory Visit: Payer: Self-pay

## 2023-02-27 ENCOUNTER — Ambulatory Visit: Admission: RE | Admit: 2023-02-27 | Payer: Medicare Other | Source: Ambulatory Visit

## 2023-02-27 ENCOUNTER — Other Ambulatory Visit (HOSPITAL_COMMUNITY): Payer: Self-pay

## 2023-02-27 DIAGNOSIS — Z510 Encounter for antineoplastic radiation therapy: Secondary | ICD-10-CM | POA: Diagnosis not present

## 2023-02-27 LAB — RAD ONC ARIA SESSION SUMMARY
Course Elapsed Days: 28
Plan Fractions Treated to Date: 18
Plan Prescribed Dose Per Fraction: 2 Gy
Plan Total Fractions Prescribed: 23
Plan Total Prescribed Dose: 46 Gy
Reference Point Dosage Given to Date: 36 Gy
Reference Point Session Dosage Given: 2 Gy
Session Number: 18

## 2023-02-28 ENCOUNTER — Other Ambulatory Visit (HOSPITAL_COMMUNITY): Payer: Self-pay

## 2023-02-28 ENCOUNTER — Encounter: Payer: Self-pay | Admitting: Internal Medicine

## 2023-02-28 ENCOUNTER — Inpatient Hospital Stay (HOSPITAL_BASED_OUTPATIENT_CLINIC_OR_DEPARTMENT_OTHER): Payer: Medicare Other | Admitting: Internal Medicine

## 2023-02-28 ENCOUNTER — Other Ambulatory Visit: Payer: Self-pay

## 2023-02-28 ENCOUNTER — Inpatient Hospital Stay: Payer: Medicare Other

## 2023-02-28 ENCOUNTER — Ambulatory Visit: Admission: RE | Admit: 2023-02-28 | Payer: Medicare Other | Source: Ambulatory Visit

## 2023-02-28 ENCOUNTER — Other Ambulatory Visit: Payer: Self-pay | Admitting: *Deleted

## 2023-02-28 VITALS — BP 112/70 | HR 61 | Temp 98.4°F | Resp 14 | Wt 117.2 lb

## 2023-02-28 DIAGNOSIS — Z510 Encounter for antineoplastic radiation therapy: Secondary | ICD-10-CM | POA: Diagnosis not present

## 2023-02-28 DIAGNOSIS — C719 Malignant neoplasm of brain, unspecified: Secondary | ICD-10-CM

## 2023-02-28 LAB — CMP (CANCER CENTER ONLY)
ALT: 12 U/L (ref 0–44)
AST: 12 U/L — ABNORMAL LOW (ref 15–41)
Albumin: 4.1 g/dL (ref 3.5–5.0)
Alkaline Phosphatase: 49 U/L (ref 38–126)
Anion gap: 4 — ABNORMAL LOW (ref 5–15)
BUN: 13 mg/dL (ref 6–20)
CO2: 29 mmol/L (ref 22–32)
Calcium: 9.2 mg/dL (ref 8.9–10.3)
Chloride: 109 mmol/L (ref 98–111)
Creatinine: 0.78 mg/dL (ref 0.44–1.00)
GFR, Estimated: >60 mL/min (ref >60)
Glucose, Bld: 92 mg/dL (ref 70–99)
Potassium: 3.7 mmol/L (ref 3.5–5.1)
Sodium: 142 mmol/L (ref 135–145)
Total Bilirubin: 0.3 mg/dL (ref 0.3–1.2)
Total Protein: 6.7 g/dL (ref 6.5–8.1)

## 2023-02-28 LAB — CBC WITH DIFFERENTIAL (CANCER CENTER ONLY)
Abs Immature Granulocytes: 0.01 10*3/uL (ref 0.00–0.07)
Basophils Absolute: 0 10*3/uL (ref 0.0–0.1)
Basophils Relative: 0 %
Eosinophils Absolute: 0.1 10*3/uL (ref 0.0–0.5)
Eosinophils Relative: 3 %
HCT: 34.8 % — ABNORMAL LOW (ref 36.0–46.0)
Hemoglobin: 12.1 g/dL (ref 12.0–15.0)
Immature Granulocytes: 0 %
Lymphocytes Relative: 22 %
Lymphs Abs: 0.9 10*3/uL (ref 0.7–4.0)
MCH: 32.5 pg (ref 26.0–34.0)
MCHC: 34.8 g/dL (ref 30.0–36.0)
MCV: 93.5 fL (ref 80.0–100.0)
Monocytes Absolute: 0.5 10*3/uL (ref 0.1–1.0)
Monocytes Relative: 11 %
Neutro Abs: 2.6 10*3/uL (ref 1.7–7.7)
Neutrophils Relative %: 64 %
Platelet Count: 229 10*3/uL (ref 150–400)
RBC: 3.72 MIL/uL — ABNORMAL LOW (ref 3.87–5.11)
RDW: 12.1 % (ref 11.5–15.5)
WBC Count: 4 10*3/uL (ref 4.0–10.5)
nRBC: 0 % (ref 0.0–0.2)

## 2023-02-28 LAB — RAD ONC ARIA SESSION SUMMARY
Course Elapsed Days: 29
Plan Fractions Treated to Date: 19
Plan Prescribed Dose Per Fraction: 2 Gy
Plan Total Fractions Prescribed: 23
Plan Total Prescribed Dose: 46 Gy
Reference Point Dosage Given to Date: 38 Gy
Reference Point Session Dosage Given: 2 Gy
Session Number: 19

## 2023-02-28 MED ORDER — ONDANSETRON HCL 8 MG PO TABS
8.0000 mg | ORAL_TABLET | Freq: Three times a day (TID) | ORAL | 1 refills | Status: DC | PRN
Start: 2023-02-28 — End: 2023-06-27
  Filled 2023-02-28: qty 30, 10d supply, fill #0

## 2023-02-28 MED ORDER — METOCLOPRAMIDE HCL 10 MG PO TABS: 10.0000 mg | ORAL_TABLET | Freq: Three times a day (TID) | ORAL | 0 refills | Status: DC | PRN

## 2023-02-28 NOTE — Progress Notes (Signed)
[facility] [address]  [place], [place] [address] [phone]   Interval Evaluation  Date of Service: 02/28/23 Patient Name: Maureen Key Patient MRN: [id number] Patient DOB: [birth year] Provider: Henreitta Leber, MD  Identifying Statement:  Maureen Key is a 42 y.o. 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 Maurice Small.  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  IDH 1/2 Mutated.  1p/19q co-deleted  TERT Unknown   Interval History:  Maureen Key presents for follow up today, now having completed 4 weeks of IMRT and Temodar.  Migraines are somewhat improved on the increased dose of propanolol.  She was unable to get the imitrex due to cost.  Otherwise doing well with treatment ,maintaining activity level at home and work.    H+P (12/20/19) Patient presented to medical attention after trauma, fall while roller skating and fracturing tibia one week ago.  This led to trauma evaluation and CNS imaging which demonstrated left frontal mass ~5cm.  She denies any neurologic symptoms, no headaches or seizures.  Currently in cast and wheelchair following tibial fixation over the weekend, healing well.   Medications: Current Outpatient Medications on File Prior to Visit  Medication Sig Dispense Refill   acetaminophen (TYLENOL) 500 MG tablet Take 1 tablet (500 mg total) by mouth every 6 (six) hours as needed. (Patient not taking: Reported on 01/10/2023) 30 tablet 0   cetirizine (ZYRTEC) 10 MG tablet  Take 1 tablet (10 mg total) by mouth daily. 10 tablet 0   dexamethasone (DECADRON) 4 MG tablet Take 1 tablet (4 mg total) by mouth daily. 5 tablet 0   propranolol (INDERAL) 40 MG tablet Take 1 tablet (40 mg total) by mouth 2 (two) times daily. 60 tablet 3   SUMAtriptan (IMITREX) 100 MG tablet Take 1 tablet (100 mg total) by mouth every 2 (two) hours as needed for migraine. May repeat in 2 hours if headache persists or recurs. 10 tablet 0   temozolomide (TEMODAR) 100 MG capsule Take 1 capsule (100 mg total) by mouth daily. May take on an empty stomach to decrease nausea & vomiting. 42 capsule 0   Vitamin D, Ergocalciferol, (DRISDOL) 1.25 MG (50000 UNIT) CAPS capsule Take 1 capsule (50,000 Units total) by mouth every 7 (seven) days. ONCE WEEKLY 4 capsule 2   No current facility-administered medications on file prior to visit.    Allergies: No Known Allergies Past Medical History:  Past Medical History:  Diagnosis Date   Anxiety 2008   Blood transfusion without reported diagnosis    last PPH delivery   History of placenta accreta in prior pregnancy, currently pregnant    History of sepsis    Oligodendroglioma (HCC)    Sickle cell trait (HCC)    Past Surgical History:  Past Surgical History:  Procedure Laterality Date   ABDOMINAL HYSTERECTOMY  05/12/2017   Procedure: HYSTERECTOMY ABDOMINAL;  Surgeon: Tilda Burrow, MD;  Location: [facility] BIRTHING SUITES;  Service: Obstetrics;;   APPLICATION OF CRANIAL NAVIGATION N/A 02/04/2020   Procedure: APPLICATION OF CRANIAL NAVIGATION;  Surgeon: Maurice Small,  Clovis Pu, MD;  Location: [facility] OR;  Service: Neurosurgery;  Laterality: N/A;  APPLICATION OF CRANIAL NAVIGATION   BRAIN SURGERY  01/05/2020   CERVICAL CONE BIOPSY     CESAREAN SECTION     CESAREAN SECTION N/A 05/12/2017   Procedure: CESAREAN SECTION WITH Hysterectomy;  Surgeon: Tilda Burrow, MD;  Location: [facility] BIRTHING SUITES;  Service: Obstetrics;  Laterality: N/A;   CRANIOTOMY Left 02/04/2020    Procedure: LEFT CRANIOTOMY FOR TUMOR RESECTION;  Surgeon: Jadene Pierini, MD;  Location: [facility] OR;  Service: Neurosurgery;  Laterality: Left;  LEFT CRANIOTOMY FOR TUMOR RESECTION   TIBIA IM NAIL INSERTION Right 12/13/2019   Procedure: INTRAMEDULLARY (IM) NAIL TIBIAL;  Surgeon: Roby Lofts, MD;  Location: [facility] OR;  Service: Orthopedics;  Laterality: Right;   Social History:  Social History   Socioeconomic History   Marital status: Single    Spouse name: Not on file   Number of children: 4   Years of education: Not on file   Highest education level: 12th grade  Occupational History   Not on file  Tobacco Use   Smoking status: Former    Current packs/day: 0.00    Average packs/day: 0.3 packs/day for 15.0 years (3.8 ttl pk-yrs)    Types: Cigarettes    Start date: 03/08/2002    Quit date: 03/08/2017    Years since quitting: 5.9   Smokeless tobacco: Never   Tobacco comments:    3 cigarettes a day  Vaping Use   Vaping status: Never Used  Substance and Sexual Activity   Alcohol use: Not Currently    Alcohol/week: 1.0 standard drink of alcohol    Types: 1 Glasses of wine per week    Comment: On occasions   Drug use: Yes    Frequency: 2.0 times per week    Types: Marijuana    Comment: last used 04/09/17   Sexual activity: Yes    Birth control/protection: Condom, None  Other Topics Concern   Not on file  Social History Narrative   Not on file   Social Determinants of Health   Financial Resource Strain: Low Risk  (01/10/2023)   Overall Financial Resource Strain (CARDIA)    Difficulty of Paying Living Expenses: Not hard at all  Food Insecurity: No Food Insecurity (01/17/2023)   Hunger Vital Sign    Worried About Running Out of Food in the Last Year: Never true    Ran Out of Food in the Last Year: Never true  Recent Concern: Food Insecurity - Food Insecurity Present (11/28/2022)   Hunger Vital Sign    Worried About Running Out of Food in the Last Year: Sometimes true    Ran Out  of Food in the Last Year: Sometimes true  Transportation Needs: No Transportation Needs (01/17/2023)   PRAPARE - Administrator, Civil Service (Medical): No    Lack of Transportation (Non-Medical): No  Physical Activity: Insufficiently Active (01/10/2023)   Exercise Vital Sign    Days of Exercise per Week: 3 days    Minutes of Exercise per Session: 30 min  Stress: No Stress Concern Present (01/10/2023)   [employer] of Occupational Health - Occupational Stress Questionnaire    Feeling of Stress : Not at all  Social Connections: Moderately Isolated (01/10/2023)   Social Connection and Isolation Panel [NHANES]    Frequency of Communication with Friends and Family: More than three times a week    Frequency of Social Gatherings with Friends and Family:  Twice a week    Attends Religious Services: 1 to 4 times per year    Active Member of Clubs or Organizations: No    Attends Banker Meetings: Never    Marital Status: Never married  Intimate Partner Violence: Not At Risk (01/17/2023)   Humiliation, Afraid, Rape, and Kick questionnaire    Fear of Current or Ex-Partner: No    Emotionally Abused: No    Physically Abused: No    Sexually Abused: No   Family History:  Family History  Problem Relation Age of Onset   Hypertension Mother    HIV/AIDS Father    Cancer Father        lung   Diabetes Sister    Diabetes Maternal Aunt    Diabetes Maternal Uncle    Cancer Paternal Aunt    Cancer Paternal Uncle    Diabetes Maternal Grandmother    Alzheimer's disease Paternal Grandmother     Review of Systems: Constitutional: Doesn't report fevers, chills or abnormal weight loss Eyes: Doesn't report blurriness of vision Ears, nose, mouth, throat, and face: Doesn't report sore throat Respiratory: Doesn't report cough, dyspnea or wheezes Cardiovascular: Doesn't report palpitation, chest discomfort  Gastrointestinal:  Doesn't report nausea, constipation, diarrhea GU: Doesn't  report incontinence Skin: Doesn't report skin rashes Neurological: Per HPI Musculoskeletal: Doesn't report joint pain Behavioral/Psych: Doesn't report anxiety  Physical Exam: Vitals:   02/28/23 0905  BP: 112/70  Pulse: 61  Resp: 14  Temp: 98.4 F (36.9 C)  SpO2: 100%     KPS: 80. General: Alert, cooperative, pleasant, in no acute distress Head: Normal EENT: No conjunctival injection or scleral icterus.  Lungs: Resp effort normal Cardiac: Regular rate Abdomen: Non-distended abdomen Skin: No rashes cyanosis or petechiae. Extremities: No clubbing or edema  Neurologic Exam: Mental Status: Awake, alert, attentive to examiner. Oriented to self and environment. Language is fluent with intact comprehension.  Impaired recall, age advanced psychomotor slowing Cranial Nerves: Visual acuity is grossly normal. Visual fields are full. Extra-ocular movements intact. No ptosis. Face is symmetric Motor: Tone and bulk are normal. Power is full in both arms and legs. Reflexes are symmetric, no pathologic reflexes present.  Sensory: Intact to light touch Gait: Orthopedic limitation only  Labs: I have reviewed the data as listed    Component Value Date/Time   NA 142 02/28/2023 0839   NA 143 01/03/2018 0949   K 3.7 02/28/2023 0839   CL 109 02/28/2023 0839   CO2 29 02/28/2023 0839   GLUCOSE 92 02/28/2023 0839   BUN 13 02/28/2023 0839   BUN 14 01/03/2018 0949   CREATININE 0.78 02/28/2023 0839   CALCIUM 9.2 02/28/2023 0839   PROT 6.7 02/28/2023 0839   PROT 7.6 01/03/2018 0949   ALBUMIN 4.1 02/28/2023 0839   ALBUMIN 4.8 01/03/2018 0949   AST 12 (L) 02/28/2023 0839   ALT 12 02/28/2023 0839   ALKPHOS 49 02/28/2023 0839   BILITOT 0.3 02/28/2023 0839   GFRNONAA >60 02/28/2023 0839   GFRAA >60 01/31/2020 1340   Lab Results  Component Value Date   WBC 4.0 02/28/2023   NEUTROABS 2.6 02/28/2023   HGB 12.1 02/28/2023   HCT 34.8 (L) 02/28/2023   MCV 93.5 02/28/2023   PLT 229 02/28/2023      Assessment/Plan Oligodendroglioma, IDH gene mutant and 1p/19q-codeleted (HCC)  Gabreille A Forner is clinically stable today from oncologic standpoint, now having completed 4 weeks of IMRT and Temodar.  Labs are within normal limits.  Recommended  continuing propanolol 40mg  BID for headache prevention.  Will also add 10mg  reglan PRN for acute headaches, can be used in addition to an NSAID.    Also counseled on sleep hygiene and CBT sleep techniques today.  We ultimately recommended continuing with course of intensity modulated radiation therapy and concurrent daily Temozolomide.  Radiation will be administered Mon-Fri over 6 weeks, Temodar will be dosed at 75mg /m2 to be given daily over 42 days.  We reviewed side effects of temodar, including fatigue, nausea/vomiting, constipation, and cytopenias.  Chemotherapy should be held for the following:  ANC less than 1,000  Platelets less than 100,000  LFT or creatinine greater than 2x ULN  If clinical concerns/contraindications develop  Every 2 weeks during radiation, labs will be checked accompanied by a clinical evaluation in the brain tumor clinic.  We ask that Amellia A Rosencrantz return to clinic during week 6 of IMRT and TMZ.  All questions were answered. The patient knows to call the clinic with any problems, questions or concerns. No barriers to learning were detected.  I have spent a total of 30 minutes of face-to-face and non-face-to-face time, excluding clinical staff time, preparing to see patient, ordering tests and/or medications, counseling the patient, and independently interpreting results and communicating results to the patient/family/caregiver    Henreitta Leber, MD Medical Director of Neuro-Oncology [facility] [place] 02/28/23 9:19 AM

## 2023-03-01 ENCOUNTER — Ambulatory Visit
Admission: RE | Admit: 2023-03-01 | Discharge: 2023-03-01 | Disposition: A | Discharge status: Home / Self Care | Payer: Medicare Other | Source: Ambulatory Visit | Attending: Radiation Oncology | Admitting: Radiation Oncology

## 2023-03-01 ENCOUNTER — Other Ambulatory Visit: Payer: Self-pay

## 2023-03-01 DIAGNOSIS — Z510 Encounter for antineoplastic radiation therapy: Secondary | ICD-10-CM | POA: Diagnosis not present

## 2023-03-01 LAB — RAD ONC ARIA SESSION SUMMARY
Course Elapsed Days: 30
Plan Fractions Treated to Date: 20
Plan Prescribed Dose Per Fraction: 2 Gy
Plan Total Fractions Prescribed: 23
Plan Total Prescribed Dose: 46 Gy
Reference Point Dosage Given to Date: 40 Gy
Reference Point Session Dosage Given: 2 Gy
Session Number: 20

## 2023-03-02 ENCOUNTER — Other Ambulatory Visit: Payer: Self-pay

## 2023-03-02 ENCOUNTER — Ambulatory Visit
Admission: RE | Admit: 2023-03-02 | Discharge: 2023-03-02 | Disposition: A | Discharge status: Home / Self Care | Payer: Medicare Other | Source: Ambulatory Visit | Attending: Radiation Oncology | Admitting: Radiation Oncology

## 2023-03-02 DIAGNOSIS — Z510 Encounter for antineoplastic radiation therapy: Secondary | ICD-10-CM | POA: Diagnosis not present

## 2023-03-02 LAB — RAD ONC ARIA SESSION SUMMARY
Course Elapsed Days: 31
Plan Fractions Treated to Date: 21
Plan Prescribed Dose Per Fraction: 2 Gy
Plan Total Fractions Prescribed: 23
Plan Total Prescribed Dose: 46 Gy
Reference Point Dosage Given to Date: 42 Gy
Reference Point Session Dosage Given: 2 Gy
Session Number: 21

## 2023-03-03 ENCOUNTER — Other Ambulatory Visit: Payer: Self-pay

## 2023-03-03 ENCOUNTER — Ambulatory Visit
Admission: RE | Admit: 2023-03-03 | Discharge: 2023-03-03 | Disposition: A | Discharge status: Home / Self Care | Payer: Medicare Other | Source: Ambulatory Visit | Attending: Radiation Oncology | Admitting: Radiation Oncology

## 2023-03-03 ENCOUNTER — Ambulatory Visit: Admission: RE | Admit: 2023-03-03 | Payer: Medicare Other | Source: Ambulatory Visit

## 2023-03-03 DIAGNOSIS — Z510 Encounter for antineoplastic radiation therapy: Secondary | ICD-10-CM | POA: Diagnosis not present

## 2023-03-03 LAB — RAD ONC ARIA SESSION SUMMARY
Course Elapsed Days: 32
Plan Fractions Treated to Date: 22
Plan Prescribed Dose Per Fraction: 2 Gy
Plan Total Fractions Prescribed: 23
Plan Total Prescribed Dose: 46 Gy
Reference Point Dosage Given to Date: 44 Gy
Reference Point Session Dosage Given: 2 Gy
Session Number: 22

## 2023-03-06 ENCOUNTER — Other Ambulatory Visit: Payer: Self-pay

## 2023-03-06 ENCOUNTER — Ambulatory Visit
Admission: RE | Admit: 2023-03-06 | Discharge: 2023-03-06 | Disposition: A | Discharge status: Home / Self Care | Payer: Medicare Other | Source: Ambulatory Visit | Attending: Radiation Oncology | Admitting: Radiation Oncology

## 2023-03-06 DIAGNOSIS — Z510 Encounter for antineoplastic radiation therapy: Secondary | ICD-10-CM | POA: Diagnosis not present

## 2023-03-06 LAB — RAD ONC ARIA SESSION SUMMARY
Course Elapsed Days: 35
Plan Fractions Treated to Date: 23
Plan Prescribed Dose Per Fraction: 2 Gy
Plan Total Fractions Prescribed: 23
Plan Total Prescribed Dose: 46 Gy
Reference Point Dosage Given to Date: 46 Gy
Reference Point Session Dosage Given: 2 Gy
Session Number: 23

## 2023-03-07 ENCOUNTER — Inpatient Hospital Stay: Payer: Medicare Other

## 2023-03-07 ENCOUNTER — Other Ambulatory Visit: Payer: Self-pay

## 2023-03-07 ENCOUNTER — Ambulatory Visit: Payer: Medicare Other | Admitting: Family Medicine

## 2023-03-07 ENCOUNTER — Ambulatory Visit
Admission: RE | Admit: 2023-03-07 | Discharge: 2023-03-07 | Disposition: A | Discharge status: Home / Self Care | Payer: Medicare Other | Source: Ambulatory Visit | Attending: Radiation Oncology | Admitting: Radiation Oncology

## 2023-03-07 DIAGNOSIS — C719 Malignant neoplasm of brain, unspecified: Secondary | ICD-10-CM

## 2023-03-07 DIAGNOSIS — Z510 Encounter for antineoplastic radiation therapy: Secondary | ICD-10-CM | POA: Diagnosis not present

## 2023-03-07 LAB — CBC WITH DIFFERENTIAL (CANCER CENTER ONLY)
Abs Immature Granulocytes: 0.01 10*3/uL (ref 0.00–0.07)
Basophils Absolute: 0 10*3/uL (ref 0.0–0.1)
Basophils Relative: 1 %
Eosinophils Absolute: 0.1 10*3/uL (ref 0.0–0.5)
Eosinophils Relative: 3 %
HCT: 35.4 % — ABNORMAL LOW (ref 36.0–46.0)
Hemoglobin: 12.6 g/dL (ref 12.0–15.0)
Immature Granulocytes: 0 %
Lymphocytes Relative: 17 %
Lymphs Abs: 0.7 10*3/uL (ref 0.7–4.0)
MCH: 33.2 pg (ref 26.0–34.0)
MCHC: 35.6 g/dL (ref 30.0–36.0)
MCV: 93.2 fL (ref 80.0–100.0)
Monocytes Absolute: 0.5 10*3/uL (ref 0.1–1.0)
Monocytes Relative: 13 %
Neutro Abs: 2.6 10*3/uL (ref 1.7–7.7)
Neutrophils Relative %: 66 %
Platelet Count: 217 10*3/uL (ref 150–400)
RBC: 3.8 MIL/uL — ABNORMAL LOW (ref 3.87–5.11)
RDW: 12.3 % (ref 11.5–15.5)
WBC Count: 3.9 10*3/uL — ABNORMAL LOW (ref 4.0–10.5)
nRBC: 0 % (ref 0.0–0.2)

## 2023-03-07 LAB — CMP (CANCER CENTER ONLY)
ALT: 13 U/L (ref 0–44)
AST: 15 U/L (ref 15–41)
Albumin: 4.6 g/dL (ref 3.5–5.0)
Alkaline Phosphatase: 58 U/L (ref 38–126)
Anion gap: 5 (ref 5–15)
BUN: 12 mg/dL (ref 6–20)
CO2: 31 mmol/L (ref 22–32)
Calcium: 9.9 mg/dL (ref 8.9–10.3)
Chloride: 105 mmol/L (ref 98–111)
Creatinine: 0.91 mg/dL (ref 0.44–1.00)
GFR, Estimated: >60 mL/min (ref >60)
Glucose, Bld: 102 mg/dL — ABNORMAL HIGH (ref 70–99)
Potassium: 3.9 mmol/L (ref 3.5–5.1)
Sodium: 141 mmol/L (ref 135–145)
Total Bilirubin: 0.7 mg/dL (ref 0.3–1.2)
Total Protein: 7.3 g/dL (ref 6.5–8.1)

## 2023-03-07 LAB — RAD ONC ARIA SESSION SUMMARY
Course Elapsed Days: 36
Plan Fractions Treated to Date: 1
Plan Prescribed Dose Per Fraction: 2 Gy
Plan Total Fractions Prescribed: 7
Plan Total Prescribed Dose: 14 Gy
Reference Point Dosage Given to Date: 2 Gy
Reference Point Session Dosage Given: 2 Gy
Session Number: 24

## 2023-03-07 NOTE — Progress Notes (Signed)
Subjective:     Patient ID: Maureen Key, female    DOB: April 13, 1981, 42 y.o.   MRN: [id number]  Chief Complaint  Patient presents with   Medical Management of Chronic Issues    3 month f/u    HPI  Discussed the use of AI scribe software for clinical note transcription with the patient, who gave verbal consent to proceed.  History of Present Illness         She is scheduled to have labs at her oncologist this morning.  No concerns for me.  Under the care of her oncologist.      Health Maintenance Due  Topic Date Due   Medicare Annual Wellness (AWV)  Never done   Hepatitis C Screening  Never done   INFLUENZA VACCINE  02/09/2023    Past Medical History:  Diagnosis Date   Anxiety 2008   Blood transfusion without reported diagnosis    last PPH delivery   History of placenta accreta in prior pregnancy, currently pregnant    History of sepsis    Oligodendroglioma (HCC)    Sickle cell trait (HCC)     Past Surgical History:  Procedure Laterality Date   ABDOMINAL HYSTERECTOMY  05/12/2017   Procedure: HYSTERECTOMY ABDOMINAL;  Surgeon: Tilda Burrow, MD;  Location: [facility] BIRTH[facility] SUIT[facility];  Service: Obstetrics;;   APPLICATION OF CRANIAL NAVIGATION N/A 02/04/2020   Procedure: APPLICATION OF CRANIAL NAVIGATION;  Surgeon: Jadene Pierini, MD;  Location: [facility] OR;  Service: Neurosurgery;  Laterality: N/A;  APPLICATION OF CRANIAL NAVIGATION   BRAIN SURGERY  01/05/2020   CERVICAL CONE BIOPSY     CESAREAN SECTION     CESAREAN SECTION N/A 05/12/2017   Procedure: CESAREAN SECTION WITH Hysterectomy;  Surgeon: Tilda Burrow, MD;  Location: [facility] BIRTHING SUITES;  Service: Obstetrics;  Laterality: N/A;   CRANIOTOMY Left 02/04/2020   Procedure: LEFT CRANIOTOMY FOR TUMOR RESECTION;  Surgeon: Jadene Pierini, MD;  Location: [facility] OR;  Service: Neurosurgery;  Laterality: Left;  LEFT CRANIOTOMY FOR TUMOR RESECTION   TIBIA IM NAIL INSERTION Right 12/13/2019   Procedure:  INTRAMEDULLARY (IM) NAIL TIBIAL;  Surgeon: Roby Lofts, MD;  Location: [facility] OR;  Service: Orthopedics;  Laterality: Right;    Family History  Problem Relation Age of Onset   Hypertension Mother    HIV/AIDS Father    Cancer Father        lung   Diabetes Sister    Diabetes Maternal Aunt    Diabetes Maternal Uncle    Cancer Paternal Aunt    Cancer Paternal Uncle    Diabetes Maternal Grandmother    Alzheimer's disease Paternal Grandmother     Social History   Socioeconomic History   Marital status: Single    Spouse name: Not on file   Number of children: 4   Years of education: Not on file   Highest education level: 12th grade  Occupational History   Not on file  Tobacco Use   Smoking status: Former    Current packs/day: 0.00    Average packs/day: 0.3 packs/day for 15.0 years (3.8 ttl pk-yrs)    Types: Cigarettes    Start date: 03/08/2002    Quit date: 03/08/2017    Years since quitting: 6.0   Smokeless tobacco: Never   Tobacco comments:    3 cigarettes a day  Vaping Use   Vaping status: Never Used  Substance and Sexual Activity   Alcohol use: Not Currently    Alcohol/week: 1.0  standard drink of alcohol    Types: 1 Glasses of wine per week    Comment: On occasions   Drug use: Yes    Frequency: 2.0 times per week    Types: Marijuana    Comment: last used 04/09/17   Sexual activity: Yes    Birth control/protection: Condom, None  Other Topics Concern   Not on file  Social History Narrative   Not on file   Social Determinants of Health   Financial Resource Strain: Low Risk  (01/10/2023)   Overall Financial Resource Strain (CARDIA)    Difficulty of Paying Living Expenses: Not hard at all  Food Insecurity: No Food Insecurity (01/17/2023)   Hunger Vital Sign    Worried About Running Out of Food in the Last Year: Never true    Ran Out of Food in the Last Year: Never true  Recent Concern: Food Insecurity - Food Insecurity Present (11/28/2022)   Hunger Vital Sign     Worried About Running Out of Food in the Last Year: Sometimes true    Ran Out of Food in the Last Year: Sometimes true  Transportation Needs: No Transportation Needs (01/17/2023)   PRAPARE - Administrator, Civil Service (Medical): No    Lack of Transportation (Non-Medical): No  Physical Activity: Insufficiently Active (01/10/2023)   Exercise Vital Sign    Days of Exercise per Week: 3 days    Minutes of Exercise per Session: 30 min  Stress: No Stress Concern Present (01/10/2023)   [employer] of Occupational Health - Occupational Stress Questionnaire    Feeling of Stress : Not at all  Social Connections: Moderately Isolated (01/10/2023)   Social Connection and Isolation Panel [NHANES]    Frequency of Communication with Friends and Family: More than three times a week    Frequency of Social Gatherings with Friends and Family: Twice a week    Attends Religious Services: 1 to 4 times per year    Active Member of [employer] or Organizations: No    Attends Banker Meetings: Never    Marital Status: Never married  Intimate Partner Violence: Not At Risk (01/17/2023)   Humiliation, Afraid, Rape, and Kick questionnaire    Fear of Current or Ex-Partner: No    Emotionally Abused: No    Physically Abused: No    Sexually Abused: No    Outpatient Medications Prior to Visit  Medication Sig Dispense Refill   acetaminophen (TYLENOL) 500 MG tablet Take 1 tablet (500 mg total) by mouth every 6 (six) hours as needed. 30 tablet 0   cetirizine (ZYRTEC) 10 MG tablet Take 1 tablet (10 mg total) by mouth daily. 10 tablet 0   dexamethasone (DECADRON) 4 MG tablet Take 1 tablet (4 mg total) by mouth daily. 5 tablet 0   metoCLOPramide (REGLAN) 10 MG tablet Take 1 tablet (10 mg total) by mouth every 8 (eight) hours as needed (migraine headache). 30 tablet 0   ondansetron (ZOFRAN) 8 MG tablet Take 1 tablet (8 mg total) by mouth every 8 (eight) hours as needed for nausea or vomiting. May take 30-60  minutes prior to Temodar administration if nausea/vomiting occurs as needed. 30 tablet 1   propranolol (INDERAL) 40 MG tablet Take 1 tablet (40 mg total) by mouth 2 (two) times daily. 60 tablet 3   SUMAtriptan (IMITREX) 100 MG tablet Take 1 tablet (100 mg total) by mouth every 2 (two) hours as needed for migraine. May repeat in 2 hours if headache  persists or recurs. 10 tablet 0   temozolomide (TEMODAR) 100 MG capsule Take 1 capsule (100 mg total) by mouth daily. May take on an empty stomach to decrease nausea & vomiting. 42 capsule 0   VITAMIN D PO Take by mouth.     Vitamin D, Ergocalciferol, (DRISDOL) 1.25 MG (50000 UNIT) CAPS capsule Take 1 capsule (50,000 Units total) by mouth every 7 (seven) days. ONCE WEEKLY (Patient not taking: Reported on 03/07/2023) 4 capsule 2   No facility-administered medications prior to visit.    No Known Allergies  ROS     Objective:    Physical Exam   BP 98/66 (BP Location: Left Arm, Patient Position: Sitting, Cuff Size: Normal)   Pulse 70   Temp 97.6 F (36.4 C) (Temporal)   Ht 5' (1.524 m)   Wt 114 lb (51.7 kg)   LMP 08/05/2016   SpO2 97%   BMI 22.26 kg/m  Wt Readings from Last 3 Encounters:  03/07/23 114 lb (51.7 kg)  02/28/23 117 lb 3.2 oz (53.2 kg)  02/14/23 116 lb 1.6 oz (52.7 kg)       Assessment & Plan:   Problem List Items Addressed This Visit   None Visit Diagnoses     Erroneous encounter - disregard    -  Primary       I have discontinued Allayah A. Strohm's Vitamin D (Ergocalciferol). I am also having her maintain her acetaminophen, cetirizine, temozolomide, propranolol, dexamethasone, SUMAtriptan, ondansetron, metoCLOPramide, and VITAMIN D PO.  No orders of the defined types were placed in this encounter.

## 2023-03-08 ENCOUNTER — Other Ambulatory Visit: Payer: Self-pay

## 2023-03-08 ENCOUNTER — Ambulatory Visit: Admission: RE | Admit: 2023-03-08 | Payer: Medicare Other | Source: Ambulatory Visit

## 2023-03-08 DIAGNOSIS — Z510 Encounter for antineoplastic radiation therapy: Secondary | ICD-10-CM | POA: Diagnosis not present

## 2023-03-08 LAB — RAD ONC ARIA SESSION SUMMARY
Course Elapsed Days: 37
Plan Fractions Treated to Date: 2
Plan Prescribed Dose Per Fraction: 2 Gy
Plan Total Fractions Prescribed: 7
Plan Total Prescribed Dose: 14 Gy
Reference Point Dosage Given to Date: 4 Gy
Reference Point Session Dosage Given: 2 Gy
Session Number: 25

## 2023-03-09 ENCOUNTER — Other Ambulatory Visit: Payer: Self-pay

## 2023-03-09 ENCOUNTER — Ambulatory Visit
Admission: RE | Admit: 2023-03-09 | Discharge: 2023-03-09 | Disposition: A | Discharge status: Home / Self Care | Payer: Medicare Other | Source: Ambulatory Visit | Attending: Radiation Oncology | Admitting: Radiation Oncology

## 2023-03-09 DIAGNOSIS — Z510 Encounter for antineoplastic radiation therapy: Secondary | ICD-10-CM | POA: Diagnosis not present

## 2023-03-09 LAB — RAD ONC ARIA SESSION SUMMARY
Course Elapsed Days: 38
Plan Fractions Treated to Date: 3
Plan Prescribed Dose Per Fraction: 2 Gy
Plan Total Fractions Prescribed: 7
Plan Total Prescribed Dose: 14 Gy
Reference Point Dosage Given to Date: 6 Gy
Reference Point Session Dosage Given: 2 Gy
Session Number: 26

## 2023-03-10 ENCOUNTER — Other Ambulatory Visit (HOSPITAL_COMMUNITY): Payer: Self-pay

## 2023-03-10 ENCOUNTER — Ambulatory Visit
Admission: RE | Admit: 2023-03-10 | Discharge: 2023-03-10 | Disposition: A | Discharge status: Home / Self Care | Payer: Medicare Other | Source: Ambulatory Visit | Attending: Radiation Oncology | Admitting: Radiation Oncology

## 2023-03-10 ENCOUNTER — Other Ambulatory Visit: Payer: Self-pay

## 2023-03-10 ENCOUNTER — Other Ambulatory Visit: Payer: Self-pay | Admitting: *Deleted

## 2023-03-10 ENCOUNTER — Ambulatory Visit: Payer: Medicare Other

## 2023-03-10 DIAGNOSIS — Z510 Encounter for antineoplastic radiation therapy: Secondary | ICD-10-CM | POA: Diagnosis not present

## 2023-03-10 LAB — RAD ONC ARIA SESSION SUMMARY
Course Elapsed Days: 39
Plan Fractions Treated to Date: 4
Plan Prescribed Dose Per Fraction: 2 Gy
Plan Total Fractions Prescribed: 7
Plan Total Prescribed Dose: 14 Gy
Reference Point Dosage Given to Date: 8 Gy
Reference Point Session Dosage Given: 2 Gy
Session Number: 27

## 2023-03-10 MED ORDER — DEXAMETHASONE 1 MG PO TABS
1.0000 mg | ORAL_TABLET | Freq: Every day | ORAL | 0 refills | Status: DC
  Filled 2023-03-10: qty 30, 30d supply, fill #0

## 2023-03-14 ENCOUNTER — Inpatient Hospital Stay (HOSPITAL_BASED_OUTPATIENT_CLINIC_OR_DEPARTMENT_OTHER): Payer: Medicare Other | Admitting: Internal Medicine

## 2023-03-14 ENCOUNTER — Other Ambulatory Visit: Payer: Self-pay

## 2023-03-14 ENCOUNTER — Ambulatory Visit: Payer: Medicare Other

## 2023-03-14 ENCOUNTER — Inpatient Hospital Stay: Payer: Medicare Other | Attending: Internal Medicine

## 2023-03-14 ENCOUNTER — Ambulatory Visit
Admission: RE | Admit: 2023-03-14 | Discharge: 2023-03-14 | Disposition: A | Discharge status: Home / Self Care | Payer: Medicare Other | Source: Ambulatory Visit | Attending: Radiation Oncology | Admitting: Radiation Oncology

## 2023-03-14 VITALS — BP 126/86 | HR 94 | Temp 97.3°F | Resp 17 | Wt 114.2 lb

## 2023-03-14 DIAGNOSIS — Z79899 Other long term (current) drug therapy: Secondary | ICD-10-CM | POA: Insufficient documentation

## 2023-03-14 DIAGNOSIS — C711 Malignant neoplasm of frontal lobe: Secondary | ICD-10-CM | POA: Insufficient documentation

## 2023-03-14 DIAGNOSIS — Z510 Encounter for antineoplastic radiation therapy: Secondary | ICD-10-CM | POA: Insufficient documentation

## 2023-03-14 DIAGNOSIS — C719 Malignant neoplasm of brain, unspecified: Secondary | ICD-10-CM

## 2023-03-14 DIAGNOSIS — Z801 Family history of malignant neoplasm of trachea, bronchus and lung: Secondary | ICD-10-CM | POA: Insufficient documentation

## 2023-03-14 DIAGNOSIS — D573 Sickle-cell trait: Secondary | ICD-10-CM | POA: Insufficient documentation

## 2023-03-14 DIAGNOSIS — Z809 Family history of malignant neoplasm, unspecified: Secondary | ICD-10-CM | POA: Insufficient documentation

## 2023-03-14 DIAGNOSIS — Z87891 Personal history of nicotine dependence: Secondary | ICD-10-CM | POA: Insufficient documentation

## 2023-03-14 LAB — CBC WITH DIFFERENTIAL (CANCER CENTER ONLY)
Abs Immature Granulocytes: 0 10*3/uL (ref 0.00–0.07)
Basophils Absolute: 0 10*3/uL (ref 0.0–0.1)
Basophils Relative: 1 %
Eosinophils Absolute: 0.1 10*3/uL (ref 0.0–0.5)
Eosinophils Relative: 3 %
HCT: 37.7 % (ref 36.0–46.0)
Hemoglobin: 13.4 g/dL (ref 12.0–15.0)
Immature Granulocytes: 0 %
Lymphocytes Relative: 13 %
Lymphs Abs: 0.5 10*3/uL — ABNORMAL LOW (ref 0.7–4.0)
MCH: 33.4 pg (ref 26.0–34.0)
MCHC: 35.5 g/dL (ref 30.0–36.0)
MCV: 94 fL (ref 80.0–100.0)
Monocytes Absolute: 0.5 10*3/uL (ref 0.1–1.0)
Monocytes Relative: 12 %
Neutro Abs: 2.9 10*3/uL (ref 1.7–7.7)
Neutrophils Relative %: 71 %
Platelet Count: 201 10*3/uL (ref 150–400)
RBC: 4.01 MIL/uL (ref 3.87–5.11)
RDW: 13.2 % (ref 11.5–15.5)
WBC Count: 4.1 10*3/uL (ref 4.0–10.5)
nRBC: 0 % (ref 0.0–0.2)

## 2023-03-14 LAB — RAD ONC ARIA SESSION SUMMARY
Course Elapsed Days: 43
Plan Fractions Treated to Date: 5
Plan Prescribed Dose Per Fraction: 2 Gy
Plan Total Fractions Prescribed: 7
Plan Total Prescribed Dose: 14 Gy
Reference Point Dosage Given to Date: 10 Gy
Reference Point Session Dosage Given: 2 Gy
Session Number: 28

## 2023-03-14 LAB — CMP (CANCER CENTER ONLY)
ALT: 15 U/L (ref 0–44)
AST: 16 U/L (ref 15–41)
Albumin: 4.7 g/dL (ref 3.5–5.0)
Alkaline Phosphatase: 57 U/L (ref 38–126)
Anion gap: 6 (ref 5–15)
BUN: 10 mg/dL (ref 6–20)
CO2: 26 mmol/L (ref 22–32)
Calcium: 9.9 mg/dL (ref 8.9–10.3)
Chloride: 107 mmol/L (ref 98–111)
Creatinine: 0.81 mg/dL (ref 0.44–1.00)
GFR, Estimated: >60 mL/min (ref >60)
Glucose, Bld: 87 mg/dL (ref 70–99)
Potassium: 4.3 mmol/L (ref 3.5–5.1)
Sodium: 139 mmol/L (ref 135–145)
Total Bilirubin: 0.5 mg/dL (ref 0.3–1.2)
Total Protein: 7.5 g/dL (ref 6.5–8.1)

## 2023-03-14 NOTE — Progress Notes (Signed)
[facility] [address]  [place], [place] [address] [phone]   Interval Evaluation  Date of Service: 03/14/23 Patient Name: Maureen Key Patient MRN: [id number] Patient DOB: [birth year] Provider: Henreitta Leber, MD  Identifying Statement:  [name] is a 42 y.o. 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 Maurice Small.  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  IDH 1/2 Mutated.  1p/19q co-deleted  TERT Unknown   Interval History:  Maureen Key presents for follow up today, now having completed 6 weeks of IMRT and Temodar.  Migraines remain well controlled with propanolol.  Rarely needing breakthrough analegsia.  Otherwise doing well with treatment ,maintaining activity level at home and work.    H+P (12/20/19) Patient presented to medical attention after trauma, fall while roller skating and fracturing tibia one week ago.  This led to trauma evaluation and CNS imaging which demonstrated left frontal mass ~5cm.  She denies any neurologic symptoms, no headaches or seizures.  Currently in cast and wheelchair following tibial fixation over the weekend, healing well.   Medications: Current Outpatient Medications on File Prior to Visit  Medication Sig Dispense Refill   acetaminophen (TYLENOL) 500 MG tablet Take 1 tablet (500 mg total) by mouth every 6 (six) hours as needed. 30 tablet 0   cetirizine (ZYRTEC) 10 MG tablet Take 1 tablet (10 mg total) by mouth daily. 10 tablet 0    dexamethasone (DECADRON) 1 MG tablet Take 1 tablet (1 mg total) by mouth daily. 30 tablet 0   metoCLOPramide (REGLAN) 10 MG tablet Take 1 tablet (10 mg total) by mouth every 8 (eight) hours as needed (migraine headache). 30 tablet 0   ondansetron (ZOFRAN) 8 MG tablet Take 1 tablet (8 mg total) by mouth every 8 (eight) hours as needed for nausea or vomiting. May take 30-60 minutes prior to Temodar administration if nausea/vomiting occurs as needed. 30 tablet 1   propranolol (INDERAL) 40 MG tablet Take 1 tablet (40 mg total) by mouth 2 (two) times daily. 60 tablet 3   SUMAtriptan (IMITREX) 100 MG tablet Take 1 tablet (100 mg total) by mouth every 2 (two) hours as needed for migraine. May repeat in 2 hours if headache persists or recurs. 10 tablet 0   VITAMIN D PO Take by mouth.     temozolomide (TEMODAR) 100 MG capsule Take 1 capsule (100 mg total) by mouth daily. May take on an empty stomach to decrease nausea & vomiting. (Patient not taking: Reported on 03/14/2023) 42 capsule 0   No current facility-administered medications on file prior to visit.    Allergies: No Known Allergies Past Medical History:  Past Medical History:  Diagnosis Date   Anxiety 2008   Blood transfusion without reported diagnosis    last PPH delivery   History of placenta accreta in prior pregnancy, currently pregnant    History of sepsis    Oligodendroglioma (HCC)    Sickle cell trait (HCC)    Past Surgical History:  Past Surgical History:  Procedure Laterality Date   ABDOMINAL HYSTERECTOMY  05/12/2017   Procedure: HYSTERECTOMY ABDOMINAL;  Surgeon: Tilda Burrow, MD;  Location: [facility] BIRTHING SUITES;  Service: Obstetrics;;   APPLICATION OF CRANIAL NAVIGATION N/A 02/04/2020   Procedure: APPLICATION OF CRANIAL NAVIGATION;  Surgeon: Jadene Pierini, MD;  Location: [facility];  Service: Neurosurgery;  Laterality: N/A;  APPLICATION OF CRANIAL NAVIGATION   BRAIN SURGERY  01/05/2020   CERVICAL CONE BIOPSY     CESAREAN  SECTION     CESAREAN SECTION N/A 05/12/2017   Procedure: CESAREAN SECTION WITH Hysterectomy;  Surgeon: Tilda Burrow, MD;  Location: [facility] BIRTHING SUIT[facility];  Service: Obstetrics;  Laterality: N/A;   CRANIOTOMY Left 02/04/2020   Procedure: LEFT CRANIOTOMY FOR TUMOR RESECTION;  Surgeon: Jadene Pierini, MD;  Location: [facility];  Service: Neurosurgery;  Laterality: Left;  LEFT CRANIOTOMY FOR TUMOR RESECTION   TIBIA IM NAIL INSERTION Right 12/13/2019   Procedure: INTRAMEDULLARY (IM) NAIL TIBIAL;  Surgeon: Roby Lofts, MD;  Location: [facility];  Service: Orthopedics;  Laterality: Right;   Social History:  Social History   Socioeconomic History   Marital status: Single    Spouse name: Not on file   Number of children: 4   Years of education: Not on file   Highest education level: 12th grade  Occupational History   Not on file  Tobacco Use   Smoking status: Former    Current packs/day: 0.00    Average packs/day: 0.3 packs/day for 15.0 years (3.8 ttl pk-yrs)    Types: Cigarettes    Start date: 03/08/2002    Quit date: 03/08/2017    Years since quitting: 6.0   Smokeless tobacco: Never   Tobacco comments:    3 cigarettes a day  Vaping Use   Vaping status: Never Used  Substance and Sexual Activity   Alcohol use: Not Currently    Alcohol/week: 1.0 standard drink of alcohol    Types: 1 Glasses of wine per week    Comment: On occasions   Drug use: Yes    Frequency: 2.0 times per week    Types: Marijuana    Comment: last used 04/09/17   Sexual activity: Yes    Birth control/protection: Condom, None  Other Topics Concern   Not on file  Social History Narrative   Not on file   Social Determinants of Health   Financial Resource Strain: Low Risk  (01/10/2023)   Overall Financial Resource Strain (CARDIA)    Difficulty of Paying Living Expenses: Not hard at all  Food Insecurity: No Food Insecurity (01/17/2023)   Hunger Vital Sign    Worried About Running Out of Food in the Last Year: Never  true    Ran Out of Food in the Last Year: Never true  Recent Concern: Food Insecurity - Food Insecurity Present (11/28/2022)   Hunger Vital Sign    Worried About Running Out of Food in the Last Year: Sometimes true    Ran Out of Food in the Last Year: Sometimes true  Transportation Needs: No Transportation Needs (01/17/2023)   PRAPARE - Administrator, Civil Service (Medical): No    Lack of Transportation (Non-Medical): No  Physical Activity: Insufficiently Active (01/10/2023)   Exercise Vital Sign    Days of Exercise per Week: 3 days    Minutes of Exercise per Session: 30 min  Stress: No Stress Concern Present (01/10/2023)   [employer] of Occupational Health - Occupational Stress Questionnaire    Feeling of Stress : Not at all  Social Connections:  Moderately Isolated (01/10/2023)   Social Connection and Isolation Panel [NHANES]    Frequency of Communication with Friends and Family: More than three times a week    Frequency of Social Gatherings with Friends and Family: Twice a week    Attends Religious Services: 1 to 4 times per year    Active Member of [employer] or Organizations: No    Attends Banker Meetings: Never    Marital Status: Never married  Intimate Partner Violence: Not At Risk (01/17/2023)   Humiliation, Afraid, Rape, and Kick questionnaire    Fear of Current or Ex-Partner: No    Emotionally Abused: No    Physically Abused: No    Sexually Abused: No   Family History:  Family History  Problem Relation Age of Onset   Hypertension Mother    HIV/AIDS Father    Cancer Father        lung   Diabetes Sister    Diabetes Maternal Aunt    Diabetes Maternal Uncle    Cancer Paternal Aunt    Cancer Paternal Uncle    Diabetes Maternal Grandmother    Alzheimer's disease Paternal Grandmother     Review of Systems: Constitutional: Doesn't report fevers, chills or abnormal weight loss Eyes: Doesn't report blurriness of vision Ears, nose, mouth, throat, and  face: Doesn't report sore throat Respiratory: Doesn't report cough, dyspnea or wheezes Cardiovascular: Doesn't report palpitation, chest discomfort  Gastrointestinal:  Doesn't report nausea, constipation, diarrhea GU: Doesn't report incontinence Skin: Doesn't report skin rashes Neurological: Per HPI Musculoskeletal: Doesn't report joint pain Behavioral/Psych: Doesn't report anxiety  Physical Exam: Vitals:   03/14/23 0947  BP: 126/86  Pulse: 94  Resp: 17  Temp: (!) 97.3 F (36.3 C)  SpO2: 99%    KPS: 80. General: Alert, cooperative, pleasant, in no acute distress Head: Normal EENT: No conjunctival injection or scleral icterus.  Lungs: Resp effort normal Cardiac: Regular rate Abdomen: Non-distended abdomen Skin: No rashes cyanosis or petechiae. Extremities: No clubbing or edema  Neurologic Exam: Mental Status: Awake, alert, attentive to examiner. Oriented to self and environment. Language is fluent with intact comprehension.  Impaired recall, age advanced psychomotor slowing Cranial Nerves: Visual acuity is grossly normal. Visual fields are full. Extra-ocular movements intact. No ptosis. Face is symmetric Motor: Tone and bulk are normal. Power is full in both arms and legs. Reflexes are symmetric, no pathologic reflexes present.  Sensory: Intact to light touch Gait: Orthopedic limitation only  Labs: I have reviewed the data as listed    Component Value Date/Time   NA 141 03/07/2023 0929   NA 143 01/03/2018 0949   K 3.9 03/07/2023 0929   CL 105 03/07/2023 0929   CO2 31 03/07/2023 0929   GLUCOSE 102 (H) 03/07/2023 0929   BUN 12 03/07/2023 0929   BUN 14 01/03/2018 0949   CREATININE 0.91 03/07/2023 0929   CALCIUM 9.9 03/07/2023 0929   PROT 7.3 03/07/2023 0929   PROT 7.6 01/03/2018 0949   ALBUMIN 4.6 03/07/2023 0929   ALBUMIN 4.8 01/03/2018 0949   AST 15 03/07/2023 0929   ALT 13 03/07/2023 0929   ALKPHOS 58 03/07/2023 0929   BILITOT 0.7 03/07/2023 0929   GFRNONAA  >60 03/07/2023 0929   GFRAA >60 01/31/2020 1340   Lab Results  Component Value Date   WBC 4.1 03/14/2023   NEUTROABS 2.9 03/14/2023   HGB 13.4 03/14/2023   HCT 37.7 03/14/2023   MCV 94.0 03/14/2023   PLT 201 03/14/2023  Assessment/Plan Oligodendroglioma, IDH gene mutant and 1p/19q-codeleted (HCC)  Aslyn A Moneypenny is clinically stable today, now having completed 4 weeks of IMRT and Temodar.  Labs are within normal limits.  Recommended continuing propanolol 40mg  BID for headache prevention.    We ultimately recommended continuing with course of intensity modulated radiation therapy and concurrent daily Temozolomide.  Radiation will be administered Mon-Fri over 6 weeks, Temodar will be dosed at 75mg /m2 to be given daily over 42 days.  We reviewed side effects of temodar, including fatigue, nausea/vomiting, constipation, and cytopenias.  Chemotherapy should be held for the following:  ANC less than 1,000  Platelets less than 100,000  LFT or creatinine greater than 2x ULN  If clinical concerns/contraindications develop  Every 2 weeks during radiation, labs will be checked accompanied by a clinical evaluation in the brain tumor clinic.  We ask that Latashia A Sucharski return to clinic in 1 months following post-RT brain MRI, or sooner as needed.  All questions were answered. The patient knows to call the clinic with any problems, questions or concerns. No barriers to learning were detected.  I have spent a total of 30 minutes of face-to-face and non-face-to-face time, excluding clinical staff time, preparing to see patient, ordering tests and/or medications, counseling the patient, and independently interpreting results and communicating results to the patient/family/caregiver    Henreitta Leber, MD Medical Director of Neuro-Oncology [facility] 03/14/23 9:44 AM

## 2023-03-15 ENCOUNTER — Ambulatory Visit: Payer: Medicare Other

## 2023-03-15 ENCOUNTER — Other Ambulatory Visit: Payer: Self-pay

## 2023-03-15 ENCOUNTER — Ambulatory Visit
Admission: RE | Admit: 2023-03-15 | Discharge: 2023-03-15 | Disposition: A | Discharge status: Home / Self Care | Payer: Medicare Other | Source: Ambulatory Visit | Attending: Radiation Oncology | Admitting: Radiation Oncology

## 2023-03-15 DIAGNOSIS — Z809 Family history of malignant neoplasm, unspecified: Secondary | ICD-10-CM | POA: Diagnosis not present

## 2023-03-15 DIAGNOSIS — Z801 Family history of malignant neoplasm of trachea, bronchus and lung: Secondary | ICD-10-CM | POA: Insufficient documentation

## 2023-03-15 DIAGNOSIS — D573 Sickle-cell trait: Secondary | ICD-10-CM | POA: Diagnosis not present

## 2023-03-15 DIAGNOSIS — Z510 Encounter for antineoplastic radiation therapy: Secondary | ICD-10-CM | POA: Diagnosis present

## 2023-03-15 DIAGNOSIS — C711 Malignant neoplasm of frontal lobe: Secondary | ICD-10-CM | POA: Insufficient documentation

## 2023-03-15 DIAGNOSIS — Z87891 Personal history of nicotine dependence: Secondary | ICD-10-CM | POA: Diagnosis not present

## 2023-03-15 DIAGNOSIS — Z79899 Other long term (current) drug therapy: Secondary | ICD-10-CM | POA: Diagnosis not present

## 2023-03-15 LAB — RAD ONC ARIA SESSION SUMMARY
Course Elapsed Days: 44
Plan Fractions Treated to Date: 6
Plan Prescribed Dose Per Fraction: 2 Gy
Plan Total Fractions Prescribed: 7
Plan Total Prescribed Dose: 14 Gy
Reference Point Dosage Given to Date: 12 Gy
Reference Point Session Dosage Given: 2 Gy
Session Number: 29

## 2023-03-16 ENCOUNTER — Other Ambulatory Visit: Payer: Self-pay

## 2023-03-16 ENCOUNTER — Ambulatory Visit
Admission: RE | Admit: 2023-03-16 | Discharge: 2023-03-16 | Disposition: A | Discharge status: Home / Self Care | Payer: Medicare Other | Source: Ambulatory Visit | Attending: Radiation Oncology | Admitting: Radiation Oncology

## 2023-03-16 DIAGNOSIS — Z510 Encounter for antineoplastic radiation therapy: Secondary | ICD-10-CM | POA: Diagnosis not present

## 2023-03-16 LAB — RAD ONC ARIA SESSION SUMMARY
Course Elapsed Days: 45
Plan Fractions Treated to Date: 7
Plan Prescribed Dose Per Fraction: 2 Gy
Plan Total Fractions Prescribed: 7
Plan Total Prescribed Dose: 14 Gy
Reference Point Dosage Given to Date: 14 Gy
Reference Point Session Dosage Given: 2 Gy
Session Number: 30

## 2023-03-17 NOTE — Radiation Completion Notes (Signed)
  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   Intent: Curative     ==========DELIVERED PLANS==========  First Treatment Date: 2023-01-30 - Last Treatment Date: 2023-03-16   Plan Name: Brain_L Site: Brain Technique: IMRT Mode: Photon Dose Per Fraction: 2 Gy Prescribed Dose (Delivered / Prescribed): 46 Gy / 46 Gy Prescribed Fxs (Delivered / Prescribed): 23 / 23   Plan Name: Brain_L_Bst Site: Brain Technique: IMRT Mode: Photon Dose Per Fraction: 2 Gy Prescribed Dose (Delivered / Prescribed): 14 Gy / 14 Gy Prescribed Fxs (Delivered / Prescribed): 7 / 7     ==========ON TREATMENT VISIT DATES========== 2023-02-03, 2023-02-10, 2023-02-17, 2023-02-24, 2023-03-03, 2023-03-10  See weekly On Treatment Notes in Epic for details. The patient tolerated radiation. She developed fatigue and anticipated skin changes in the treatment field.   The patient will receive a call in about one month from the radiation oncology department. She will continue follow up with Dr. Barbaraann Cao as well.      Osker Mason, PAC

## 2023-03-18 ENCOUNTER — Other Ambulatory Visit: Payer: Self-pay

## 2023-03-25 ENCOUNTER — Other Ambulatory Visit: Payer: Self-pay

## 2023-03-29 ENCOUNTER — Other Ambulatory Visit (INDEPENDENT_AMBULATORY_CARE_PROVIDER_SITE_OTHER): Payer: Medicare Other | Admitting: *Deleted

## 2023-03-29 ENCOUNTER — Other Ambulatory Visit (HOSPITAL_COMMUNITY)
Admission: RE | Admit: 2023-03-29 | Discharge: 2023-03-29 | Disposition: A | Discharge status: Home / Self Care | Payer: Medicare Other | Source: Ambulatory Visit | Attending: Obstetrics & Gynecology | Admitting: Obstetrics & Gynecology

## 2023-03-29 DIAGNOSIS — N898 Other specified noninflammatory disorders of vagina: Secondary | ICD-10-CM | POA: Diagnosis present

## 2023-03-29 NOTE — Progress Notes (Signed)
   NURSE VISIT- VAGINITIS/STD/POC  SUBJECTIVE:  Maureen Key is a 42 y.o. [id number] GYN patientfemale here for a vaginal swab for vaginitis screening.  She reports the following symptoms: odor after sex . Denies abnormal vaginal bleeding, significant pelvic pain, fever, or UTI symptoms.  OBJECTIVE:  LMP 08/05/2016   Appears well, in no apparent distress  ASSESSMENT: Vaginal swab for vaginitis screening  PLAN: Self-collected vaginal probe for Gonorrhea, Chlamydia, Trichomonas, Bacterial Vaginosis, Yeast sent to lab Treatment: to be determined once results are received Follow-up as needed if symptoms persist/worsen, or new symptoms develop  Annamarie Dawley  03/29/2023 1:36 PM

## 2023-03-30 ENCOUNTER — Other Ambulatory Visit: Payer: Self-pay | Admitting: Adult Health

## 2023-03-30 LAB — CERVICOVAGINAL ANCILLARY ONLY
Bacterial Vaginitis (gardnerella): POSITIVE — AB
Candida Glabrata: NEGATIVE
Candida Vaginitis: NEGATIVE
Chlamydia: NEGATIVE
Comment: NEGATIVE
Comment: NEGATIVE
Comment: NEGATIVE
Comment: NEGATIVE
Comment: NEGATIVE
Comment: NORMAL
Neisseria Gonorrhea: NEGATIVE
Trichomonas: NEGATIVE

## 2023-03-30 MED ORDER — METRONIDAZOLE 500 MG PO TABS: 500.0000 mg | ORAL_TABLET | Freq: Two times a day (BID) | ORAL | 0 refills | Status: DC

## 2023-03-30 NOTE — Progress Notes (Signed)
+  BV on vaginal swab, will rx flagyl, no sex or alcohol while taking

## 2023-04-05 ENCOUNTER — Encounter: Payer: Self-pay | Admitting: Internal Medicine

## 2023-04-06 ENCOUNTER — Ambulatory Visit (HOSPITAL_COMMUNITY): Payer: Medicare Other

## 2023-04-09 ENCOUNTER — Ambulatory Visit (HOSPITAL_COMMUNITY)
Admission: RE | Admit: 2023-04-09 | Discharge: 2023-04-09 | Disposition: A | Discharge status: Home / Self Care | Payer: Medicare Other | Source: Ambulatory Visit | Attending: Internal Medicine | Admitting: Internal Medicine

## 2023-04-09 DIAGNOSIS — C719 Malignant neoplasm of brain, unspecified: Secondary | ICD-10-CM | POA: Diagnosis present

## 2023-04-09 MED ORDER — GADOBUTROL 1 MMOL/ML IV SOLN
5.0000 mL | Freq: Once | INTRAVENOUS | Status: AC | PRN
  Administered 2023-04-09: 5 mL via INTRAVENOUS

## 2023-04-11 ENCOUNTER — Inpatient Hospital Stay: Payer: Medicare Other | Attending: Internal Medicine | Admitting: Internal Medicine

## 2023-04-11 VITALS — BP 114/88 | HR 76 | Temp 97.9°F | Resp 20 | Wt 112.2 lb

## 2023-04-11 DIAGNOSIS — C719 Malignant neoplasm of brain, unspecified: Secondary | ICD-10-CM | POA: Diagnosis present

## 2023-04-11 NOTE — Progress Notes (Signed)
[facility] [address]  [place], [place] [address] [phone]   Interval Evaluation  Date of Service: 04/11/23 Patient Name: Maureen Key Patient MRN: [id number] Patient DOB: 1980-11-04 Provider: Henreitta Leber, MD  Identifying Statement:  Maureen Key is a 41 y.o. 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 Maurice Small.  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  IDH 1/2 Mutated.  1p/19q co-deleted  TERT Unknown   Interval History:  Maureen Key presents for follow up today, now having completed full course of IMRT and Temodar, and recent MRI study.  No new or progressive changes today.  Migraines remain well controlled with propanolol.  Rarely needing breakthrough analegsia.  Otherwise doing well with treatment, maintaining activity level at home and work.    H+P (12/20/19) Patient presented to medical attention after trauma, fall while roller skating and fracturing tibia one week ago.  This led to trauma evaluation and CNS imaging which demonstrated left frontal mass ~5cm.  She denies any neurologic symptoms, no headaches or seizures.  Currently in cast and wheelchair following tibial fixation over the weekend, healing well.   Medications: Current Outpatient Medications on File Prior to Visit  Medication Sig Dispense Refill   metroNIDAZOLE (FLAGYL) 500 MG tablet Take 1 tablet (500 mg total) by mouth 2 (two) times daily. 14 tablet 0   acetaminophen (TYLENOL) 500 MG tablet Take 1  tablet (500 mg total) by mouth every 6 (six) hours as needed. 30 tablet 0   cetirizine (ZYRTEC) 10 MG tablet Take 1 tablet (10 mg total) by mouth daily. 10 tablet 0   dexamethasone (DECADRON) 1 MG tablet Take 1 tablet (1 mg total) by mouth daily. 30 tablet 0   metoCLOPramide (REGLAN) 10 MG tablet Take 1 tablet (10 mg total) by mouth every 8 (eight) hours as needed (migraine headache). 30 tablet 0   ondansetron (ZOFRAN) 8 MG tablet Take 1 tablet (8 mg total) by mouth every 8 (eight) hours as needed for nausea or vomiting. May take 30-60 minutes prior to Temodar administration if nausea/vomiting occurs as needed. 30 tablet 1   propranolol (INDERAL) 40 MG tablet Take 1 tablet (40 mg total) by mouth 2 (two) times daily. 60 tablet 3   SUMAtriptan (IMITREX) 100 MG tablet Take 1 tablet (100 mg total) by mouth every 2 (two) hours as needed for migraine. May repeat in 2 hours if headache persists or recurs. 10 tablet 0   temozolomide (TEMODAR) 100 MG capsule Take 1 capsule (100 mg total) by mouth daily. May take on an empty stomach to decrease nausea & vomiting. (Patient not taking: Reported on 03/14/2023) 42 capsule 0   VITAMIN D PO Take by mouth.     No current facility-administered medications on file prior to visit.    Allergies: No Known Allergies Past Medical History:  Past Medical History:  Diagnosis Date   Anxiety 2008   Blood transfusion without reported diagnosis    last PPH delivery   History of placenta accreta in prior pregnancy, currently pregnant    History  of sepsis    Oligodendroglioma (HCC)    Sickle cell trait [facility])    Past Surgical History:  Past Surgical History:  Procedure Laterality Date   ABDOMINAL HYSTERECTOMY  05/12/2017   Procedure: HYSTERECTOMY ABDOMINAL;  Surgeon: Tilda Burrow, MD;  Location: [facility] BIRTHING SUITES;  Service: Obstetrics;;   APPLICATION OF CRANIAL NAVIGATION N/A 02/04/2020   Procedure: APPLICATION OF CRANIAL NAVIGATION;  Surgeon: Jadene Pierini,  MD;  Location: [facility] OR;  Service: Neurosurgery;  Laterality: N/A;  APPLICATION OF CRANIAL NAVIGATION   BRAIN SURGERY  01/05/2020   CERVICAL CONE BIOPSY     CESAREAN SECTION     CESAREAN SECTION N/A 05/12/2017   Procedure: CESAREAN SECTION WITH Hysterectomy;  Surgeon: Tilda Burrow, MD;  Location: [facility] BIRTHING SUITES;  Service: Obstetrics;  Laterality: N/A;   CRANIOTOMY Left 02/04/2020   Procedure: LEFT CRANIOTOMY FOR TUMOR RESECTION;  Surgeon: Jadene Pierini, MD;  Location: [facility] OR;  Service: Neurosurgery;  Laterality: Left;  LEFT CRANIOTOMY FOR TUMOR RESECTION   TIBIA IM NAIL INSERTION Right 12/13/2019   Procedure: INTRAMEDULLARY (IM) NAIL TIBIAL;  Surgeon: Roby Lofts, MD;  Location: [facility] OR;  Service: Orthopedics;  Laterality: Right;   Social History:  Social History   Socioeconomic History   Marital status: Single    Spouse name: Not on file   Number of children: 4   Years of education: Not on file   Highest education level: 12th grade  Occupational History   Not on file  Tobacco Use   Smoking status: Former    Current packs/day: 0.00    Average packs/day: 0.3 packs/day for 15.0 years (3.8 ttl pk-yrs)    Types: Cigarettes    Start date: 03/08/2002    Quit date: 03/08/2017    Years since quitting: 6.0   Smokeless tobacco: Never   Tobacco comments:    3 cigarettes a day  Vaping Use   Vaping status: Never Used  Substance and Sexual Activity   Alcohol use: Not Currently    Alcohol/week: 1.0 standard drink of alcohol    Types: 1 Glasses of wine per week    Comment: On occasions   Drug use: Yes    Frequency: 2.0 times per week    Types: Marijuana    Comment: last used 04/09/17   Sexual activity: Yes    Birth control/protection: Condom, None  Other Topics Concern   Not on file  Social History Narrative   Not on file   Social Determinants of Health   Financial Resource Strain: Low Risk  (01/10/2023)   Overall Financial Resource Strain (CARDIA)    Difficulty of  Paying Living Expenses: Not hard at all  Food Insecurity: No Food Insecurity (01/17/2023)   Hunger Vital Sign    Worried About Running Out of Food in the Last Year: Never true    Ran Out of Food in the Last Year: Never true  Recent Concern: Food Insecurity - Food Insecurity Present (11/28/2022)   Hunger Vital Sign    Worried About Running Out of Food in the Last Year: Sometimes true    Ran Out of Food in the Last Year: Sometimes true  Transportation Needs: No Transportation Needs (01/17/2023)   PRAPARE - Administrator, Civil Service (Medical): No    Lack of Transportation (Non-Medical): No  Physical Activity: Insufficiently Active (01/10/2023)   Exercise Vital Sign    Days of Exercise per Week: 3 days    Minutes of Exercise per Session:  30 min  Stress: No Stress Concern Present (01/10/2023)   [employer] of Occupational Health - Occupational Stress Questionnaire    Feeling of Stress : Not at all  Social Connections: Moderately Isolated (01/10/2023)   Social Connection and Isolation Panel [NHANES]    Frequency of Communication with Friends and Family: More than three times a week    Frequency of Social Gatherings with Friends and Family: Twice a week    Attends Religious Services: 1 to 4 times per year    Active Member of [employer] or Organizations: No    Attends Banker Meetings: Never    Marital Status: Never married  Intimate Partner Violence: Not At Risk (01/17/2023)   Humiliation, Afraid, Rape, and Kick questionnaire    Fear of Current or Ex-Partner: No    Emotionally Abused: No    Physically Abused: No    Sexually Abused: No   Family History:  Family History  Problem Relation Age of Onset   Hypertension Mother    HIV/AIDS Father    Cancer Father        lung   Diabetes Sister    Diabetes Maternal Aunt    Diabetes Maternal Uncle    Cancer Paternal Aunt    Cancer Paternal Uncle    Diabetes Maternal Grandmother    Alzheimer's disease Paternal Grandmother      Review of Systems: Constitutional: Doesn't report fevers, chills or abnormal weight loss Eyes: Doesn't report blurriness of vision Ears, nose, mouth, throat, and face: Doesn't report sore throat Respiratory: Doesn't report cough, dyspnea or wheezes Cardiovascular: Doesn't report palpitation, chest discomfort  Gastrointestinal:  Doesn't report nausea, constipation, diarrhea GU: Doesn't report incontinence Skin: Doesn't report skin rashes Neurological: Per HPI Musculoskeletal: Doesn't report joint pain Behavioral/Psych: Doesn't report anxiety  Physical Exam: Vitals:   04/11/23 0919  BP: 114/88  Pulse: 76  Resp: 20  Temp: 97.9 F (36.6 C)  SpO2: 100%   KPS: 80. General: Alert, cooperative, pleasant, in no acute distress Head: Normal EENT: No conjunctival injection or scleral icterus.  Lungs: Resp effort normal Cardiac: Regular rate Abdomen: Non-distended abdomen Skin: No rashes cyanosis or petechiae. Extremities: No clubbing or edema  Neurologic Exam: Mental Status: Awake, alert, attentive to examiner. Oriented to self and environment. Language is fluent with intact comprehension.  Impaired recall, age advanced psychomotor slowing Cranial Nerves: Visual acuity is grossly normal. Visual fields are full. Extra-ocular movements intact. No ptosis. Face is symmetric Motor: Tone and bulk are normal. Power is full in both arms and legs. Reflexes are symmetric, no pathologic reflexes present.  Sensory: Intact to light touch Gait: Orthopedic limitation only  Labs: I have reviewed the data as listed    Component Value Date/Time   NA 139 03/14/2023 0914   NA 143 01/03/2018 0949   K 4.3 03/14/2023 0914   CL 107 03/14/2023 0914   CO2 26 03/14/2023 0914   GLUCOSE 87 03/14/2023 0914   BUN 10 03/14/2023 0914   BUN 14 01/03/2018 0949   CREATININE 0.81 03/14/2023 0914   CALCIUM 9.9 03/14/2023 0914   PROT 7.5 03/14/2023 0914   PROT 7.6 01/03/2018 0949   ALBUMIN 4.7 03/14/2023  0914   ALBUMIN 4.8 01/03/2018 0949   AST 16 03/14/2023 0914   ALT 15 03/14/2023 0914   ALKPHOS 57 03/14/2023 0914   BILITOT 0.5 03/14/2023 0914   GFRNONAA >60 03/14/2023 0914   GFRAA >60 01/31/2020 1340   Lab Results  Component Value Date   WBC  4.1 03/14/2023   NEUTROABS 2.9 03/14/2023   HGB 13.4 03/14/2023   HCT 37.7 03/14/2023   MCV 94.0 03/14/2023   PLT 201 03/14/2023    Imaging:  [facility] Clinician Interpretation: I have personally reviewed the CNS images as listed.  My interpretation, in the context of the patient's clinical presentation, is stable disease pending official read  No results found.   Assessment/Plan Oligodendroglioma, IDH gene mutant and 1p/19q-codeleted (HCC)  Maureen Key is clinically stable today, now having completed full 6 weeks of IMRT and Temodar.  MRI  brain demonstrates stable findings, pending official read.  Recommended continuing propanolol 40mg  BID for headache prevention.    We recommended initiating treatment with Temozolomide 150 mg/m2, on for five days and off for twenty three days in twenty eight day cycles. The patient will have a complete blood count performed on days 21 and 28 of each cycle, and a comprehensive metabolic panel performed on day 28 of each cycle. Labs may need to be performed more often. Zofran will prescribed for home use for nausea/vomiting.   Chemotherapy should be held for the following:  ANC less than 1,000  Platelets less than 100,000  LFT or creatinine greater than 2x ULN  If clinical concerns/contraindications develop  She would like to defer initiation of chemotherapy at this time.  We will therefore recommend repeating MRI brain in 2 months and re-evaluating.  We ask that Maureen Key return to clinic in 2 months following post-RT brain MRI, or sooner as needed.  All questions were answered. The patient knows to call the clinic with any problems, questions or concerns. No barriers to  learning were detected.  I have spent a total of 40 minutes of face-to-face and non-face-to-face time, excluding clinical staff time, preparing to see patient, ordering tests and/or medications, counseling the patient, and independently interpreting results and communicating results to the patient/family/caregiver    Henreitta Leber, MD Medical Director of Neuro-Oncology [facility] at [place] [facility] [place] [facility] 04/11/23 9:05 AM

## 2023-04-13 ENCOUNTER — Other Ambulatory Visit: Payer: Self-pay

## 2023-04-30 ENCOUNTER — Other Ambulatory Visit: Payer: Self-pay

## 2023-05-18 ENCOUNTER — Other Ambulatory Visit: Payer: Self-pay

## 2023-06-05 ENCOUNTER — Ambulatory Visit
Admission: RE | Admit: 2023-06-05 | Discharge: 2023-06-05 | Disposition: A | Discharge status: Home / Self Care | Payer: Medicare Other | Source: Ambulatory Visit | Attending: Internal Medicine | Admitting: Internal Medicine

## 2023-06-05 NOTE — Progress Notes (Addendum)
  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  The patient did  note fatigue during radiation but has since improved. The patient did  note skin changes in the field of radiation during therapy, which is also improving. The patient is not taking dexamethasone. The patient does not have symptoms of  weakness or loss of control of the extremities. The patient does not have symptoms of headache. The patient does not have symptoms of seizure or uncontrolled movement. The patient does not have symptoms of changes in vision. The patient does not have changes in speech. The patient does not have confusion.   The patient was counseled that she will be contacted by our brain and spine navigator to schedule surveillance imaging. The patient was encouraged to call if she have not received a call to schedule imaging, or if she develops concerns or questions regarding radiation. The patient will also continue to follow up with Dr. Barbaraann Cao in medical oncology.   This concludes the interaction.  Ruel Favors, LPN

## 2023-06-09 ENCOUNTER — Ambulatory Visit (HOSPITAL_COMMUNITY)
Admission: RE | Admit: 2023-06-09 | Discharge: 2023-06-09 | Disposition: A | Discharge status: Home / Self Care | Payer: Medicare Other | Source: Ambulatory Visit | Attending: Internal Medicine | Admitting: Internal Medicine

## 2023-06-09 DIAGNOSIS — C719 Malignant neoplasm of brain, unspecified: Secondary | ICD-10-CM | POA: Diagnosis present

## 2023-06-09 MED ORDER — GADOBUTROL 1 MMOL/ML IV SOLN
5.0000 mL | Freq: Once | INTRAVENOUS | Status: AC | PRN
  Administered 2023-06-09: 5 mL via INTRAVENOUS

## 2023-06-13 ENCOUNTER — Inpatient Hospital Stay: Payer: Medicare Other | Admitting: Internal Medicine

## 2023-06-13 ENCOUNTER — Inpatient Hospital Stay: Payer: Medicare Other

## 2023-06-14 ENCOUNTER — Other Ambulatory Visit: Payer: Self-pay

## 2023-06-27 ENCOUNTER — Inpatient Hospital Stay: Payer: Medicare Other | Attending: Internal Medicine | Admitting: Internal Medicine

## 2023-06-27 ENCOUNTER — Other Ambulatory Visit: Payer: Self-pay

## 2023-06-27 ENCOUNTER — Telehealth: Payer: Self-pay | Admitting: Pharmacy Technician

## 2023-06-27 ENCOUNTER — Inpatient Hospital Stay: Payer: Medicare Other

## 2023-06-27 ENCOUNTER — Other Ambulatory Visit (HOSPITAL_COMMUNITY): Payer: Self-pay

## 2023-06-27 ENCOUNTER — Telehealth: Payer: Self-pay | Admitting: Pharmacist

## 2023-06-27 VITALS — BP 126/88 | HR 73 | Temp 98.3°F | Resp 16 | Wt 108.6 lb

## 2023-06-27 DIAGNOSIS — C719 Malignant neoplasm of brain, unspecified: Secondary | ICD-10-CM

## 2023-06-27 LAB — CBC WITH DIFFERENTIAL (CANCER CENTER ONLY)
Abs Immature Granulocytes: 0.01 10*3/uL (ref 0.00–0.07)
Basophils Absolute: 0 10*3/uL (ref 0.0–0.1)
Basophils Relative: 0 %
Eosinophils Absolute: 0 10*3/uL (ref 0.0–0.5)
Eosinophils Relative: 0 %
HCT: 44.6 % (ref 36.0–46.0)
Hemoglobin: 15.5 g/dL — ABNORMAL HIGH (ref 12.0–15.0)
Immature Granulocytes: 0 %
Lymphocytes Relative: 25 %
Lymphs Abs: 0.8 10*3/uL (ref 0.7–4.0)
MCH: 33.6 pg (ref 26.0–34.0)
MCHC: 34.8 g/dL (ref 30.0–36.0)
MCV: 96.7 fL (ref 80.0–100.0)
Monocytes Absolute: 0.4 10*3/uL (ref 0.1–1.0)
Monocytes Relative: 13 %
Neutro Abs: 2 10*3/uL (ref 1.7–7.7)
Neutrophils Relative %: 62 %
Platelet Count: 208 10*3/uL (ref 150–400)
RBC: 4.61 MIL/uL (ref 3.87–5.11)
RDW: 12.7 % (ref 11.5–15.5)
WBC Count: 3.2 10*3/uL — ABNORMAL LOW (ref 4.0–10.5)
nRBC: 0 % (ref 0.0–0.2)

## 2023-06-27 LAB — CMP (CANCER CENTER ONLY)
ALT: 12 U/L (ref 0–44)
AST: 15 U/L (ref 15–41)
Albumin: 4.6 g/dL (ref 3.5–5.0)
Alkaline Phosphatase: 67 U/L (ref 38–126)
Anion gap: 4 — ABNORMAL LOW (ref 5–15)
BUN: 6 mg/dL (ref 6–20)
CO2: 30 mmol/L (ref 22–32)
Calcium: 10 mg/dL (ref 8.9–10.3)
Chloride: 107 mmol/L (ref 98–111)
Creatinine: 0.68 mg/dL (ref 0.44–1.00)
GFR, Estimated: >60 mL/min (ref >60)
Glucose, Bld: 87 mg/dL (ref 70–99)
Potassium: 3.3 mmol/L — ABNORMAL LOW (ref 3.5–5.1)
Sodium: 141 mmol/L (ref 135–145)
Total Bilirubin: 0.5 mg/dL (ref <1.2)
Total Protein: 7.3 g/dL (ref 6.5–8.1)

## 2023-06-27 MED ORDER — TEMOZOLOMIDE 20 MG PO CAPS
20.0000 mg | ORAL_CAPSULE | Freq: Every day | ORAL | 0 refills | Status: DC
  Filled 2023-07-03: qty 5, 28d supply, fill #0

## 2023-06-27 MED ORDER — TEMOZOLOMIDE 100 MG PO CAPS
200.0000 mg | ORAL_CAPSULE | Freq: Every day | ORAL | 0 refills | Status: DC
Start: 2023-06-27 — End: 2023-06-27

## 2023-06-27 MED ORDER — TEMOZOLOMIDE 100 MG PO CAPS
200.0000 mg | ORAL_CAPSULE | Freq: Every day | ORAL | 0 refills | Status: DC
  Filled 2023-07-03: qty 10, 28d supply, fill #0

## 2023-06-27 MED ORDER — ONDANSETRON HCL 8 MG PO TABS
8.0000 mg | ORAL_TABLET | Freq: Three times a day (TID) | ORAL | 1 refills | Status: AC | PRN
  Filled 2023-06-27: qty 30, 10d supply, fill #0
  Filled 2023-08-23: qty 30, 10d supply, fill #1

## 2023-06-27 MED ORDER — TEMOZOLOMIDE 20 MG PO CAPS
20.0000 mg | ORAL_CAPSULE | Freq: Every day | ORAL | 0 refills | Status: DC
Start: 2023-06-27 — End: 2023-06-27

## 2023-06-27 NOTE — Progress Notes (Signed)
[facility] at [facility] [address]  [place], [place] [address] [phone]   Interval Evaluation  Date of Service: 06/27/23 Patient Name: Maureen Key Patient MRN: [id number] Patient DOB: June 15, 1981 Provider: Henreitta Leber, MD  Identifying Statement:  Maureen Key is a 42 y.o. 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 Maurice Small.  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  IDH 1/2 Mutated.  1p/19q co-deleted  TERT Unknown   Interval History:  Maureen Key presents for follow up today after recent MRI brain. No clinical changes today.  Migraines remain well controlled with propanolol.  Rarely needing breakthrough analegsia.  Otherwise maintaining activity level at home and work.    H+P (12/20/19) Patient presented to medical attention after trauma, fall while roller skating and fracturing tibia one week ago.  This led to trauma evaluation and CNS imaging which demonstrated left frontal mass ~5cm.  She denies any neurologic symptoms, no headaches or seizures.  Currently in cast and wheelchair following tibial fixation over the weekend, healing well.   Medications: Current Outpatient Medications on File Prior to Visit  Medication Sig Dispense Refill   acetaminophen (TYLENOL) 500 MG tablet Take 1 tablet (500 mg total) by mouth every 6 (six) hours as needed. 30 tablet 0   cetirizine (ZYRTEC) 10 MG tablet Take 1 tablet (10 mg total) by mouth daily. 10 tablet 0   metoCLOPramide (REGLAN) 10 MG tablet  Take 1 tablet (10 mg total) by mouth every 8 (eight) hours as needed (migraine headache). 30 tablet 0   metroNIDAZOLE (FLAGYL) 500 MG tablet Take 1 tablet (500 mg total) by mouth 2 (two) times daily. 14 tablet 0   ondansetron (ZOFRAN) 8 MG tablet Take 1 tablet (8 mg total) by mouth every 8 (eight) hours as needed for nausea or vomiting. May take 30-60 minutes prior to Temodar administration if nausea/vomiting occurs as needed. 30 tablet 1   propranolol (INDERAL) 40 MG tablet Take 1 tablet (40 mg total) by mouth 2 (two) times daily. 60 tablet 3   SUMAtriptan (IMITREX) 100 MG tablet Take 1 tablet (100 mg total) by mouth every 2 (two) hours as needed for migraine. May repeat in 2 hours if headache persists or recurs. 10 tablet 0   VITAMIN D PO Take by mouth.     dexamethasone (DECADRON) 1 MG tablet Take 1 tablet (1 mg total) by mouth daily. (Patient not taking: Reported on 04/11/2023) 30 tablet 0   temozolomide (TEMODAR) 100 MG capsule Take 1 capsule (100 mg total) by mouth daily. May take on an empty stomach to decrease nausea & vomiting. (Patient not taking: Reported on 06/27/2023) 42 capsule 0   No current facility-administered medications on file prior to visit.    Allergies: No Known Allergies Past Medical History:  Past Medical History:  Diagnosis Date   Anxiety 2008   Blood transfusion without reported diagnosis    last PPH delivery   History of placenta accreta in prior pregnancy, currently pregnant    History of sepsis    Oligodendroglioma (HCC)  Sickle cell trait [facility])    Past Surgical History:  Past Surgical History:  Procedure Laterality Date   ABDOMINAL HYSTERECTOMY  05/12/2017   Procedure: HYSTERECTOMY ABDOMINAL;  Surgeon: Tilda Burrow, MD;  Location: [facility] BIRTHING SUITES;  Service: Obstetrics;;   APPLICATION OF CRANIAL NAVIGATION N/A 02/04/2020   Procedure: APPLICATION OF CRANIAL NAVIGATION;  Surgeon: Jadene Pierini, MD;  Location: [facility] OR;  Service: Neurosurgery;   Laterality: N/A;  APPLICATION OF CRANIAL NAVIGATION   BRAIN SURGERY  01/05/2020   CERVICAL CONE BIOPSY     CESAREAN SECTION     CESAREAN SECTION N/A 05/12/2017   Procedure: CESAREAN SECTION WITH Hysterectomy;  Surgeon: Tilda Burrow, MD;  Location: [facility] BIRTHING SUITES;  Service: Obstetrics;  Laterality: N/A;   CRANIOTOMY Left 02/04/2020   Procedure: LEFT CRANIOTOMY FOR TUMOR RESECTION;  Surgeon: Jadene Pierini, MD;  Location: [facility] OR;  Service: Neurosurgery;  Laterality: Left;  LEFT CRANIOTOMY FOR TUMOR RESECTION   TIBIA IM NAIL INSERTION Right 12/13/2019   Procedure: INTRAMEDULLARY (IM) NAIL TIBIAL;  Surgeon: Roby Lofts, MD;  Location: [facility] OR;  Service: Orthopedics;  Laterality: Right;   Social History:  Social History   Socioeconomic History   Marital status: Single    Spouse name: Not on file   Number of children: 4   Years of education: Not on file   Highest education level: 12th grade  Occupational History   Not on file  Tobacco Use   Smoking status: Former    Current packs/day: 0.00    Average packs/day: 0.3 packs/day for 15.0 years (3.8 ttl pk-yrs)    Types: Cigarettes    Start date: 03/08/2002    Quit date: 03/08/2017    Years since quitting: 6.3   Smokeless tobacco: Never   Tobacco comments:    3 cigarettes a day  Vaping Use   Vaping status: Never Used  Substance and Sexual Activity   Alcohol use: Not Currently    Alcohol/week: 1.0 standard drink of alcohol    Types: 1 Glasses of wine per week    Comment: On occasions   Drug use: Yes    Frequency: 2.0 times per week    Types: Marijuana    Comment: last used 04/09/17   Sexual activity: Yes    Birth control/protection: Condom, None  Other Topics Concern   Not on file  Social History Narrative   Not on file   Social Drivers of Health   Financial Resource Strain: Low Risk  (01/10/2023)   Overall Financial Resource Strain (CARDIA)    Difficulty of Paying Living Expenses: Not hard at all  Food  Insecurity: No Food Insecurity (01/17/2023)   Hunger Vital Sign    Worried About Running Out of Food in the Last Year: Never true    Ran Out of Food in the Last Year: Never true  Recent Concern: Food Insecurity - Food Insecurity Present (11/28/2022)   Hunger Vital Sign    Worried About Running Out of Food in the Last Year: Sometimes true    Ran Out of Food in the Last Year: Sometimes true  Transportation Needs: No Transportation Needs (01/17/2023)   PRAPARE - Administrator, Civil Service (Medical): No    Lack of Transportation (Non-Medical): No  Physical Activity: Insufficiently Active (01/10/2023)   Exercise Vital Sign    Days of Exercise per Week: 3 days    Minutes of Exercise per Session: 30 min  Stress: No Stress Concern Present (01/10/2023)  [employer] of Occupational Health - Occupational Stress Questionnaire    Feeling of Stress : Not at all  Social Connections: Moderately Isolated (01/10/2023)   Social Connection and Isolation Panel [NHANES]    Frequency of Communication with Friends and Family: More than three times a week    Frequency of Social Gatherings with Friends and Family: Twice a week    Attends Religious Services: 1 to 4 times per year    Active Member of [employer] or Organizations: No    Attends Banker Meetings: Never    Marital Status: Never married  Intimate Partner Violence: Not At Risk (01/17/2023)   Humiliation, Afraid, Rape, and Kick questionnaire    Fear of Current or Ex-Partner: No    Emotionally Abused: No    Physically Abused: No    Sexually Abused: No   Family History:  Family History  Problem Relation Age of Onset   Hypertension Mother    HIV/AIDS Father    Cancer Father        lung   Diabetes Sister    Diabetes Maternal Aunt    Diabetes Maternal Uncle    Cancer Paternal Aunt    Cancer Paternal Uncle    Diabetes Maternal Grandmother    Alzheimer's disease Paternal Grandmother     Review of Systems: Constitutional:  Doesn't report fevers, chills or abnormal weight loss Eyes: Doesn't report blurriness of vision Ears, nose, mouth, throat, and face: Doesn't report sore throat Respiratory: Doesn't report cough, dyspnea or wheezes Cardiovascular: Doesn't report palpitation, chest discomfort  Gastrointestinal:  Doesn't report nausea, constipation, diarrhea GU: Doesn't report incontinence Skin: Doesn't report skin rashes Neurological: Per HPI Musculoskeletal: Doesn't report joint pain Behavioral/Psych: Doesn't report anxiety  Physical Exam: Vitals:   06/27/23 0949  BP: 126/88  Pulse: 73  Resp: 16  Temp: 98.3 F (36.8 C)  SpO2: 100%   KPS: 80. General: Alert, cooperative, pleasant, in no acute distress Head: Normal EENT: No conjunctival injection or scleral icterus.  Lungs: Resp effort normal Cardiac: Regular rate Abdomen: Non-distended abdomen Skin: No rashes cyanosis or petechiae. Extremities: No clubbing or edema  Neurologic Exam: Mental Status: Awake, alert, attentive to examiner. Oriented to self and environment. Language is fluent with intact comprehension.  Impaired recall, age advanced psychomotor slowing Cranial Nerves: Visual acuity is grossly normal. Visual fields are full. Extra-ocular movements intact. No ptosis. Face is symmetric Motor: Tone and bulk are normal. Power is full in both arms and legs. Reflexes are symmetric, no pathologic reflexes present.  Sensory: Intact to light touch Gait: Orthopedic limitation only  Labs: I have reviewed the data as listed    Component Value Date/Time   NA 141 06/27/2023 0930   NA 143 01/03/2018 0949   K 3.3 (L) 06/27/2023 0930   CL 107 06/27/2023 0930   CO2 30 06/27/2023 0930   GLUCOSE 87 06/27/2023 0930   BUN 6 06/27/2023 0930   BUN 14 01/03/2018 0949   CREATININE 0.68 06/27/2023 0930   CALCIUM 10.0 06/27/2023 0930   PROT 7.3 06/27/2023 0930   PROT 7.6 01/03/2018 0949   ALBUMIN 4.6 06/27/2023 0930   ALBUMIN 4.8 01/03/2018 0949    AST 15 06/27/2023 0930   ALT 12 06/27/2023 0930   ALKPHOS 67 06/27/2023 0930   BILITOT 0.5 06/27/2023 0930   GFRNONAA >60 06/27/2023 0930   GFRAA >60 01/31/2020 1340   Lab Results  Component Value Date   WBC 3.2 (L) 06/27/2023   NEUTROABS 2.0 06/27/2023  HGB 15.5 (H) 06/27/2023   HCT 44.6 06/27/2023   MCV 96.7 06/27/2023   PLT 208 06/27/2023    Imaging:  [facility] Clinician Interpretation: I have personally reviewed the CNS images as listed.  My interpretation, in the context of the patient's clinical presentation, is stable disease   MR BRAIN W WO CONTRAST Result Date: 06/09/2023 CLINICAL DATA:  Brain/CNS neoplasm. Assess interval response. Oligodendroglioma IDH gene mutant. EXAM: MRI HEAD WITHOUT AND WITH CONTRAST TECHNIQUE: Multiplanar, multiecho pulse sequences of the brain and surrounding structures were obtained without and with intravenous contrast. CONTRAST:  5mL GADAVIST GADOBUTROL 1 MMOL/ML IV SOLN COMPARISON:  MR head without and with contrast 04/09/2023 and 12/29/2022. FINDINGS: Brain: Postoperative changes are again noted in the anterior left frontal lobe. Enhancement along the dura and the margin of the surgical cavity is stable. Surrounding T2 and FLAIR signal hyperintensity is again noted. T2 signal is slightly increased compared to the prior exam, best appreciated on coronal image 20 of series 15. The changes less profound than on the prior exams. No distal enhancement or T2 signal change is present. No acute infarct or hemorrhage is present. Deep brain nuclei are within normal limits. The ventricles are of normal size. No significant extraaxial fluid collection is present. The brainstem and cerebellum are within normal limits. The internal auditory canals are within normal limits. Midline structures are within normal limits. No other pathologic enhancement is present. Vascular: Flow is present in the major intracranial arteries. Skull and upper cervical spine: The craniocervical  junction is normal. Upper cervical spine is within normal limits. Marrow signal is unremarkable. Sinuses/Orbits: The paranasal sinuses and mastoid air cells are clear. The globes and orbits are within normal limits. IMPRESSION: 1. Slightly increased T2 signal surrounding the surgical cavity. This is less profound than on the prior exams in remains concerning for residual tumor. 2. Normal MRI appearance of the brain separate from the resection site. Electronically Signed   By: Marin Roberts M.D.   On: 06/09/2023 12:47     Assessment/Plan No diagnosis found.  Maureen Key is clinically stable today, now having completed full 6 weeks of IMRT and Temodar.  MRI brain again demonstrates stable findings, with possible subtle increase in T2 signal adjacent to resection cavity.   We recommended initiating treatment with Temozolomide 150 mg/m2, on for five days and off for twenty three days in twenty eight day cycles. The patient will have a complete blood count performed on days 21 and 28 of each cycle, and a comprehensive metabolic panel performed on day 28 of each cycle. Labs may need to be performed more often. Zofran will prescribed for home use for nausea/vomiting.   Chemotherapy should be held for the following:  ANC less than 1,000  Platelets less than 100,000  LFT or creatinine greater than 2x ULN  If clinical concerns/contraindications develop  Cycle #1 will be dosed on 07/17/23 per patient preference.  Recommended continuing propanolol 40mg  BID for headache prevention.    We ask that Maureen Key return to clinic in 1 month with labs prior to cycle #2, or sooner as needed.  All questions were answered. The patient knows to call the clinic with any problems, questions or concerns. No barriers to learning were detected.  I have spent a total of 40 minutes of face-to-face and non-face-to-face time, excluding clinical staff time, preparing to see patient, ordering  tests and/or medications, counseling the patient, and independently interpreting results and communicating results to the patient/family/caregiver  Henreitta Leber, MD Medical Director of Neuro-Oncology [facility] 06/27/23 10:06 AM

## 2023-06-27 NOTE — Telephone Encounter (Signed)
Oral Oncology Pharmacist Encounter  Received new prescription for Temodar (temozolomide) for the treatment of oligodendroglioma WHO III, planned duration 6-12 cycles. Planned start date for cycle one is 07/17/23 per patient preference.   CBC w/ Diff and CMP from 06/27/23 assessed, noted WBC 3.2 K/uL, but ANC 2000. Prescription dose and frequency assessed for appropriateness.  Current medication list in Epic reviewed, no relevant/significant DDIs with Temodar identified.  Evaluated chart and no patient barriers to medication adherence noted.   Patient agreement for treatment documented in MD note on 06/27/23.  Prescription has been e-scribed to the [facility] for benefits analysis and approval.  Oral Oncology Clinic will continue to follow for insurance authorization, copayment issues, initial counseling and start date.  Sherry Ruffing, PharmD, BCPS, BCOP Hematology/Oncology Clinical Pharmacist [facility] Oral Chemotherapy Navigation Clinics [phone] 06/27/2023 12:34 PM

## 2023-06-27 NOTE — Telephone Encounter (Signed)
Oral Oncology Patient Advocate Encounter  After completing a benefits investigation, prior authorization for temozolomide is not required at this time through Medicare B.  Patient's copay is $0.     Jinger Neighbors, CPhT-Adv Oncology Pharmacy Patient Advocate [facility] Direct Number: [phone]  Fax: [phone]

## 2023-06-29 ENCOUNTER — Other Ambulatory Visit: Payer: Self-pay

## 2023-07-03 ENCOUNTER — Other Ambulatory Visit: Payer: Self-pay

## 2023-07-03 ENCOUNTER — Other Ambulatory Visit: Payer: Self-pay | Admitting: Pharmacy Technician

## 2023-07-03 NOTE — Telephone Encounter (Signed)
Oral Chemotherapy Pharmacist Encounter  I spoke with patient for overview of: Temodar (temozolomide) for the treatment of oligodendroglioma WHO III, planned duration 6-12 cycles. Planned start date for cycle one is 07/17/23 per patient preference.   Counseled on administration, dosing, side effects, monitoring, drug-food interactions, safe handling, storage, and disposal.  Patient will take Temodar 100mg  capsules and Temodar 20mg  capsules, 220mg  total daily dose, by mouth once daily, may take at bedtime and on an empty stomach to decrease nausea and vomiting.  If 1st cycle is well tolerated, patient informed that Temodar dose may be increased to 200 mg/m2 daily for 5 days on, 23 days off, repeated every 28 days for subsequent cycles   Patient will take Temodar daily for 5 days on, 23 days off, and repeated.  Temodar start date: 07/17/23   Adverse effects include but are not limited to: nausea, vomiting, GI upset, rash, and fatigue. N/V PPX: Patient will take Zofran 8mg  tablet, 1 tablet by mouth 30-60 min prior to Temodar dose to help decrease N/V. We discussed strategies to manage constipation if they occur secondary to ondansetron dosing.  Reviewed importance of keeping a medication schedule and plan for any missed doses. No barriers to medication adherence identified.  Medication reconciliation performed and medication/allergy list updated.  All questions answered.  Maureen Key voiced understanding and appreciation.   Medication education handout placed in mail for patient. Patient knows to call the office with questions or concerns. Oral Chemotherapy Clinic phone number provided to patient.   Sherry Ruffing, PharmD, BCPS, BCOP Hematology/Oncology Clinical Pharmacist [facility] Oral Chemotherapy Navigation Clinics [phone] 07/03/2023 11:15 AM

## 2023-07-03 NOTE — Progress Notes (Signed)
Specialty Pharmacy Initial Fill Coordination Note  Maureen Key is a 42 y.o. female contacted today regarding refills of specialty medication(s) Temozolomide (TEMODAR) .  Patient requested Delivery  on 07/10/23  to verified address [address]  [name] [address]-   Medication will be filled on 07/06/23.   Patient is aware of $0 copayment.

## 2023-07-03 NOTE — Progress Notes (Signed)
Oral Chemotherapy Pharmacist Encounter  Patient was counseled under telephone encounter from 06/27/23.  Sherry Ruffing, PharmD, BCPS, BCOP Hematology/Oncology Clinical Pharmacist [facility] Oral Chemotherapy Navigation Clinics [phone] 07/03/2023 11:18 AM

## 2023-07-04 ENCOUNTER — Other Ambulatory Visit (HOSPITAL_COMMUNITY): Payer: Self-pay

## 2023-07-04 ENCOUNTER — Other Ambulatory Visit: Payer: Self-pay

## 2023-07-06 ENCOUNTER — Other Ambulatory Visit: Payer: Self-pay

## 2023-07-07 ENCOUNTER — Encounter: Payer: Self-pay | Admitting: Internal Medicine

## 2023-07-07 ENCOUNTER — Other Ambulatory Visit: Payer: Self-pay

## 2023-07-14 ENCOUNTER — Ambulatory Visit (INDEPENDENT_AMBULATORY_CARE_PROVIDER_SITE_OTHER): Payer: Medicare Other

## 2023-07-14 DIAGNOSIS — Z0000 Encounter for general adult medical examination without abnormal findings: Secondary | ICD-10-CM | POA: Diagnosis not present

## 2023-07-14 NOTE — Patient Instructions (Signed)
 Maureen Key , Thank you for taking time to come for your Medicare Wellness Visit. I appreciate your ongoing commitment to your health goals. Please review the following plan we discussed and let me know if I can assist you in the future.   Referrals/Orders/Follow-Ups/Clinician Recommendations: assistance for electric bill  This is a list of the screening recommended for you and due dates:  Health Maintenance  Topic Date Due   COVID-19 Vaccine (1) Never done   Hepatitis C Screening  Never done   Flu Shot  10/09/2023*   Mammogram  11/08/2023   Medicare Annual Wellness Visit  07/13/2024   DTaP/Tdap/Td vaccine (2 - Td or Tdap) 05/16/2027   Pap with HPV screening  01/10/2028   HIV Screening  Completed   HPV Vaccine  Aged Out  *Topic was postponed. The date shown is not the original due date.    Advanced directives: (ACP Link)Information on Advanced Care Planning can be found at [place]  Secretary of [facility] Advance Health Care Directives Advance Health Care Directives ([email or web address]   Next Medicare Annual Wellness Visit scheduled for next year: Yes  insert Preventive Care Attachment Reference

## 2023-07-14 NOTE — Addendum Note (Signed)
 Addended by: Barb Merino on: 07/14/2023 01:34 PM   Modules accepted: Orders

## 2023-07-14 NOTE — Progress Notes (Signed)
 Subjective:   Maureen Key is a 43 y.o. female who presents for an Initial Medicare Annual Wellness Visit.  Visit Complete: Virtual I connected with  Maureen Key on 07/14/23 by a audio enabled telemedicine application and verified that I am speaking with the correct person using two identifiers.  This patient declined Interactive audio and acupuncturist. Therefore the visit was completed with audio only.   Patient Location: Home  Provider Location: Home Office  I discussed the limitations of evaluation and management by telemedicine. The patient expressed understanding and agreed to proceed.  Vital Signs: Because this visit was a virtual/telehealth visit, some criteria may be missing or patient reported. Any vitals not documented were not able to be obtained and vitals that have been documented are patient reported.  Patient Medicare AWV questionnaire was completed by the patient on 07/10/2023; I have confirmed that all information answered by patient is correct and no changes since this date.  Cardiac Risk Factors include: none     Objective:    Today's Vitals   There is no height or weight on file to calculate BMI.     07/14/2023    1:16 PM 03/14/2023    9:40 AM 04/28/2022    8:08 AM 05/16/2021   11:05 AM 01/12/2021   12:01 PM 05/19/2020   10:20 AM 02/04/2020    4:00 PM  Advanced Directives  Does Patient Have a Medical Advance Directive? No No No No No No No  Would patient like information on creating a medical advance directive?  No - Patient declined  No - Patient declined No - Patient declined No - Patient declined No - Patient declined    Current Medications (verified) Outpatient Encounter Medications as of 07/14/2023  Medication Sig   acetaminophen  (TYLENOL ) 500 MG tablet Take 1 tablet (500 mg total) by mouth every 6 (six) hours as needed.   cetirizine  (ZYRTEC ) 10 MG tablet Take 1 tablet (10 mg total) by mouth daily.   ondansetron  (ZOFRAN ) 8  MG tablet Take 1 tablet (8 mg total) by mouth every 8 (eight) hours as needed for nausea or vomiting. May take 30-60 minutes prior to Temodar  administration if nausea/vomiting occurs as needed.   propranolol  (INDERAL ) 40 MG tablet Take 1 tablet (40 mg total) by mouth 2 (two) times daily.   SUMAtriptan  (IMITREX ) 100 MG tablet Take 1 tablet (100 mg total) by mouth every 2 (two) hours as needed for migraine. May repeat in 2 hours if headache persists or recurs.   VITAMIN D  PO Take by mouth.   dexamethasone  (DECADRON ) 1 MG tablet Take 1 tablet (1 mg total) by mouth daily. (Patient not taking: Reported on 07/14/2023)   metoCLOPramide  (REGLAN ) 10 MG tablet Take 1 tablet (10 mg total) by mouth every 8 (eight) hours as needed (migraine headache). (Patient not taking: Reported on 07/14/2023)   metroNIDAZOLE  (FLAGYL ) 500 MG tablet Take 1 tablet (500 mg total) by mouth 2 (two) times daily. (Patient not taking: Reported on 07/14/2023)   temozolomide  (TEMODAR ) 100 MG capsule Take 2 capsules (200 mg total) by mouth daily. (Take with one 20 mg capsule for total daily dose of 220 mg). Take for 5 days on, then off for 23 days. Repeat every 28 days. May take on an empty stomach to decrease nausea & vomiting. (Patient not taking: Reported on 07/14/2023)   temozolomide  (TEMODAR ) 20 MG capsule Take 1 capsule (20 mg total) by mouth daily. (Take with two 100 mg capsules for total daily  dose of 220 mg). Take for 5 days on, then off for 23 days. Repeat every 28 days. May take on an empty stomach to decrease nausea & vomiting. (Patient not taking: Reported on 07/14/2023)   No facility-administered encounter medications on file as of 07/14/2023.    Allergies (verified) Patient has no known allergies.   History: Past Medical History:  Diagnosis Date   Anxiety 2008   Blood transfusion without reported diagnosis    last PPH delivery   History of placenta accreta in prior pregnancy, currently pregnant    History of sepsis     Oligodendroglioma (HCC)    Sickle cell trait (HCC)    Past Surgical History:  Procedure Laterality Date   ABDOMINAL HYSTERECTOMY  05/12/2017   Procedure: HYSTERECTOMY ABDOMINAL;  Surgeon: Edsel Norleen GAILS, MD;  Location: [facility] BIRTHING SUITES;  Service: Obstetrics;;   APPLICATION OF CRANIAL NAVIGATION N/A 02/04/2020   Procedure: APPLICATION OF CRANIAL NAVIGATION;  Surgeon: Cheryle Debby LABOR, MD;  Location: [facility] OR;  Service: Neurosurgery;  Laterality: N/A;  APPLICATION OF CRANIAL NAVIGATION   BRAIN SURGERY  01/05/2020   CERVICAL CONE BIOPSY     CESAREAN SECTION     CESAREAN SECTION N/A 05/12/2017   Procedure: CESAREAN SECTION WITH Hysterectomy;  Surgeon: Edsel Norleen GAILS, MD;  Location: [facility] BIRTHING SUITES;  Service: Obstetrics;  Laterality: N/A;   CRANIOTOMY Left 02/04/2020   Procedure: LEFT CRANIOTOMY FOR TUMOR RESECTION;  Surgeon: Cheryle Debby LABOR, MD;  Location: [facility] OR;  Service: Neurosurgery;  Laterality: Left;  LEFT CRANIOTOMY FOR TUMOR RESECTION   TIBIA IM NAIL INSERTION Right 12/13/2019   Procedure: INTRAMEDULLARY (IM) NAIL TIBIAL;  Surgeon: Kendal Franky SQUIBB, MD;  Location: [facility] OR;  Service: Orthopedics;  Laterality: Right;   Family History  Problem Relation Age of Onset   Hypertension Mother    HIV/AIDS Father    Cancer Father        lung   Diabetes Sister    Diabetes Maternal Aunt    Diabetes Maternal Uncle    Cancer Paternal Aunt    Cancer Paternal Uncle    Diabetes Maternal Grandmother    Alzheimer's disease Paternal Grandmother    Social History   Socioeconomic History   Marital status: Single    Spouse name: Not on file   Number of children: 4   Years of education: Not on file   Highest education level: 12th grade  Occupational History   Not on file  Tobacco Use   Smoking status: Former    Current packs/day: 0.00    Average packs/day: 0.3 packs/day for 15.0 years (3.8 ttl pk-yrs)    Types: Cigarettes    Start date: 03/08/2002    Quit date: 03/08/2017    Years  since quitting: 6.3   Smokeless tobacco: Never   Tobacco comments:    3 cigarettes a day  Vaping Use   Vaping status: Never Used  Substance and Sexual Activity   Alcohol use: Not Currently    Alcohol/week: 1.0 standard drink of alcohol    Types: 1 Glasses of wine per week    Comment: On occasions   Drug use: Not Currently    Frequency: 2.0 times per week    Types: Marijuana    Comment: last used 04/09/17   Sexual activity: Yes    Birth control/protection: Condom, None  Other Topics Concern   Not on file  Social History Narrative   Not on file   Social Drivers of Health   Financial  Resource Strain: Low Risk  (07/14/2023)   Overall Financial Resource Strain (CARDIA)    Difficulty of Paying Living Expenses: Not hard at all  Food Insecurity: No Food Insecurity (07/14/2023)   Hunger Vital Sign    Worried About Running Out of Food in the Last Year: Never true    Ran Out of Food in the Last Year: Never true  Transportation Needs: No Transportation Needs (01/17/2023)   PRAPARE - Administrator, Civil Service (Medical): No    Lack of Transportation (Non-Medical): No  Physical Activity: Inactive (07/14/2023)   Exercise Vital Sign    Days of Exercise per Week: 0 days    Minutes of Exercise per Session: 0 min  Stress: No Stress Concern Present (07/14/2023)   [employer] of Occupational Health - Occupational Stress Questionnaire    Feeling of Stress : Not at all  Social Connections: Moderately Isolated (07/14/2023)   Social Connection and Isolation Panel [NHANES]    Frequency of Communication with Friends and Family: More than three times a week    Frequency of Social Gatherings with Friends and Family: Once a week    Attends Religious Services: More than 4 times per year    Active Member of [employer] or Organizations: No    Attends Engineer, Structural: Never    Marital Status: Never married    Tobacco Counseling Counseling given: Not Answered Tobacco comments: 3  cigarettes a day   Clinical Intake:  Pre-visit preparation completed: Yes  Pain : No/denies pain     Nutritional Risks: None Diabetes: No  How often do you need to have someone help you when you read instructions, pamphlets, or other written materials from your doctor or pharmacy?: 1 - Never  Interpreter Needed?: No  Information entered by :: NAllen LPN   Activities of Daily Living    07/10/2023    8:39 AM  In your present state of health, do you have any difficulty performing the following activities:  Hearing? 0  Vision? 0  Difficulty concentrating or making decisions? 0  Walking or climbing stairs? 0  Dressing or bathing? 0  Doing errands, shopping? 0  Preparing Food and eating ? N  Using the Toilet? N  In the past six months, have you accidently leaked urine? Y  Do you have problems with loss of bowel control? N  Managing your Medications? N  Managing your Finances? N  Housekeeping or managing your Housekeeping? N    Patient Care Team: Lendia Boby CROME, NP-C as PCP - General (Family Medicine) Buckley Zachary K, MD as Consulting Physician (Psychiatry)  Indicate any recent Medical Services you may have received from other than [facility] providers in the past year (date may be approximate).     Assessment:   This is a routine wellness examination for Maureen Key.  Hearing/Vision screen Hearing Screening - Comments:: Denies hearing issues Vision Screening - Comments:: No regular eye exams, [facility]   Goals Addressed             This Visit's Progress    Patient Stated       07/14/2023, start exercising regularly       Depression Screen    07/14/2023    1:17 PM 03/07/2023    8:43 AM 01/17/2023    2:01 PM 01/10/2023    3:32 PM 11/03/2022    1:29 PM 11/11/2021    9:27 AM 01/03/2018    9:09 AM  PHQ 2/9 Scores  PHQ - 2  Score 0 5 2 1 2  0 0  PHQ- 9 Score  11  3 8 2      Fall Risk    07/10/2023    8:39 AM 03/07/2023    8:43 AM 11/03/2022    1:29 PM 11/11/2021     9:28 AM  Fall Risk   Falls in the past year? 0 0 0 0  Number falls in past yr: 0 0 0 0  Injury with Fall? 0 0 0 0  Risk for fall due to : Medication side effect No Fall Risks No Fall Risks No Fall Risks  Follow up Falls prevention discussed;Falls evaluation completed Falls evaluation completed Falls evaluation completed Falls evaluation completed    MEDICARE RISK AT HOME: Medicare Risk at Home Any stairs in or around the home?: (Patient-Rptd) Yes If so, are there any without handrails?: (Patient-Rptd) No Home free of loose throw rugs in walkways, pet beds, electrical cords, etc?: (Patient-Rptd) Yes Adequate lighting in your home to reduce risk of falls?: (Patient-Rptd) Yes Life alert?: (Patient-Rptd) No Use of a cane, walker or w/c?: (Patient-Rptd) No Grab bars in the bathroom?: (Patient-Rptd) No Shower chair or bench in shower?: (Patient-Rptd) Yes Elevated toilet seat or a handicapped toilet?: (Patient-Rptd) No  TIMED UP AND GO:  Was the test performed? No    Cognitive Function:        07/14/2023    1:17 PM  6CIT Screen  What Year? 0 points  What month? 0 points  What time? 0 points  Count back from 20 0 points  Months in reverse 0 points  Repeat phrase 0 points  Total Score 0 points    Immunizations Immunization History  Administered Date(s) Administered   Influenza,inj,Quad PF,6+ Mos 03/23/2017   Influenza-Unspecified 03/23/2017, 03/28/2018, 05/11/2021   Tdap 05/15/2017    TDAP status: Up to date  Flu Vaccine status: Declined, Education has been provided regarding the importance of this vaccine but patient still declined. Advised may receive this vaccine at local pharmacy or Health Dept. Aware to provide a copy of the vaccination record if obtained from local pharmacy or Health Dept. Verbalized acceptance and understanding.  Pneumococcal vaccine status: Up to date  Covid-19 vaccine status: Information provided on how to obtain vaccines.   Qualifies for  Shingles Vaccine? No   Zostavax completed  n/a   Shingrix Completed?: n/a  Screening Tests Health Maintenance  Topic Date Due   COVID-19 Vaccine (1) Never done   Hepatitis C Screening  Never done   INFLUENZA VACCINE  02/09/2023   MAMMOGRAM  11/08/2023   Medicare Annual Wellness (AWV)  07/13/2024   DTaP/Tdap/Td (2 - Td or Tdap) 05/16/2027   Cervical Cancer Screening (HPV/Pap Cotest)  01/10/2028   HIV Screening  Completed   HPV VACCINES  Aged Out    Health Maintenance  Health Maintenance Due  Topic Date Due   COVID-19 Vaccine (1) Never done   Hepatitis C Screening  Never done   INFLUENZA VACCINE  02/09/2023    Colorectal cancer screening: n/a  Mammogram status: Completed 11/08/2022. Repeat every year  Bone Density status: n/a  Lung Cancer Screening: (Low Dose CT Chest recommended if Age 65-80 years, 20 pack-year currently smoking OR have quit w/in 15years.) does not qualify.   Lung Cancer Screening Referral: no  Additional Screening:  Hepatitis C Screening: does qualify;  Vision Screening: Recommended annual ophthalmology exams for early detection of glaucoma and other disorders of the eye. Is the patient up to date with their  annual eye exam?  Yes  Who is the provider or what is the name of the office in which the patient attends annual eye exams? [facility] If pt is not established with a provider, would they like to be referred to a provider to establish care? No .   Dental Screening: Recommended annual dental exams for proper oral hygiene  Diabetic Foot Exam: n/a  Community Resource Referral / Chronic Care Management: CRR required this visit?  Yes   CCM required this visit?  No     Plan:     I have personally reviewed and noted the following in the patient's chart:   Medical and social history Use of alcohol, tobacco or illicit drugs  Current medications and supplements including opioid prescriptions. Patient is not currently taking opioid  prescriptions. Functional ability and status Nutritional status Physical activity Advanced directives List of other physicians Hospitalizations, surgeries, and ER visits in previous 12 months Vitals Screenings to include cognitive, depression, and falls Referrals and appointments  In addition, I have reviewed and discussed with patient certain preventive protocols, quality metrics, and best practice recommendations. A written personalized care plan for preventive services as well as general preventive health recommendations were provided to patient.     Ardella FORBES Dawn, LPN   02/13/7973   After Visit Summary: (MyChart) Due to this being a telephonic visit, the after visit summary with patients personalized plan was offered to patient via MyChart   Nurse Notes: none

## 2023-07-16 ENCOUNTER — Other Ambulatory Visit: Payer: Self-pay

## 2023-07-17 ENCOUNTER — Telehealth: Payer: Self-pay | Admitting: *Deleted

## 2023-07-17 ENCOUNTER — Other Ambulatory Visit (HOSPITAL_BASED_OUTPATIENT_CLINIC_OR_DEPARTMENT_OTHER): Payer: Self-pay

## 2023-07-17 ENCOUNTER — Other Ambulatory Visit (HOSPITAL_COMMUNITY): Payer: Self-pay

## 2023-07-17 NOTE — Progress Notes (Signed)
 Complex Care Management Note  Care Guide Note 07/17/2023 Name: KELISHA DALL MRN: [id number] DOB: 01/19/1981  Jolane A Rill is a 43 y.o. year old female who sees [place], [place] L, NP-C for primary care. I reached out to Emmalin A Boline by phone today to offer complex care management services.  Ms. Birkel was given information about Complex Care Management services today including:   The Complex Care Management services include support from the care team which includes your Nurse Coordinator, Clinical Social Worker, or Pharmacist.  The Complex Care Management team is here to help remove barriers to the health concerns and goals most important to you. Complex Care Management services are voluntary, and the patient may decline or stop services at any time by request to their care team member.   Complex Care Management Consent Status: Patient agreed to services and verbal consent obtained.   Follow up plan:  Telephone appointment with complex care management team member scheduled for:  07/27/2023  Encounter Outcome:  Patient Scheduled  Thedford Franks, [facility] Care Coordination Care Guide Direct Dial: [phone]

## 2023-07-18 ENCOUNTER — Other Ambulatory Visit (HOSPITAL_COMMUNITY): Payer: Self-pay

## 2023-07-19 ENCOUNTER — Other Ambulatory Visit: Payer: Self-pay

## 2023-07-20 ENCOUNTER — Ambulatory Visit: Payer: Self-pay

## 2023-07-20 NOTE — Patient Instructions (Signed)
 Visit Information  Thank you for taking time to visit with me today. Please don't hesitate to contact me if I can be of assistance to you.   Following are the goals we discussed today:  Patient will contact [employer] to arrange payment plan. Patient will contact DSS to apply for CIP/LIEAP programs for balance of [employer] bill. Patient will disconnect cable. Patient will return call and discuss free, paid car/gas/insurance with uncle.   Our next appointment is by telephone on 07/28/23 at 11am  Please call the care guide team at [phone] if you need to cancel or reschedule your appointment.   If you are experiencing a Mental Health or Behavioral Health Crisis or need someone to talk to, please call 911  Patient verbalizes understanding of instructions and care plan provided today and agrees to view in MyChart. Active MyChart status and patient understanding of how to access instructions and care plan via MyChart confirmed with patient.     Telephone follow up appointment with care management team member scheduled for:07/28/23 at 11am.   Tillman Gardener, BSW Social Worker [phone]

## 2023-07-20 NOTE — Patient Outreach (Signed)
  Care Coordination   Initial Visit Note   07/20/2023 Name: Maureen Key MRN: [id number] DOB: Oct 02, 1980  Maureen Key is a 43 y.o. year old female who sees [place], [place] L, NP-C for primary care. I spoke with  Maureen Key by phone today.  What matters to the patients health and wellness today?  Patient receives SSI/Foodstamps with 4 children. Her bills exceed the monthly income. SW provides budgeting and patient will reduce bills in February.  Once bills are reduced patient will be able to afford basic bills. Patient has $100 toward $907 power bill.    Goals Addressed             This Visit's Progress    Finanical Management       Interventions Today    Flowsheet Row Most Recent Value  Chronic Disease   Chronic disease during today's visit Other  [Cancer]  General Interventions   General Interventions Discussed/Reviewed General Interventions Discussed, General Interventions Reviewed, [employer] bills are behind. [id number] due 02/03 for lights.SW provides budgeting. Pt has a plan to reduce bills.Referred to DSS-CIP/LIEAP & [employer] for payment plan.]              SDOH assessments and interventions completed:  Yes  SDOH Interventions Today    Flowsheet Row Most Recent Value  SDOH Interventions   Food Insecurity Interventions Intervention Not Indicated, Other (Comment)  [Gets foodstamps]  Housing Interventions Intervention Not Indicated  Transportation Interventions Intervention Not Indicated, Other (Comment)  [Has car but plans to return to reduce bills and uncle to provide car]  Utilities Interventions Intervention Not Indicated, Community Resources Provided  [employer] contact DSS to apply, budget adjusted to manage bills]        Care Coordination Interventions:  Yes, provided   Follow up plan: Follow up call scheduled for 07/28/23 at 11am.    Encounter Outcome:  Patient Visit Completed

## 2023-07-24 ENCOUNTER — Other Ambulatory Visit: Payer: Self-pay | Admitting: Adult Health

## 2023-07-24 MED ORDER — METRONIDAZOLE 500 MG PO TABS: 500.0000 mg | ORAL_TABLET | Freq: Two times a day (BID) | ORAL | 0 refills | Status: DC

## 2023-07-24 NOTE — Telephone Encounter (Signed)
 Rx sent for  flagyl, no sex or alcohol while taking

## 2023-07-28 ENCOUNTER — Encounter: Payer: Self-pay | Admitting: Internal Medicine

## 2023-07-31 ENCOUNTER — Ambulatory Visit: Payer: Self-pay

## 2023-07-31 NOTE — Patient Instructions (Signed)
Visit Information  Thank you for taking time to visit with me today. Please don't hesitate to contact me if I can be of assistance to you.   Following are the goals we discussed today:  Patient will work with family to assist with vehicle.    If you are experiencing a Mental Health or Behavioral Health Crisis or need someone to talk to, please call 911  Patient verbalizes understanding of instructions and care plan provided today and agrees to view in MyChart. Active MyChart status and patient understanding of how to access instructions and care plan via MyChart confirmed with patient.     No further follow up required: Patient does not request a follow up visit.   Lysle Morales, BSW [place]  [facility], [facility] Social Worker Direct Dial: [phone]  Fax: [phone] Website: [email or web address]

## 2023-07-31 NOTE — Patient Outreach (Signed)
  Care Coordination   Follow Up Visit Note   07/31/2023 Name: Maureen Key MRN: [id number] DOB: 1981/03/19  Maureen Key is a 43 y.o. year old female who sees [place], [place] L, NP-C for primary care. I spoke with  Essie Christine by phone today.  What matters to the patients health and wellness today?  Patient has been able to resolve financial crisis and feels she is no longer in crisis.    Goals Addressed             This Visit's Progress    Finanical Management       Interventions Today    Flowsheet Row Most Recent Value  General Interventions   General Interventions Discussed/Reviewed General Interventions Discussed, General Interventions Reviewed  [Pt was assisted w/$600 toward power bill.[employer] payment plan is [phone] which pt reports is affordable.DSS approved for LIEAP.Cable bill is disconnected.Uncle paid car payment and is working on fixing the car he will give pt.]              SDOH assessments and interventions completed:  No     Care Coordination Interventions:  Yes, provided   Follow up plan: No further intervention required.   Encounter Outcome:  Patient Visit Completed

## 2023-08-08 ENCOUNTER — Other Ambulatory Visit (HOSPITAL_COMMUNITY): Payer: Self-pay

## 2023-08-10 ENCOUNTER — Other Ambulatory Visit: Payer: Self-pay

## 2023-08-12 ENCOUNTER — Encounter: Payer: Self-pay | Admitting: Internal Medicine

## 2023-08-14 ENCOUNTER — Inpatient Hospital Stay: Payer: Medicare Other

## 2023-08-14 ENCOUNTER — Inpatient Hospital Stay: Payer: Medicare Other | Attending: Internal Medicine | Admitting: Internal Medicine

## 2023-08-14 VITALS — BP 131/85 | HR 67 | Temp 98.0°F | Resp 13 | Wt 107.7 lb

## 2023-08-14 DIAGNOSIS — C719 Malignant neoplasm of brain, unspecified: Secondary | ICD-10-CM

## 2023-08-14 DIAGNOSIS — Z79899 Other long term (current) drug therapy: Secondary | ICD-10-CM | POA: Diagnosis not present

## 2023-08-14 DIAGNOSIS — C711 Malignant neoplasm of frontal lobe: Secondary | ICD-10-CM | POA: Insufficient documentation

## 2023-08-14 LAB — CMP (CANCER CENTER ONLY)
ALT: 13 U/L (ref 0–44)
AST: 16 U/L (ref 15–41)
Albumin: 4.3 g/dL (ref 3.5–5.0)
Alkaline Phosphatase: 69 U/L (ref 38–126)
Anion gap: 6 (ref 5–15)
BUN: 11 mg/dL (ref 6–20)
CO2: 30 mmol/L (ref 22–32)
Calcium: 9.5 mg/dL (ref 8.9–10.3)
Chloride: 104 mmol/L (ref 98–111)
Creatinine: 0.74 mg/dL (ref 0.44–1.00)
GFR, Estimated: >60 mL/min (ref >60)
Glucose, Bld: 85 mg/dL (ref 70–99)
Potassium: 3.6 mmol/L (ref 3.5–5.1)
Sodium: 140 mmol/L (ref 135–145)
Total Bilirubin: 0.5 mg/dL (ref 0.0–1.2)
Total Protein: 7 g/dL (ref 6.5–8.1)

## 2023-08-14 LAB — CBC WITH DIFFERENTIAL (CANCER CENTER ONLY)
Abs Immature Granulocytes: 0 10*3/uL (ref 0.00–0.07)
Basophils Absolute: 0 10*3/uL (ref 0.0–0.1)
Basophils Relative: 0 %
Eosinophils Absolute: 0.1 10*3/uL (ref 0.0–0.5)
Eosinophils Relative: 2 %
HCT: 39.7 % (ref 36.0–46.0)
Hemoglobin: 13.9 g/dL (ref 12.0–15.0)
Immature Granulocytes: 0 %
Lymphocytes Relative: 26 %
Lymphs Abs: 0.8 10*3/uL (ref 0.7–4.0)
MCH: 33.2 pg (ref 26.0–34.0)
MCHC: 35 g/dL (ref 30.0–36.0)
MCV: 94.7 fL (ref 80.0–100.0)
Monocytes Absolute: 0.4 10*3/uL (ref 0.1–1.0)
Monocytes Relative: 11 %
Neutro Abs: 1.9 10*3/uL (ref 1.7–7.7)
Neutrophils Relative %: 61 %
Platelet Count: 157 10*3/uL (ref 150–400)
RBC: 4.19 MIL/uL (ref 3.87–5.11)
RDW: 11.9 % (ref 11.5–15.5)
WBC Count: 3.2 10*3/uL — ABNORMAL LOW (ref 4.0–10.5)
nRBC: 0 % (ref 0.0–0.2)

## 2023-08-14 NOTE — Progress Notes (Signed)
[facility] at [facility] [address]  [place], [place] [address] [phone]   Interval Evaluation  Date of Service: 08/14/23 Patient Name: Maureen Key Patient MRN: [id number] Patient DOB: [birth year] Provider: Henreitta Leber, MD  Identifying Statement:  Maureen Key is a 43 y.o. 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 Maurice Small.  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  IDH 1/2 Mutated.  1p/19q co-deleted  TERT Unknown   Interval History:  Maureen Key presents for follow up today after completing cycle #1 5-day TMZ.  Tolerated treatment well overall, just mild fatigue.  Migraines remain well controlled with propanolol.  Rarely needing breakthrough analegsia.  Otherwise maintaining activity level at home and work.    H+P (12/20/19) Patient presented to medical attention after trauma, fall while roller skating and fracturing tibia one week ago.  This led to trauma evaluation and CNS imaging which demonstrated left frontal mass ~5cm.  She denies any neurologic symptoms, no headaches or seizures.  Currently in cast and wheelchair following tibial fixation over the weekend, healing well.   Medications: Current Outpatient Medications on File Prior to Visit  Medication Sig Dispense Refill   acetaminophen (TYLENOL) 500 MG tablet Take 1 tablet (500 mg total) by mouth every 6 (six) hours as needed. 30 tablet 0   cetirizine (ZYRTEC) 10 MG tablet Take 1 tablet (10 mg total) by mouth daily. 10 tablet 0    ondansetron (ZOFRAN) 8 MG tablet Take 1 tablet (8 mg total) by mouth every 8 (eight) hours as needed for nausea or vomiting. May take 30-60 minutes prior to Temodar administration if nausea/vomiting occurs as needed. 30 tablet 1   propranolol (INDERAL) 40 MG tablet Take 1 tablet (40 mg total) by mouth 2 (two) times daily. 60 tablet 3   SUMAtriptan (IMITREX) 100 MG tablet Take 1 tablet (100 mg total) by mouth every 2 (two) hours as needed for migraine. May repeat in 2 hours if headache persists or recurs. 10 tablet 0   VITAMIN D PO Take by mouth.     No current facility-administered medications on file prior to visit.    Allergies: No Known Allergies Past Medical History:  Past Medical History:  Diagnosis Date   Anxiety 2008   Blood transfusion without reported diagnosis    last PPH delivery   History of placenta accreta in prior pregnancy, currently pregnant    History of sepsis    Oligodendroglioma (HCC)    Sickle cell trait (HCC)    Past Surgical History:  Past Surgical History:  Procedure Laterality Date   ABDOMINAL HYSTERECTOMY  05/12/2017   Procedure: HYSTERECTOMY ABDOMINAL;  Surgeon: Tilda Burrow, MD;  Location: [facility];  Service: Obstetrics;;   APPLICATION OF CRANIAL NAVIGATION N/A 02/04/2020   Procedure: APPLICATION OF CRANIAL NAVIGATION;  Surgeon: Jadene Pierini, MD;  Location: [facility] OR;  Service: Neurosurgery;  Laterality: N/A;  APPLICATION OF CRANIAL NAVIGATION   BRAIN SURGERY  01/05/2020   CERVICAL CONE BIOPSY     CESAREAN SECTION     CESAREAN SECTION N/A  05/12/2017   Procedure: CESAREAN SECTION WITH Hysterectomy;  Surgeon: Tilda Burrow, MD;  Location: [facility] BIRTHING SUITES;  Service: Obstetrics;  Laterality: N/A;   CRANIOTOMY Left 02/04/2020   Procedure: LEFT CRANIOTOMY FOR TUMOR RESECTION;  Surgeon: Jadene Pierini, MD;  Location: [facility] OR;  Service: Neurosurgery;  Laterality: Left;  LEFT CRANIOTOMY FOR TUMOR RESECTION   TIBIA IM NAIL INSERTION  Right 12/13/2019   Procedure: INTRAMEDULLARY (IM) NAIL TIBIAL;  Surgeon: Roby Lofts, MD;  Location: [facility] OR;  Service: Orthopedics;  Laterality: Right;   Social History:  Social History   Socioeconomic History   Marital status: Single    Spouse name: Not on file   Number of children: 4   Years of education: Not on file   Highest education level: 12th grade  Occupational History   Not on file  Tobacco Use   Smoking status: Former    Current packs/day: 0.00    Average packs/day: 0.3 packs/day for 15.0 years (3.8 ttl pk-yrs)    Types: Cigarettes    Start date: 03/08/2002    Quit date: 03/08/2017    Years since quitting: 6.4   Smokeless tobacco: Never   Tobacco comments:    3 cigarettes a day  Vaping Use   Vaping status: Never Used  Substance and Sexual Activity   Alcohol use: Not Currently    Alcohol/week: 1.0 standard drink of alcohol    Types: 1 Glasses of wine per week    Comment: On occasions   Drug use: Not Currently    Frequency: 2.0 times per week    Types: Marijuana    Comment: last used 04/09/17   Sexual activity: Yes    Birth control/protection: Condom, None  Other Topics Concern   Not on file  Social History Narrative   Not on file   Social Drivers of Health   Financial Resource Strain: Low Risk  (07/14/2023)   Overall Financial Resource Strain (CARDIA)    Difficulty of Paying Living Expenses: Not hard at all  Food Insecurity: No Food Insecurity (07/20/2023)   Hunger Vital Sign    Worried About Running Out of Food in the Last Year: Never true    Ran Out of Food in the Last Year: Never true  Transportation Needs: No Transportation Needs (07/20/2023)   PRAPARE - Administrator, Civil Service (Medical): No    Lack of Transportation (Non-Medical): No  Physical Activity: Inactive (07/14/2023)   Exercise Vital Sign    Days of Exercise per Week: 0 days    Minutes of Exercise per Session: 0 min  Stress: No Stress Concern Present (07/14/2023)   [employer] of Occupational Health - Occupational Stress Questionnaire    Feeling of Stress : Not at all  Social Connections: Moderately Isolated (07/14/2023)   Social Connection and Isolation Panel [NHANES]    Frequency of Communication with Friends and Family: More than three times a week    Frequency of Social Gatherings with Friends and Family: Once a week    Attends Religious Services: More than 4 times per year    Active Member of [employer] or Organizations: No    Attends Banker Meetings: Never    Marital Status: Never married  Intimate Partner Violence: Not At Risk (07/20/2023)   Humiliation, Afraid, Rape, and Kick questionnaire    Fear of Current or Ex-Partner: No    Emotionally Abused: No    Physically Abused: No    Sexually Abused: No  Family History:  Family History  Problem Relation Age of Onset   Hypertension Mother    HIV/AIDS Father    Cancer Father        lung   Diabetes Sister    Diabetes Maternal Aunt    Diabetes Maternal Uncle    Cancer Paternal Aunt    Cancer Paternal Uncle    Diabetes Maternal Grandmother    Alzheimer's disease Paternal Grandmother     Review of Systems: Constitutional: Doesn't report fevers, chills or abnormal weight loss Eyes: Doesn't report blurriness of vision Ears, nose, mouth, throat, and face: Doesn't report sore throat Respiratory: Doesn't report cough, dyspnea or wheezes Cardiovascular: Doesn't report palpitation, chest discomfort  Gastrointestinal:  Doesn't report nausea, constipation, diarrhea GU: Doesn't report incontinence Skin: Doesn't report skin rashes Neurological: Per HPI Musculoskeletal: Doesn't report joint pain Behavioral/Psych: Doesn't report anxiety  Physical Exam: Vitals:   08/14/23 0953  BP: 131/85  Pulse: 67  Resp: 13  Temp: 98 F (36.7 C)  SpO2: 100%   KPS: 80. General: Alert, cooperative, pleasant, in no acute distress Head: Normal EENT: No conjunctival injection or scleral icterus.   Lungs: Resp effort normal Cardiac: Regular rate Abdomen: Non-distended abdomen Skin: No rashes cyanosis or petechiae. Extremities: No clubbing or edema  Neurologic Exam: Mental Status: Awake, alert, attentive to examiner. Oriented to self and environment. Language is fluent with intact comprehension.  Impaired recall, age advanced psychomotor slowing Cranial Nerves: Visual acuity is grossly normal. Visual fields are full. Extra-ocular movements intact. No ptosis. Face is symmetric Motor: Tone and bulk are normal. Power is full in both arms and legs. Reflexes are symmetric, no pathologic reflexes present.  Sensory: Intact to light touch Gait: Orthopedic limitation only  Labs: I have reviewed the data as listed    Component Value Date/Time   NA 140 08/14/2023 0859   NA 143 01/03/2018 0949   K 3.6 08/14/2023 0859   CL 104 08/14/2023 0859   CO2 30 08/14/2023 0859   GLUCOSE 85 08/14/2023 0859   BUN 11 08/14/2023 0859   BUN 14 01/03/2018 0949   CREATININE 0.74 08/14/2023 0859   CALCIUM 9.5 08/14/2023 0859   PROT 7.0 08/14/2023 0859   PROT 7.6 01/03/2018 0949   ALBUMIN 4.3 08/14/2023 0859   ALBUMIN 4.8 01/03/2018 0949   AST 16 08/14/2023 0859   ALT 13 08/14/2023 0859   ALKPHOS 69 08/14/2023 0859   BILITOT 0.5 08/14/2023 0859   GFRNONAA >60 08/14/2023 0859   GFRAA >60 01/31/2020 1340   Lab Results  Component Value Date   WBC 3.2 (L) 08/14/2023   NEUTROABS 1.9 08/14/2023   HGB 13.9 08/14/2023   HCT 39.7 08/14/2023   MCV 94.7 08/14/2023   PLT 157 08/14/2023      Assessment/Plan Oligodendroglioma, IDH gene mutant and 1p/19q-codeleted (HCC)  Maureen Key is clinically stable today, now having completed cycle #1 of 5-day Temodar.  We recommended continuing treatment with cycle #2 Temozolomide 200 mg/m2, on for five days and off for twenty three days in twenty eight day cycles. The patient will have a complete blood count performed on days 21 and 28 of each cycle,  and a comprehensive metabolic panel performed on day 28 of each cycle. Labs may need to be performed more often. Zofran will prescribed for home use for nausea/vomiting.   Chemotherapy should be held for the following:  ANC less than 1,000  Platelets less than 100,000  LFT or creatinine greater than 2x ULN  If  clinical concerns/contraindications develop  Recommended continuing propanolol 40mg  BID for headache prevention.    We ask that Maureen Key return to clinic in 1 month with MRI brain prior to cycle #3, or sooner as needed.  All questions were answered. The patient knows to call the clinic with any problems, questions or concerns. No barriers to learning were detected.  I have spent a total of 30 minutes of face-to-face and non-face-to-face time, excluding clinical staff time, preparing to see patient, ordering tests and/or medications, counseling the patient, and independently interpreting results and communicating results to the patient/family/caregiver    Henreitta Leber, MD Medical Director of Neuro-Oncology [facility] at [facility] 08/14/23 9:59 AM

## 2023-08-15 ENCOUNTER — Other Ambulatory Visit: Payer: Self-pay

## 2023-08-15 ENCOUNTER — Other Ambulatory Visit: Payer: Medicare Other

## 2023-08-16 ENCOUNTER — Ambulatory Visit (HOSPITAL_COMMUNITY)
Admission: RE | Admit: 2023-08-16 | Discharge: 2023-08-16 | Disposition: A | Discharge status: Home / Self Care | Payer: Medicare Other | Source: Ambulatory Visit | Attending: Internal Medicine | Admitting: Internal Medicine

## 2023-08-16 ENCOUNTER — Other Ambulatory Visit: Payer: Self-pay

## 2023-08-16 DIAGNOSIS — Q9359 Other deletions of part of a chromosome: Secondary | ICD-10-CM

## 2023-08-16 DIAGNOSIS — C719 Malignant neoplasm of brain, unspecified: Secondary | ICD-10-CM | POA: Diagnosis present

## 2023-08-16 MED ORDER — GADOBUTROL 1 MMOL/ML IV SOLN
5.0000 mL | Freq: Once | INTRAVENOUS | Status: AC | PRN
  Administered 2023-08-16: 5 mL via INTRAVENOUS

## 2023-08-17 ENCOUNTER — Other Ambulatory Visit (HOSPITAL_COMMUNITY): Payer: Self-pay

## 2023-08-17 ENCOUNTER — Encounter: Payer: Self-pay | Admitting: Internal Medicine

## 2023-08-17 ENCOUNTER — Other Ambulatory Visit: Payer: Self-pay

## 2023-08-17 ENCOUNTER — Other Ambulatory Visit: Payer: Self-pay | Admitting: Internal Medicine

## 2023-08-17 DIAGNOSIS — C719 Malignant neoplasm of brain, unspecified: Secondary | ICD-10-CM

## 2023-08-17 MED ORDER — TEMOZOLOMIDE 100 MG PO CAPS
200.0000 mg/m2/d | ORAL_CAPSULE | Freq: Every day | ORAL | 0 refills | Status: DC
  Filled 2023-08-17: qty 15, 5d supply, fill #0
  Filled 2023-08-23: qty 15, 28d supply, fill #0
  Filled 2023-08-23: qty 15, 5d supply, fill #0

## 2023-08-23 ENCOUNTER — Other Ambulatory Visit: Payer: Self-pay

## 2023-08-23 ENCOUNTER — Other Ambulatory Visit (HOSPITAL_COMMUNITY): Payer: Self-pay

## 2023-08-23 NOTE — Progress Notes (Signed)
Specialty Pharmacy Ongoing Clinical Assessment Note  Maureen Key is a 43 y.o. female who is being followed by the specialty pharmacy service for RxSp Oncology   Patient's specialty medication(s) reviewed today: Temozolomide (TEMODAR)   Missed doses in the last 4 weeks: 0   Patient/Caregiver did not have any additional questions or concerns.   Therapeutic benefit summary: Unable to assess   Adverse events/side effects summary: No adverse events/side effects   Patient's therapy is appropriate to: Continue    Goals Addressed             This Visit's Progress    Stabilization of disease       Patient is on track. Patient will maintain adherence         Follow up:  3 months  Bobette Mo Specialty Pharmacist

## 2023-08-23 NOTE — Progress Notes (Signed)
Specialty Pharmacy Refill Coordination Note  Maureen Key is a 43 y.o. female contacted today regarding refills of specialty medication(s) Temozolomide [facility])   Patient requested Delivery   Delivery date: 08/25/23   Verified address: [address]  [place] [address]-   Medication will be filled on 08/24/23.

## 2023-08-24 ENCOUNTER — Encounter: Payer: Self-pay | Admitting: Internal Medicine

## 2023-08-24 ENCOUNTER — Other Ambulatory Visit (HOSPITAL_COMMUNITY): Payer: Self-pay

## 2023-08-24 ENCOUNTER — Other Ambulatory Visit: Payer: Self-pay

## 2023-09-11 ENCOUNTER — Inpatient Hospital Stay: Payer: Medicare Other | Attending: Internal Medicine | Admitting: Internal Medicine

## 2023-09-11 ENCOUNTER — Inpatient Hospital Stay: Payer: Medicare Other

## 2023-09-11 ENCOUNTER — Other Ambulatory Visit: Payer: Self-pay

## 2023-09-11 ENCOUNTER — Telehealth: Payer: Self-pay | Admitting: Internal Medicine

## 2023-09-11 ENCOUNTER — Telehealth: Payer: Self-pay | Admitting: *Deleted

## 2023-09-11 VITALS — BP 127/87 | HR 71 | Temp 97.7°F | Resp 14 | Wt 108.9 lb

## 2023-09-11 DIAGNOSIS — Z79630 Long term (current) use of alkylating agent: Secondary | ICD-10-CM | POA: Insufficient documentation

## 2023-09-11 DIAGNOSIS — Z79899 Other long term (current) drug therapy: Secondary | ICD-10-CM | POA: Diagnosis not present

## 2023-09-11 DIAGNOSIS — C719 Malignant neoplasm of brain, unspecified: Secondary | ICD-10-CM | POA: Diagnosis not present

## 2023-09-11 DIAGNOSIS — C711 Malignant neoplasm of frontal lobe: Secondary | ICD-10-CM | POA: Diagnosis present

## 2023-09-11 LAB — CMP (CANCER CENTER ONLY)
ALT: 20 U/L (ref 0–44)
AST: 17 U/L (ref 15–41)
Albumin: 4.4 g/dL (ref 3.5–5.0)
Alkaline Phosphatase: 61 U/L (ref 38–126)
Anion gap: 3 — ABNORMAL LOW (ref 5–15)
BUN: 12 mg/dL (ref 6–20)
CO2: 30 mmol/L (ref 22–32)
Calcium: 9.7 mg/dL (ref 8.9–10.3)
Chloride: 109 mmol/L (ref 98–111)
Creatinine: 0.66 mg/dL (ref 0.44–1.00)
GFR, Estimated: >60 mL/min (ref >60)
Glucose, Bld: 90 mg/dL (ref 70–99)
Potassium: 4.1 mmol/L (ref 3.5–5.1)
Sodium: 142 mmol/L (ref 135–145)
Total Bilirubin: 0.3 mg/dL (ref 0.0–1.2)
Total Protein: 7.1 g/dL (ref 6.5–8.1)

## 2023-09-11 LAB — CBC WITH DIFFERENTIAL (CANCER CENTER ONLY)
Abs Immature Granulocytes: 0.01 10*3/uL (ref 0.00–0.07)
Basophils Absolute: 0 10*3/uL (ref 0.0–0.1)
Basophils Relative: 0 %
Eosinophils Absolute: 0.1 10*3/uL (ref 0.0–0.5)
Eosinophils Relative: 2 %
HCT: 36.9 % (ref 36.0–46.0)
Hemoglobin: 12.9 g/dL (ref 12.0–15.0)
Immature Granulocytes: 0 %
Lymphocytes Relative: 28 %
Lymphs Abs: 0.7 10*3/uL (ref 0.7–4.0)
MCH: 33.3 pg (ref 26.0–34.0)
MCHC: 35 g/dL (ref 30.0–36.0)
MCV: 95.3 fL (ref 80.0–100.0)
Monocytes Absolute: 0.3 10*3/uL (ref 0.1–1.0)
Monocytes Relative: 11 %
Neutro Abs: 1.4 10*3/uL — ABNORMAL LOW (ref 1.7–7.7)
Neutrophils Relative %: 59 %
Platelet Count: 205 10*3/uL (ref 150–400)
RBC: 3.87 MIL/uL (ref 3.87–5.11)
RDW: 12.6 % (ref 11.5–15.5)
WBC Count: 2.4 10*3/uL — ABNORMAL LOW (ref 4.0–10.5)
nRBC: 0 % (ref 0.0–0.2)

## 2023-09-11 MED ORDER — TEMOZOLOMIDE 100 MG PO CAPS
200.0000 mg | ORAL_CAPSULE | Freq: Every day | ORAL | 0 refills | Status: DC
  Filled 2023-09-11: qty 10, 5d supply, fill #0
  Filled 2023-09-12: qty 10, 28d supply, fill #0

## 2023-09-11 MED ORDER — TEMOZOLOMIDE 20 MG PO CAPS
20.0000 mg | ORAL_CAPSULE | Freq: Every day | ORAL | 0 refills | Status: DC
  Filled 2023-09-11: qty 14, 14d supply, fill #0
  Filled 2023-09-12: qty 5, 28d supply, fill #0

## 2023-09-11 NOTE — Progress Notes (Signed)
 [facility] [address]  [place], [place] [address] [phone]   Interval Evaluation  Date of Service: 09/11/23 Patient Name: Maureen Key Patient MRN: [id number] Patient DOB: Jul 05, 1981 Provider: Henreitta Leber, MD  Identifying Statement:  Maureen Key is a 43 y.o. 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 Maurice Small.  Path demonstrates Oligodendroglioma WHO III; IDHmt and 1p/19q co-deleted   01/30/2023 - 03/16/2023 Radiation Therapy   Completes IMRT with concurrent Temodar Mitzi Hansen)   06/27/2023 -  Chemotherapy   5-day Temozolomide 150-200mg /m2     Biomarkers:  MGMT Unknown.  IDH 1/2 Mutated.  1p/19q co-deleted  TERT Unknown   Interval History:  Maureen Key presents for follow up today after completing cycle #2 5-day TMZ, this cycle dose increased.  Tolerated treatment well overall, just mild fatigue.  Migraines remain well controlled with propanolol.  Rarely needing breakthrough analegsia.  Otherwise maintaining activity level at home and work.    H+P (12/20/19) Patient presented to medical attention after trauma, fall while roller skating and fracturing tibia one week ago.  This led to trauma evaluation and CNS imaging which demonstrated left frontal mass ~5cm.  She denies any neurologic symptoms, no headaches or seizures.  Currently in cast and wheelchair following tibial fixation over the weekend, healing well.   Medications: Current Outpatient Medications on File Prior to Visit  Medication Sig Dispense Refill   acetaminophen (TYLENOL) 500 MG tablet Take 1 tablet (500 mg total) by mouth every 6 (six) hours as needed. 30 tablet 0   cetirizine (ZYRTEC) 10 MG tablet Take 1 tablet (10 mg total) by mouth daily. 10 tablet 0   ondansetron (ZOFRAN)  8 MG tablet Take 1 tablet (8 mg total) by mouth every 8 (eight) hours as needed for nausea or vomiting. May take 30-60 minutes prior to Temodar administration if nausea/vomiting occurs as needed. 30 tablet 1   propranolol (INDERAL) 40 MG tablet Take 1 tablet (40 mg total) by mouth 2 (two) times daily. 60 tablet 3   SUMAtriptan (IMITREX) 100 MG tablet Take 1 tablet (100 mg total) by mouth every 2 (two) hours as needed for migraine. May repeat in 2 hours if headache persists or recurs. 10 tablet 0   temozolomide (TEMODAR) 100 MG capsule Take 3 capsules (300 mg total) by mouth daily. Take for 5 days on, then off for 23 days. Repeat every 28 days. May take on an empty stomach to decrease nausea & vomiting. 15 capsule 0   VITAMIN D PO Take by mouth.     No current facility-administered medications on file prior to visit.    Allergies: No Known Allergies Past Medical History:  Past Medical History:  Diagnosis Date   Anxiety 2008   Blood transfusion without reported diagnosis    last PPH delivery   History of placenta accreta in prior pregnancy, currently pregnant    History of sepsis    Oligodendroglioma (HCC)    Sickle cell trait (HCC)    Past Surgical History:  Past Surgical History:  Procedure Laterality Date   ABDOMINAL HYSTERECTOMY  05/12/2017   Procedure: HYSTERECTOMY ABDOMINAL;  Surgeon: Tilda Burrow, MD;  Location: [facility] BIRTHING SUITES;  Service: Obstetrics;;   APPLICATION OF CRANIAL NAVIGATION N/A 02/04/2020   Procedure: APPLICATION OF CRANIAL NAVIGATION;  Surgeon: Jadene Pierini,  MD;  Location: [facility] OR;  Service: Neurosurgery;  Laterality: N/A;  APPLICATION OF CRANIAL NAVIGATION   BRAIN SURGERY  01/05/2020   CERVICAL CONE BIOPSY     CESAREAN SECTION     CESAREAN SECTION N/A 05/12/2017   Procedure: CESAREAN SECTION WITH Hysterectomy;  Surgeon: Tilda Burrow, MD;  Location: [facility] BIRTHING SUITES;  Service: Obstetrics;  Laterality: N/A;   CRANIOTOMY Left 02/04/2020    Procedure: LEFT CRANIOTOMY FOR TUMOR RESECTION;  Surgeon: Jadene Pierini, MD;  Location: [facility] OR;  Service: Neurosurgery;  Laterality: Left;  LEFT CRANIOTOMY FOR TUMOR RESECTION   TIBIA IM NAIL INSERTION Right 12/13/2019   Procedure: INTRAMEDULLARY (IM) NAIL TIBIAL;  Surgeon: Roby Lofts, MD;  Location: [facility] OR;  Service: Orthopedics;  Laterality: Right;   Social History:  Social History   Socioeconomic History   Marital status: Single    Spouse name: Not on file   Number of children: 4   Years of education: Not on file   Highest education level: 12th grade  Occupational History   Not on file  Tobacco Use   Smoking status: Former    Current packs/day: 0.00    Average packs/day: 0.3 packs/day for 15.0 years (3.8 ttl pk-yrs)    Types: Cigarettes    Start date: 03/08/2002    Quit date: 03/08/2017    Years since quitting: 6.5   Smokeless tobacco: Never   Tobacco comments:    3 cigarettes a day  Vaping Use   Vaping status: Never Used  Substance and Sexual Activity   Alcohol use: Not Currently    Alcohol/week: 1.0 standard drink of alcohol    Types: 1 Glasses of wine per week    Comment: On occasions   Drug use: Not Currently    Frequency: 2.0 times per week    Types: Marijuana    Comment: last used 04/09/17   Sexual activity: Yes    Birth control/protection: Condom, None  Other Topics Concern   Not on file  Social History Narrative   Not on file   Social Drivers of Health   Financial Resource Strain: Low Risk  (07/14/2023)   Overall Financial Resource Strain (CARDIA)    Difficulty of Paying Living Expenses: Not hard at all  Food Insecurity: No Food Insecurity (07/20/2023)   Hunger Vital Sign    Worried About Running Out of Food in the Last Year: Never true    Ran Out of Food in the Last Year: Never true  Transportation Needs: No Transportation Needs (07/20/2023)   PRAPARE - Administrator, Civil Service (Medical): No    Lack of Transportation (Non-Medical):  No  Physical Activity: Inactive (07/14/2023)   Exercise Vital Sign    Days of Exercise per Week: 0 days    Minutes of Exercise per Session: 0 min  Stress: No Stress Concern Present (07/14/2023)   [employer] of Occupational Health - Occupational Stress Questionnaire    Feeling of Stress : Not at all  Social Connections: Moderately Isolated (07/14/2023)   Social Connection and Isolation Panel [NHANES]    Frequency of Communication with Friends and Family: More than three times a week    Frequency of Social Gatherings with Friends and Family: Once a week    Attends Religious Services: More than 4 times per year    Active Member of [employer] or Organizations: No    Attends Banker Meetings: Never    Marital Status: Never married  Intimate Partner Violence:  Not At Risk (07/20/2023)   Humiliation, Afraid, Rape, and Kick questionnaire    Fear of Current or Ex-Partner: No    Emotionally Abused: No    Physically Abused: No    Sexually Abused: No   Family History:  Family History  Problem Relation Age of Onset   Hypertension Mother    HIV/AIDS Father    Cancer Father        lung   Diabetes Sister    Diabetes Maternal Aunt    Diabetes Maternal Uncle    Cancer Paternal Aunt    Cancer Paternal Uncle    Diabetes Maternal Grandmother    Alzheimer's disease Paternal Grandmother     Review of Systems: Constitutional: Doesn't report fevers, chills or abnormal weight loss Eyes: Doesn't report blurriness of vision Ears, nose, mouth, throat, and face: Doesn't report sore throat Respiratory: Doesn't report cough, dyspnea or wheezes Cardiovascular: Doesn't report palpitation, chest discomfort  Gastrointestinal:  Doesn't report nausea, constipation, diarrhea GU: Doesn't report incontinence Skin: Doesn't report skin rashes Neurological: Per HPI Musculoskeletal: Doesn't report joint pain Behavioral/Psych: Doesn't report anxiety  Physical Exam: There were no vitals filed for this  visit.  KPS: 80. General: Alert, cooperative, pleasant, in no acute distress Head: Normal EENT: No conjunctival injection or scleral icterus.  Lungs: Resp effort normal Cardiac: Regular rate Abdomen: Non-distended abdomen Skin: No rashes cyanosis or petechiae. Extremities: No clubbing or edema  Neurologic Exam: Mental Status: Awake, alert, attentive to examiner. Oriented to self and environment. Language is fluent with intact comprehension.  Impaired recall, age advanced psychomotor slowing Cranial Nerves: Visual acuity is grossly normal. Visual fields are full. Extra-ocular movements intact. No ptosis. Face is symmetric Motor: Tone and bulk are normal. Power is full in both arms and legs. Reflexes are symmetric, no pathologic reflexes present.  Sensory: Intact to light touch Gait: Orthopedic limitation only  Labs: I have reviewed the data as listed    Component Value Date/Time   NA 140 08/14/2023 0859   NA 143 01/03/2018 0949   K 3.6 08/14/2023 0859   CL 104 08/14/2023 0859   CO2 30 08/14/2023 0859   GLUCOSE 85 08/14/2023 0859   BUN 11 08/14/2023 0859   BUN 14 01/03/2018 0949   CREATININE 0.74 08/14/2023 0859   CALCIUM 9.5 08/14/2023 0859   PROT 7.0 08/14/2023 0859   PROT 7.6 01/03/2018 0949   ALBUMIN 4.3 08/14/2023 0859   ALBUMIN 4.8 01/03/2018 0949   AST 16 08/14/2023 0859   ALT 13 08/14/2023 0859   ALKPHOS 69 08/14/2023 0859   BILITOT 0.5 08/14/2023 0859   GFRNONAA >60 08/14/2023 0859   GFRAA >60 01/31/2020 1340   Lab Results  Component Value Date   WBC 3.2 (L) 08/14/2023   NEUTROABS 1.9 08/14/2023   HGB 13.9 08/14/2023   HCT 39.7 08/14/2023   MCV 94.7 08/14/2023   PLT 157 08/14/2023    Imaging:  [facility] Clinician Interpretation: I have personally reviewed the CNS images as listed.  My interpretation, in the context of the patient's clinical presentation, is stable disease  MR BRAIN W WO CONTRAST Result Date: 08/16/2023 CLINICAL DATA:  Provided history:  Oligodendroglioma, IDH gene mutant and 1p/19q codeleted. Brain/CNS neoplasm, assess treatment response. EXAM: MRI HEAD WITHOUT AND WITH CONTRAST TECHNIQUE: Multiplanar, multiecho pulse sequences of the brain and surrounding structures were obtained without and with intravenous contrast. CONTRAST:  5mL GADAVIST GADOBUTROL 1 MMOL/ML IV SOLN COMPARISON:  Prior brain MRI examinations 06/09/2023 and earlier. FINDINGS: Brain: Again demonstrated  are postsurgical changes from prior left frontal lobe mass resection. Small amount of chronic postsurgical blood products again noted within the resection cavity. T2 FLAIR hyperintense parenchymal signal abnormality surrounding the resection cavity, stable from the most recent prior brain MRI of 06/09/2023. Linear/scar-like enhancement along portions of the resection cavity, also stable. There is no acute infarct. No extra-axial fluid collection. No midline shift. Vascular: Maintained flow voids within the proximal large arterial vessels. Skull and upper cervical spine: No focal worrisome marrow lesion. Left frontoparietal cranioplasty. Sinuses/Orbits: No mass or acute finding within the imaged orbits. Mild mucosal thickening scattered within the paranasal sinuses. IMPRESSION: Unchanged appearance of the left frontal lobe treatment site as compared to the most recent prior brain MRI of 05/31/2023. T2 FLAIR hyperintense parenchymal signal abnormality surrounding the resection cavity has remained stable. No new enhancement. Electronically Signed   By: Jackey Loge D.O.   On: 08/16/2023 10:42    Assessment/Plan Oligodendroglioma, IDH gene mutant and 1p/19q-codeleted (HCC)  Maureen Key is clinically stable today, now having completed cycle #2 of 5-day Temodar.  MRI brain demonstrates normal findings.  Will dose reduce next cycle of Temodar to 150mg /m2 due to ANC 1.4 today.  We recommended continuing treatment with cycle #3 Temozolomide 150mg /m2, on for five days and  off for twenty three days in twenty eight day cycles. The patient will have a complete blood count performed on days 21 and 28 of each cycle, and a comprehensive metabolic panel performed on day 28 of each cycle. Labs may need to be performed more often. Zofran will prescribed for home use for nausea/vomiting.   Chemotherapy should be held for the following:  ANC less than 1,000  Platelets less than 100,000  LFT or creatinine greater than 2x ULN  If clinical concerns/contraindications develop  Recommended continuing propanolol 40mg  BID for headache prevention.    We ask that Maureen Key return to clinic in 1 month with labs prior to cycle #3, or sooner as needed.  Next MRI may follow cycle #4.  All questions were answered. The patient knows to call the clinic with any problems, questions or concerns. No barriers to learning were detected.  I have spent a total of 40 minutes of face-to-face and non-face-to-face time, excluding clinical staff time, preparing to see patient, ordering tests and/or medications, counseling the patient, and independently interpreting results and communicating results to the patient/family/caregiver    Henreitta Leber, MD Medical Director of Neuro-Oncology [facility] 09/11/23 9:39 AM

## 2023-09-11 NOTE — Telephone Encounter (Signed)
Error

## 2023-09-11 NOTE — Telephone Encounter (Signed)
 Patient Scheduled appts. Patient is aware of all appt details.

## 2023-09-12 ENCOUNTER — Other Ambulatory Visit: Payer: Self-pay

## 2023-09-12 ENCOUNTER — Other Ambulatory Visit (HOSPITAL_COMMUNITY): Payer: Self-pay

## 2023-09-12 NOTE — Progress Notes (Signed)
 Specialty Pharmacy Refill Coordination Note  Maureen Key is a 43 y.o. female contacted today regarding refills of specialty medication(s) Temodar  Patient requested (Patient-Rptd) Delivery   Delivery date: (Patient-Rptd) 09/12/23   Verified address: (Patient-Rptd) [address] [place] [address]   Medication will be filled on 09/13/23. New delivery date is 09/14/23. Patient is aware.

## 2023-09-13 ENCOUNTER — Other Ambulatory Visit: Payer: Self-pay

## 2023-09-13 ENCOUNTER — Other Ambulatory Visit (HOSPITAL_COMMUNITY): Payer: Self-pay

## 2023-09-13 NOTE — Progress Notes (Addendum)
 09/13/23-CA: Temodar  Now shipping 03/06. Did not come in order as expected. Patient aware of new delivery date.

## 2023-09-14 ENCOUNTER — Other Ambulatory Visit: Payer: Self-pay

## 2023-09-14 ENCOUNTER — Other Ambulatory Visit (HOSPITAL_COMMUNITY): Payer: Self-pay

## 2023-09-15 ENCOUNTER — Other Ambulatory Visit: Payer: Self-pay

## 2023-09-18 ENCOUNTER — Other Ambulatory Visit (HOSPITAL_COMMUNITY): Payer: Self-pay

## 2023-09-27 ENCOUNTER — Other Ambulatory Visit (HOSPITAL_COMMUNITY): Payer: Self-pay | Admitting: Family Medicine

## 2023-09-27 DIAGNOSIS — Z1231 Encounter for screening mammogram for malignant neoplasm of breast: Secondary | ICD-10-CM

## 2023-10-03 ENCOUNTER — Other Ambulatory Visit: Payer: Self-pay

## 2023-10-09 ENCOUNTER — Inpatient Hospital Stay (HOSPITAL_COMMUNITY): Admission: RE | Admit: 2023-10-09 | Source: Ambulatory Visit

## 2023-10-09 ENCOUNTER — Inpatient Hospital Stay

## 2023-10-09 ENCOUNTER — Inpatient Hospital Stay (HOSPITAL_BASED_OUTPATIENT_CLINIC_OR_DEPARTMENT_OTHER): Admitting: Internal Medicine

## 2023-10-09 ENCOUNTER — Other Ambulatory Visit (HOSPITAL_COMMUNITY): Payer: Self-pay

## 2023-10-09 VITALS — BP 129/78 | HR 88 | Temp 97.8°F | Resp 18 | Ht 60.0 in | Wt 106.0 lb

## 2023-10-09 DIAGNOSIS — C719 Malignant neoplasm of brain, unspecified: Secondary | ICD-10-CM | POA: Diagnosis not present

## 2023-10-09 DIAGNOSIS — C711 Malignant neoplasm of frontal lobe: Secondary | ICD-10-CM | POA: Diagnosis not present

## 2023-10-09 LAB — CMP (CANCER CENTER ONLY)
ALT: 15 U/L (ref 0–44)
AST: 14 U/L — ABNORMAL LOW (ref 15–41)
Albumin: 4.2 g/dL (ref 3.5–5.0)
Alkaline Phosphatase: 53 U/L (ref 38–126)
Anion gap: 6 (ref 5–15)
BUN: 11 mg/dL (ref 6–20)
CO2: 29 mmol/L (ref 22–32)
Calcium: 9.2 mg/dL (ref 8.9–10.3)
Chloride: 107 mmol/L (ref 98–111)
Creatinine: 0.68 mg/dL (ref 0.44–1.00)
GFR, Estimated: >60 mL/min (ref >60)
Glucose, Bld: 75 mg/dL (ref 70–99)
Potassium: 3.3 mmol/L — ABNORMAL LOW (ref 3.5–5.1)
Sodium: 142 mmol/L (ref 135–145)
Total Bilirubin: 0.3 mg/dL (ref 0.0–1.2)
Total Protein: 6.5 g/dL (ref 6.5–8.1)

## 2023-10-09 LAB — CBC WITH DIFFERENTIAL (CANCER CENTER ONLY)
Abs Immature Granulocytes: 0 10*3/uL (ref 0.00–0.07)
Basophils Absolute: 0 10*3/uL (ref 0.0–0.1)
Basophils Relative: 1 %
Eosinophils Absolute: 0 10*3/uL (ref 0.0–0.5)
Eosinophils Relative: 2 %
HCT: 31.4 % — ABNORMAL LOW (ref 36.0–46.0)
Hemoglobin: 11.1 g/dL — ABNORMAL LOW (ref 12.0–15.0)
Immature Granulocytes: 0 %
Lymphocytes Relative: 27 %
Lymphs Abs: 0.5 10*3/uL — ABNORMAL LOW (ref 0.7–4.0)
MCH: 33.4 pg (ref 26.0–34.0)
MCHC: 35.4 g/dL (ref 30.0–36.0)
MCV: 94.6 fL (ref 80.0–100.0)
Monocytes Absolute: 0.2 10*3/uL (ref 0.1–1.0)
Monocytes Relative: 14 %
Neutro Abs: 1 10*3/uL — ABNORMAL LOW (ref 1.7–7.7)
Neutrophils Relative %: 56 %
Platelet Count: 151 10*3/uL (ref 150–400)
RBC: 3.32 MIL/uL — ABNORMAL LOW (ref 3.87–5.11)
RDW: 13.6 % (ref 11.5–15.5)
WBC Count: 1.8 10*3/uL — ABNORMAL LOW (ref 4.0–10.5)
nRBC: 0 % (ref 0.0–0.2)

## 2023-10-09 MED ORDER — DEXAMETHASONE 2 MG PO TABS
2.0000 mg | ORAL_TABLET | Freq: Every day | ORAL | 0 refills | Status: DC
  Filled 2023-10-09 (×2): qty 7, 7d supply, fill #0

## 2023-10-09 NOTE — Progress Notes (Signed)
 [facility] at [facility] [address]  [place], [place] [address] [phone]   Interval Evaluation  Date of Service: 10/09/23 Patient Name: Maureen Key Patient MRN: [id number] Patient DOB: 1980-08-12 Provider: Henreitta Leber, MD  Identifying Statement:  Maureen Key is a 43 y.o. 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 Maurice Small.  Path demonstrates Oligodendroglioma WHO III; IDHmt and 1p/19q co-deleted   01/30/2023 - 03/16/2023 Radiation Therapy   Completes IMRT with concurrent Temodar Mitzi Hansen)   06/27/2023 -  Chemotherapy   5-day Temozolomide 150-200mg /m2     Biomarkers:  MGMT Unknown.  IDH 1/2 Mutated.  1p/19q co-deleted  TERT Unknown   Interval History:  Maureen Key presents for follow up today after completing cycle #3 5-day TMZ, dose reduced this past month.  Tolerated treatment well overall, just mild fatigue and poor appetite.  Migraines remain well controlled with propanolol.  Rarely needing breakthrough analegsia.  Otherwise maintaining activity level at home and work.    H+P (12/20/19) Patient presented to medical attention after trauma, fall while roller skating and fracturing tibia one week ago.  This led to trauma evaluation and CNS imaging which demonstrated left frontal mass ~5cm.  She denies any neurologic symptoms, no headaches or seizures.  Currently in cast and wheelchair following tibial fixation over the weekend, healing well.   Medications: Current Outpatient Medications on File Prior to Visit  Medication Sig Dispense Refill   acetaminophen (TYLENOL) 500 MG tablet Take 1 tablet (500 mg total) by mouth every 6 (six) hours as needed. 30 tablet 0   cetirizine (ZYRTEC) 10 MG tablet Take 1 tablet (10 mg total) by mouth daily. 10 tablet 0    ondansetron (ZOFRAN) 8 MG tablet Take 1 tablet (8 mg total) by mouth every 8 (eight) hours as needed for nausea or vomiting. May take 30-60 minutes prior to Temodar administration if nausea/vomiting occurs as needed. 30 tablet 1   propranolol (INDERAL) 40 MG tablet Take 1 tablet (40 mg total) by mouth 2 (two) times daily. 60 tablet 3   SUMAtriptan (IMITREX) 100 MG tablet Take 1 tablet (100 mg total) by mouth every 2 (two) hours as needed for migraine. May repeat in 2 hours if headache persists or recurs. 10 tablet 0   VITAMIN D PO Take by mouth.     No current facility-administered medications on file prior to visit.    Allergies: No Known Allergies Past Medical History:  Past Medical History:  Diagnosis Date   Anxiety 2008   Blood transfusion without reported diagnosis    last PPH delivery   History of placenta accreta in prior pregnancy, currently pregnant    History of sepsis    Oligodendroglioma (HCC)    Sickle cell trait (HCC)    Past Surgical History:  Past Surgical History:  Procedure Laterality Date   ABDOMINAL HYSTERECTOMY  05/12/2017   Procedure: HYSTERECTOMY ABDOMINAL;  Surgeon: Tilda Burrow, MD;  Location: [facility];  Service: Obstetrics;;   APPLICATION OF CRANIAL NAVIGATION N/A 02/04/2020   Procedure: APPLICATION OF CRANIAL NAVIGATION;  Surgeon: Jadene Pierini, MD;  [facility]: [facility] OR;  Service: Neurosurgery;  Laterality: N/A;  APPLICATION OF CRANIAL NAVIGATION   BRAIN SURGERY  01/05/2020   CERVICAL CONE BIOPSY     CESAREAN SECTION     CESAREAN SECTION N/A  05/12/2017   Procedure: CESAREAN SECTION WITH Hysterectomy;  Surgeon: Tilda Burrow, MD;  Location: [facility] BIRTHING SUITES;  Service: Obstetrics;  Laterality: N/A;   CRANIOTOMY Left 02/04/2020   Procedure: LEFT CRANIOTOMY FOR TUMOR RESECTION;  Surgeon: Jadene Pierini, MD;  Location: [facility] OR;  Service: Neurosurgery;  Laterality: Left;  LEFT CRANIOTOMY FOR TUMOR RESECTION   TIBIA IM NAIL INSERTION Right  12/13/2019   Procedure: INTRAMEDULLARY (IM) NAIL TIBIAL;  Surgeon: Roby Lofts, MD;  Location: [facility] OR;  Service: Orthopedics;  Laterality: Right;   Social History:  Social History   Socioeconomic History   Marital status: Single    Spouse name: Not on file   Number of children: 4   Years of education: Not on file   Highest education level: 12th grade  Occupational History   Not on file  Tobacco Use   Smoking status: Former    Current packs/day: 0.00    Average packs/day: 0.3 packs/day for 15.0 years (3.8 ttl pk-yrs)    Types: Cigarettes    Start date: 03/08/2002    Quit date: 03/08/2017    Years since quitting: 6.5   Smokeless tobacco: Never   Tobacco comments:    3 cigarettes a day  Vaping Use   Vaping status: Never Used  Substance and Sexual Activity   Alcohol use: Not Currently    Alcohol/week: 1.0 standard drink of alcohol    Types: 1 Glasses of wine per week    Comment: On occasions   Drug use: Not Currently    Frequency: 2.0 times per week    Types: Marijuana    Comment: last used 04/09/17   Sexual activity: Yes    Birth control/protection: Condom, None  Other Topics Concern   Not on file  Social History Narrative   Not on file   Social Drivers of Health   Financial Resource Strain: Low Risk  (07/14/2023)   Overall Financial Resource Strain (CARDIA)    Difficulty of Paying Living Expenses: Not hard at all  Food Insecurity: No Food Insecurity (07/20/2023)   Hunger Vital Sign    Worried About Running Out of Food in the Last Year: Never true    Ran Out of Food in the Last Year: Never true  Transportation Needs: No Transportation Needs (07/20/2023)   PRAPARE - Administrator, Civil Service (Medical): No    Lack of Transportation (Non-Medical): No  Physical Activity: Inactive (07/14/2023)   Exercise Vital Sign    Days of Exercise per Week: 0 days    Minutes of Exercise per Session: 0 min  Stress: No Stress Concern Present (07/14/2023)   [employer]  of Occupational Health - Occupational Stress Questionnaire    Feeling of Stress : Not at all  Social Connections: Moderately Isolated (07/14/2023)   Social Connection and Isolation Panel [NHANES]    Frequency of Communication with Friends and Family: More than three times a week    Frequency of Social Gatherings with Friends and Family: Once a week    Attends Religious Services: More than 4 times per year    Active Member of [employer] or Organizations: No    Attends Banker Meetings: Never    Marital Status: Never married  Intimate Partner Violence: Not At Risk (07/20/2023)   Humiliation, Afraid, Rape, and Kick questionnaire    Fear of Current or Ex-Partner: No    Emotionally Abused: No    Physically Abused: No    Sexually Abused: No  Family History:  Family History  Problem Relation Age of Onset   Hypertension Mother    HIV/AIDS Father    Cancer Father        lung   Diabetes Sister    Diabetes Maternal Aunt    Diabetes Maternal Uncle    Cancer Paternal Aunt    Cancer Paternal Uncle    Diabetes Maternal Grandmother    Alzheimer's disease Paternal Grandmother     Review of Systems: Constitutional: Doesn't report fevers, chills or abnormal weight loss Eyes: Doesn't report blurriness of vision Ears, nose, mouth, throat, and face: Doesn't report sore throat Respiratory: Doesn't report cough, dyspnea or wheezes Cardiovascular: Doesn't report palpitation, chest discomfort  Gastrointestinal:  Doesn't report nausea, constipation, diarrhea GU: Doesn't report incontinence Skin: Doesn't report skin rashes Neurological: Per HPI Musculoskeletal: Doesn't report joint pain Behavioral/Psych: Doesn't report anxiety  Physical Exam: Vitals:   10/09/23 0925  BP: 129/78  Pulse: 88  Resp: 18  Temp: 97.8 F (36.6 C)  SpO2: 100%    KPS: 80. General: Alert, cooperative, pleasant, in no acute distress Head: Normal EENT: No conjunctival injection or scleral icterus.  Lungs:  Resp effort normal Cardiac: Regular rate Abdomen: Non-distended abdomen Skin: No rashes cyanosis or petechiae. Extremities: No clubbing or edema  Neurologic Exam: Mental Status: Awake, alert, attentive to examiner. Oriented to self and environment. Language is fluent with intact comprehension.  Impaired recall, age advanced psychomotor slowing Cranial Nerves: Visual acuity is grossly normal. Visual fields are full. Extra-ocular movements intact. No ptosis. Face is symmetric Motor: Tone and bulk are normal. Power is full in both arms and legs. Reflexes are symmetric, no pathologic reflexes present.  Sensory: Intact to light touch Gait: Orthopedic limitation only  Labs: I have reviewed the data as listed    Component Value Date/Time   NA 142 09/11/2023 0930   NA 143 01/03/2018 0949   K 4.1 09/11/2023 0930   CL 109 09/11/2023 0930   CO2 30 09/11/2023 0930   GLUCOSE 90 09/11/2023 0930   BUN 12 09/11/2023 0930   BUN 14 01/03/2018 0949   CREATININE 0.66 09/11/2023 0930   CALCIUM 9.7 09/11/2023 0930   PROT 7.1 09/11/2023 0930   PROT 7.6 01/03/2018 0949   ALBUMIN 4.4 09/11/2023 0930   ALBUMIN 4.8 01/03/2018 0949   AST 17 09/11/2023 0930   ALT 20 09/11/2023 0930   ALKPHOS 61 09/11/2023 0930   BILITOT 0.3 09/11/2023 0930   GFRNONAA >60 09/11/2023 0930   GFRAA >60 01/31/2020 1340   Lab Results  Component Value Date   WBC 1.8 (L) 10/09/2023   NEUTROABS 1.0 (L) 10/09/2023   HGB 11.1 (L) 10/09/2023   HCT 31.4 (L) 10/09/2023   MCV 94.6 10/09/2023   PLT 151 10/09/2023     Assessment/Plan Oligodendroglioma, IDH gene mutant and 1p/19q-codeleted (HCC)  Maureen Key is clinically stable today, now having completed cycle #3 of 5-day Temodar.    Neurtopenia is noted today with ANC down to 1.0 despite Temodar dose reduction.    We recommended holding off on chemotherapy this month and repeating MRI brain instead.  She is agreeable with this.  IDH inhibitor could be  considered as an alternative.  Chemotherapy should be held for the following:  ANC less than 1,000  Platelets less than 100,000  LFT or creatinine greater than 2x ULN  If clinical concerns/contraindications develop  Recommended continuing propanolol 40mg  BID for headache prevention.    We ask that Maureen Key return  to clinic in 1 month MRI brain prior to (potential) cycle #3, or sooner as needed.   All questions were answered. The patient knows to call the clinic with any problems, questions or concerns. No barriers to learning were detected.  I have spent a total of 30 minutes of face-to-face and non-face-to-face time, excluding clinical staff time, preparing to see patient, ordering tests and/or medications, counseling the patient, and independently interpreting results and communicating results to the patient/family/caregiver    Henreitta Leber, MD Medical Director of Neuro-Oncology [facility] at [facility] 10/09/23 9:29 AM

## 2023-10-10 ENCOUNTER — Telehealth: Payer: Self-pay | Admitting: Internal Medicine

## 2023-10-10 ENCOUNTER — Other Ambulatory Visit (HOSPITAL_COMMUNITY): Payer: Self-pay

## 2023-10-10 NOTE — Telephone Encounter (Signed)
 Patient scheduled appointments. Patient is aware of all appointment details.

## 2023-10-12 ENCOUNTER — Encounter: Payer: Self-pay | Admitting: Advanced Practice Midwife

## 2023-10-12 ENCOUNTER — Ambulatory Visit: Admitting: Advanced Practice Midwife

## 2023-10-12 ENCOUNTER — Other Ambulatory Visit (HOSPITAL_COMMUNITY)
Admission: RE | Admit: 2023-10-12 | Discharge: 2023-10-12 | Disposition: A | Discharge status: Home / Self Care | Source: Ambulatory Visit | Attending: Advanced Practice Midwife | Admitting: Advanced Practice Midwife

## 2023-10-12 VITALS — BP 106/70 | HR 64 | Ht 60.0 in | Wt 104.0 lb

## 2023-10-12 DIAGNOSIS — N898 Other specified noninflammatory disorders of vagina: Secondary | ICD-10-CM | POA: Diagnosis not present

## 2023-10-12 DIAGNOSIS — Z113 Encounter for screening for infections with a predominantly sexual mode of transmission: Secondary | ICD-10-CM | POA: Diagnosis not present

## 2023-10-12 MED ORDER — AZITHROMYCIN 500 MG PO TABS: 1000.0000 mg | ORAL_TABLET | Freq: Once | ORAL | 0 refills | Status: AC

## 2023-10-12 NOTE — Progress Notes (Signed)
 [facility] Clinic Visit  Patient name: Maureen Key MRN [id number]  Date of birth: 1980-09-02  CC & HPI:  Maureen Key is a 43 y.o. African American female presenting today for STD screen after known exposure to chlamydia.  No sx, but does notice an odor after IC.  Some meds she is on for her brain tumor can disrupt vagianl pH  Pertinent History Reviewed:  Medical & Surgical Hx:   Past Medical History:  Diagnosis Date   Anxiety 2008   Blood transfusion without reported diagnosis    last PPH delivery   History of placenta accreta in prior pregnancy, currently pregnant    History of sepsis    Oligodendroglioma (HCC)    Sickle cell trait (HCC)    Past Surgical History:  Procedure Laterality Date   ABDOMINAL HYSTERECTOMY  05/12/2017   Procedure: HYSTERECTOMY ABDOMINAL;  Surgeon: Tilda Burrow, MD;  Location: [facility] BIRTHING SUITES;  Service: Obstetrics;;   APPLICATION OF CRANIAL NAVIGATION N/A 02/04/2020   Procedure: APPLICATION OF CRANIAL NAVIGATION;  Surgeon: Jadene Pierini, MD;  [facility]: [facility] OR;  Service: Neurosurgery;  Laterality: N/A;  APPLICATION OF CRANIAL NAVIGATION   BRAIN SURGERY  01/05/2020   CERVICAL CONE BIOPSY     CESAREAN SECTION     CESAREAN SECTION N/A 05/12/2017   Procedure: CESAREAN SECTION WITH Hysterectomy;  Surgeon: Tilda Burrow, MD;  Location: [facility] BIRTHING SUITES;  Service: Obstetrics;  Laterality: N/A;   CRANIOTOMY Left 02/04/2020   Procedure: LEFT CRANIOTOMY FOR TUMOR RESECTION;  Surgeon: Jadene Pierini, MD;  Location: [facility] OR;  Service: Neurosurgery;  Laterality: Left;  LEFT CRANIOTOMY FOR TUMOR RESECTION   TIBIA IM NAIL INSERTION Right 12/13/2019   Procedure: INTRAMEDULLARY (IM) NAIL TIBIAL;  Surgeon: Roby Lofts, MD;  Location: [facility];  Service: Orthopedics;  Laterality: Right;   Family History  Problem Relation Age of Onset   Hypertension Mother    HIV/AIDS Father    Cancer Father        lung   Diabetes Sister     Diabetes Maternal Aunt    Diabetes Maternal Uncle    Cancer Paternal Aunt    Cancer Paternal Uncle    Diabetes Maternal Grandmother    Alzheimer's disease Paternal Grandmother     Current Outpatient Medications:    dexamethasone (DECADRON) 2 MG tablet, Take 1 tablet (2 mg total) by mouth daily., Disp: 7 tablet, Rfl: 0   acetaminophen (TYLENOL) 500 MG tablet, Take 1 tablet (500 mg total) by mouth every 6 (six) hours as needed. (Patient not taking: Reported on 10/12/2023), Disp: 30 tablet, Rfl: 0   cetirizine (ZYRTEC) 10 MG tablet, Take 1 tablet (10 mg total) by mouth daily. (Patient not taking: Reported on 10/12/2023), Disp: 10 tablet, Rfl: 0   ondansetron (ZOFRAN) 8 MG tablet, Take 1 tablet (8 mg total) by mouth every 8 (eight) hours as needed for nausea or vomiting. May take 30-60 minutes prior to Temodar administration if nausea/vomiting occurs as needed. (Patient not taking: Reported on 10/12/2023), Disp: 30 tablet, Rfl: 1   propranolol (INDERAL) 40 MG tablet, Take 1 tablet (40 mg total) by mouth 2 (two) times daily. (Patient not taking: Reported on 10/12/2023), Disp: 60 tablet, Rfl: 3   SUMAtriptan (IMITREX) 100 MG tablet, Take 1 tablet (100 mg total) by mouth every 2 (two) hours as needed for migraine. May repeat in 2 hours if headache persists or recurs. (Patient not taking: Reported on 10/12/2023), Disp: 10 tablet, Rfl:  0   VITAMIN D PO, Take by mouth. (Patient not taking: Reported on 10/12/2023), Disp: , Rfl:  Social History: Reviewed -  reports that she quit smoking about 6 years ago. Her smoking use included cigarettes. She started smoking about 21 years ago. She has a 3.8 pack-year smoking history. She has never used smokeless tobacco.  Review of Systems:   Constitutional: Negative for fever and chills Eyes: Negative for visual disturbances Respiratory: Negative for shortness of breath, dyspnea Cardiovascular: Negative for chest pain or palpitations  Gastrointestinal: Negative for vomiting,  diarrhea and constipation; no abdominal pain Genitourinary: Negative for dysuria and urgency, vaginal irritation or itching Musculoskeletal: Negative for back pain, joint pain, myalgias  Neurological: Negative for dizziness and headaches    Objective Findings:    Physical Examination: Vitals:   10/12/23 1135  BP: 106/70  Pulse: 64   General appearance - well appearing, and in no distress Mental status - alert, oriented to person, place, and time Chest:  Normal respiratory effort Heart - normal rate and regular rhythm Abdomen:  Soft, nontender Pelvic: normal appearing . Swabs collected Musculoskeletal:  Normal range of motion without pain Extremities:  No edema    No results found for this or any previous visit (from the past 24 hours).    Assessment & Plan:  A:   STD screen  Exposure to Chlamydia P:   Meds ordered this encounter  Medications   azithromycin (ZITHROMAX) 500 MG tablet    Sig: Take 2 tablets (1,000 mg total) by mouth once for 1 dose.    Dispense:  2 tablet    Refill:  0    Supervising Provider:   Duane Lope H [[id number]]   Orders Placed This Encounter  Procedures   HIV Antibody (routine testing w rflx)   RPR   Swab for BV,STI   No follow-ups on file.  [name] CNM 10/12/2023 11:58 AM

## 2023-10-13 LAB — CERVICOVAGINAL ANCILLARY ONLY
Bacterial Vaginitis (gardnerella): POSITIVE — AB
Chlamydia: POSITIVE — AB
Comment: NEGATIVE
Comment: NEGATIVE
Comment: NEGATIVE
Comment: NORMAL
Neisseria Gonorrhea: NEGATIVE
Trichomonas: NEGATIVE

## 2023-10-13 LAB — RPR: RPR Ser Ql: NONREACTIVE

## 2023-10-13 LAB — HIV ANTIBODY (ROUTINE TESTING W REFLEX): HIV Screen 4th Generation wRfx: NONREACTIVE

## 2023-10-17 ENCOUNTER — Other Ambulatory Visit: Payer: Self-pay

## 2023-10-19 ENCOUNTER — Other Ambulatory Visit: Payer: Self-pay | Admitting: Advanced Practice Midwife

## 2023-10-19 ENCOUNTER — Encounter: Payer: Self-pay | Admitting: Advanced Practice Midwife

## 2023-10-19 MED ORDER — METRONIDAZOLE 500 MG PO TABS: 500.0000 mg | ORAL_TABLET | Freq: Two times a day (BID) | ORAL | 0 refills | Status: DC

## 2023-10-20 ENCOUNTER — Other Ambulatory Visit: Payer: Self-pay

## 2023-10-23 ENCOUNTER — Other Ambulatory Visit: Payer: Self-pay

## 2023-10-24 ENCOUNTER — Other Ambulatory Visit: Payer: Self-pay

## 2023-11-09 ENCOUNTER — Ambulatory Visit (HOSPITAL_COMMUNITY)
Admission: RE | Admit: 2023-11-09 | Discharge: 2023-11-09 | Disposition: A | Discharge status: Home / Self Care | Source: Ambulatory Visit | Attending: Internal Medicine | Admitting: Internal Medicine

## 2023-11-09 DIAGNOSIS — C719 Malignant neoplasm of brain, unspecified: Secondary | ICD-10-CM

## 2023-11-10 ENCOUNTER — Ambulatory Visit (HOSPITAL_COMMUNITY)
Admission: RE | Admit: 2023-11-10 | Discharge: 2023-11-10 | Disposition: A | Discharge status: Home / Self Care | Source: Ambulatory Visit | Attending: Family Medicine | Admitting: Family Medicine

## 2023-11-10 DIAGNOSIS — C719 Malignant neoplasm of brain, unspecified: Secondary | ICD-10-CM | POA: Insufficient documentation

## 2023-11-10 DIAGNOSIS — Z1231 Encounter for screening mammogram for malignant neoplasm of breast: Secondary | ICD-10-CM | POA: Diagnosis present

## 2023-11-11 ENCOUNTER — Ambulatory Visit (HOSPITAL_COMMUNITY)
Admission: RE | Admit: 2023-11-11 | Discharge: 2023-11-11 | Disposition: A | Discharge status: Home / Self Care | Source: Ambulatory Visit | Attending: Internal Medicine

## 2023-11-11 DIAGNOSIS — C719 Malignant neoplasm of brain, unspecified: Secondary | ICD-10-CM | POA: Diagnosis not present

## 2023-11-11 MED ORDER — GADOBUTROL 1 MMOL/ML IV SOLN
5.0000 mL | Freq: Once | INTRAVENOUS | Status: AC | PRN
  Administered 2023-11-11: 5 mL via INTRAVENOUS

## 2023-11-13 ENCOUNTER — Inpatient Hospital Stay (HOSPITAL_BASED_OUTPATIENT_CLINIC_OR_DEPARTMENT_OTHER): Admitting: Internal Medicine

## 2023-11-13 ENCOUNTER — Inpatient Hospital Stay: Attending: Internal Medicine

## 2023-11-13 VITALS — BP 119/75 | HR 76 | Temp 97.3°F | Resp 18 | Wt 107.1 lb

## 2023-11-13 DIAGNOSIS — Z9221 Personal history of antineoplastic chemotherapy: Secondary | ICD-10-CM | POA: Diagnosis not present

## 2023-11-13 DIAGNOSIS — C711 Malignant neoplasm of frontal lobe: Secondary | ICD-10-CM | POA: Insufficient documentation

## 2023-11-13 DIAGNOSIS — Z87891 Personal history of nicotine dependence: Secondary | ICD-10-CM | POA: Insufficient documentation

## 2023-11-13 DIAGNOSIS — C719 Malignant neoplasm of brain, unspecified: Secondary | ICD-10-CM | POA: Diagnosis not present

## 2023-11-13 DIAGNOSIS — Z923 Personal history of irradiation: Secondary | ICD-10-CM | POA: Insufficient documentation

## 2023-11-13 LAB — CMP (CANCER CENTER ONLY)
ALT: 13 U/L (ref 0–44)
AST: 15 U/L (ref 15–41)
Albumin: 4.5 g/dL (ref 3.5–5.0)
Alkaline Phosphatase: 52 U/L (ref 38–126)
Anion gap: 4 — ABNORMAL LOW (ref 5–15)
BUN: 9 mg/dL (ref 6–20)
CO2: 29 mmol/L (ref 22–32)
Calcium: 9.5 mg/dL (ref 8.9–10.3)
Chloride: 106 mmol/L (ref 98–111)
Creatinine: 0.74 mg/dL (ref 0.44–1.00)
GFR, Estimated: >60 mL/min (ref >60)
Glucose, Bld: 95 mg/dL (ref 70–99)
Potassium: 4.4 mmol/L (ref 3.5–5.1)
Sodium: 139 mmol/L (ref 135–145)
Total Bilirubin: 0.4 mg/dL (ref 0.0–1.2)
Total Protein: 7 g/dL (ref 6.5–8.1)

## 2023-11-13 LAB — CBC WITH DIFFERENTIAL (CANCER CENTER ONLY)
Abs Immature Granulocytes: 0.01 10*3/uL (ref 0.00–0.07)
Basophils Absolute: 0 10*3/uL (ref 0.0–0.1)
Basophils Relative: 1 %
Eosinophils Absolute: 0.1 10*3/uL (ref 0.0–0.5)
Eosinophils Relative: 2 %
HCT: 33.4 % — ABNORMAL LOW (ref 36.0–46.0)
Hemoglobin: 12 g/dL (ref 12.0–15.0)
Immature Granulocytes: 0 %
Lymphocytes Relative: 28 %
Lymphs Abs: 0.7 10*3/uL (ref 0.7–4.0)
MCH: 34.5 pg — ABNORMAL HIGH (ref 26.0–34.0)
MCHC: 35.9 g/dL (ref 30.0–36.0)
MCV: 96 fL (ref 80.0–100.0)
Monocytes Absolute: 0.3 10*3/uL (ref 0.1–1.0)
Monocytes Relative: 10 %
Neutro Abs: 1.5 10*3/uL — ABNORMAL LOW (ref 1.7–7.7)
Neutrophils Relative %: 59 %
Platelet Count: 168 10*3/uL (ref 150–400)
RBC: 3.48 MIL/uL — ABNORMAL LOW (ref 3.87–5.11)
RDW: 13.2 % (ref 11.5–15.5)
WBC Count: 2.6 10*3/uL — ABNORMAL LOW (ref 4.0–10.5)
nRBC: 0 % (ref 0.0–0.2)

## 2023-11-13 NOTE — Progress Notes (Signed)
 [facility] [address]  [place], [place] [address] [phone]   Interval Evaluation  Date of Service: 11/13/23 Patient Name: Maureen Key Patient MRN: [id number] Patient DOB: Dec 08, 1980 Provider: Mamie Searles, MD  Identifying Statement:  Maureen Key is a 43 y.o. 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 Ali Antonio.  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  IDH 1/2 Mutated.  1p/19q co-deleted  TERT Unknown   Interval History:  Maureen Key presents for follow up today after recent MRI brain.  She felt better this month holding off on the Temodar .  Migraines remain well controlled with propanolol.  Rarely needing breakthrough analegsia.  Otherwise maintaining activity level at home and work.    H+P (12/20/19) Patient presented to medical attention after trauma, fall while roller skating and fracturing tibia one week ago.  This led to trauma evaluation and CNS imaging which demonstrated left frontal mass ~5cm.  She denies any neurologic symptoms, no headaches or seizures.  Currently in cast and wheelchair following tibial fixation over the weekend, healing well.   Medications: Current Outpatient Medications on File Prior to Visit  Medication Sig Dispense Refill   Probiotic Product (PROBIOTIC DAILY PO) Take 1 tablet by mouth daily.     VITAMIN D  PO Take by mouth.     ondansetron  (ZOFRAN ) 8 MG tablet Take 1 tablet (8 mg total) by mouth every 8 (eight) hours as needed for nausea or vomiting. May take 30-60 minutes prior to Temodar   administration if nausea/vomiting occurs as needed. (Patient not taking: Reported on 11/13/2023) 30 tablet 1   No current facility-administered medications on file prior to visit.    Allergies: No Known Allergies Past Medical History:  Past Medical History:  Diagnosis Date   Anxiety 2008   Blood transfusion without reported diagnosis    last PPH delivery   History of placenta accreta in prior pregnancy, currently pregnant    History of sepsis    Oligodendroglioma (HCC)    Sickle cell trait (HCC)    Past Surgical History:  Past Surgical History:  Procedure Laterality Date   ABDOMINAL HYSTERECTOMY  05/12/2017   Procedure: HYSTERECTOMY ABDOMINAL;  Surgeon: Albino Hum, MD;  Location: [facility];  Service: Obstetrics;;   APPLICATION OF CRANIAL NAVIGATION N/A 02/04/2020   Procedure: APPLICATION OF CRANIAL NAVIGATION;  Surgeon: Cannon Champion, MD;  Location: [facility] OR;  Service: Neurosurgery;  Laterality: N/A;  APPLICATION OF CRANIAL NAVIGATION   BRAIN SURGERY  01/05/2020   CERVICAL CONE BIOPSY     CESAREAN SECTION     CESAREAN SECTION N/A 05/12/2017   Procedure: CESAREAN SECTION WITH Hysterectomy;  Surgeon: Albino Hum, MD;  Location: [facility];  Service: Obstetrics;  Laterality: N/A;   CRANIOTOMY Left 02/04/2020   Procedure: LEFT CRANIOTOMY FOR TUMOR RESECTION;  Surgeon: Cannon Champion, MD;  Location: [facility] OR;  Service: Neurosurgery;  Laterality: Left;  LEFT CRANIOTOMY FOR TUMOR RESECTION   TIBIA IM NAIL INSERTION Right 12/13/2019   Procedure: INTRAMEDULLARY (IM) NAIL TIBIAL;  Surgeon: Laneta Pintos, MD;  Location: [facility] OR;  Service:  Orthopedics;  Laterality: Right;   Social History:  Social History   Socioeconomic History   Marital status: Single    Spouse name: Not on file   Number of children: 4   Years of education: Not on file   Highest education level: 12th grade  Occupational History   Not on file  Tobacco Use   Smoking status: Former     Current packs/day: 0.00    Average packs/day: 0.3 packs/day for 15.0 years (3.8 ttl pk-yrs)    Types: Cigarettes    Start date: 03/08/2002    Quit date: 03/08/2017    Years since quitting: 6.6   Smokeless tobacco: Never   Tobacco comments:    3 cigarettes a day  Vaping Use   Vaping status: Never Used  Substance and Sexual Activity   Alcohol use: Not Currently    Alcohol/week: 1.0 standard drink of alcohol    Types: 1 Glasses of wine per week    Comment: On occasions   Drug use: Not Currently    Frequency: 2.0 times per week    Types: Marijuana    Comment: last used 04/09/17   Sexual activity: Yes    Birth control/protection: Condom, None  Other Topics Concern   Not on file  Social History Narrative   Not on file   Social Drivers of Health   Financial Resource Strain: Low Risk  (07/14/2023)   Overall Financial Resource Strain (CARDIA)    Difficulty of Paying Living Expenses: Not hard at all  Food Insecurity: No Food Insecurity (07/20/2023)   Hunger Vital Sign    Worried About Running Out of Food in the Last Year: Never true    Ran Out of Food in the Last Year: Never true  Transportation Needs: No Transportation Needs (07/20/2023)   PRAPARE - Administrator, Civil Service (Medical): No    Lack of Transportation (Non-Medical): No  Physical Activity: Inactive (07/14/2023)   Exercise Vital Sign    Days of Exercise per Week: 0 days    Minutes of Exercise per Session: 0 min  Stress: No Stress Concern Present (07/14/2023)   [employer] of Occupational Health - Occupational Stress Questionnaire    Feeling of Stress : Not at all  Social Connections: Moderately Isolated (07/14/2023)   Social Connection and Isolation Panel [NHANES]    Frequency of Communication with Friends and Family: More than three times a week    Frequency of Social Gatherings with Friends and Family: Once a week    Attends Religious Services: More than 4 times per year    Active Member of [employer] or  Organizations: No    Attends Banker Meetings: Never    Marital Status: Never married  Intimate Partner Violence: Not At Risk (07/20/2023)   Humiliation, Afraid, Rape, and Kick questionnaire    Fear of Current or Ex-Partner: No    Emotionally Abused: No    Physically Abused: No    Sexually Abused: No   Family History:  Family History  Problem Relation Age of Onset   Hypertension Mother    HIV/AIDS Father    Cancer Father        lung   Diabetes Sister    Diabetes Maternal Aunt    Diabetes Maternal Uncle    Cancer Paternal Aunt    Cancer Paternal Uncle    Diabetes Maternal Grandmother    Alzheimer's disease Paternal Grandmother     Review of Systems: Constitutional: Doesn't report fevers, chills  or abnormal weight loss Eyes: Doesn't report blurriness of vision Ears, nose, mouth, throat, and face: Doesn't report sore throat Respiratory: Doesn't report cough, dyspnea or wheezes Cardiovascular: Doesn't report palpitation, chest discomfort  Gastrointestinal:  Doesn't report nausea, constipation, diarrhea GU: Doesn't report incontinence Skin: Doesn't report skin rashes Neurological: Per HPI Musculoskeletal: Doesn't report joint pain Behavioral/Psych: Doesn't report anxiety  Physical Exam: Vitals:   11/13/23 1057  BP: 119/75  Pulse: 76  Resp: 18  Temp: (!) 97.3 F (36.3 C)  SpO2: 100%    KPS: 80. General: Alert, cooperative, pleasant, in no acute distress Head: Normal EENT: No conjunctival injection or scleral icterus.  Lungs: Resp effort normal Cardiac: Regular rate Abdomen: Non-distended abdomen Skin: No rashes cyanosis or petechiae. Extremities: No clubbing or edema  Neurologic Exam: Mental Status: Awake, alert, attentive to examiner. Oriented to self and environment. Language is fluent with intact comprehension.  Impaired recall, age advanced psychomotor slowing Cranial Nerves: Visual acuity is grossly normal. Visual fields are full. Extra-ocular  movements intact. No ptosis. Face is symmetric Motor: Tone and bulk are normal. Power is full in both arms and legs. Reflexes are symmetric, no pathologic reflexes present.  Sensory: Intact to light touch Gait: Orthopedic limitation only  Labs: I have reviewed the data as listed    Component Value Date/Time   NA 139 11/13/2023 1019   NA 143 01/03/2018 0949   K 4.4 11/13/2023 1019   CL 106 11/13/2023 1019   CO2 29 11/13/2023 1019   GLUCOSE 95 11/13/2023 1019   BUN 9 11/13/2023 1019   BUN 14 01/03/2018 0949   CREATININE 0.74 11/13/2023 1019   CALCIUM 9.5 11/13/2023 1019   PROT 7.0 11/13/2023 1019   PROT 7.6 01/03/2018 0949   ALBUMIN 4.5 11/13/2023 1019   ALBUMIN 4.8 01/03/2018 0949   AST 15 11/13/2023 1019   ALT 13 11/13/2023 1019   ALKPHOS 52 11/13/2023 1019   BILITOT 0.4 11/13/2023 1019   GFRNONAA >60 11/13/2023 1019   GFRAA >60 01/31/2020 1340   Lab Results  Component Value Date   WBC 2.6 (L) 11/13/2023   NEUTROABS 1.5 (L) 11/13/2023   HGB 12.0 11/13/2023   HCT 33.4 (L) 11/13/2023   MCV 96.0 11/13/2023   PLT 168 11/13/2023    Imaging:  [facility] Clinician Interpretation: I have personally reviewed the CNS images as listed.  My interpretation, in the context of the patient's clinical presentation, is stable disease  MR BRAIN W WO CONTRAST Result Date: 11/13/2023 CLINICAL DATA:  43 year old female with oligodendroglioma, IDH mutant. Restaging. EXAM: MRI HEAD WITHOUT AND WITH CONTRAST TECHNIQUE: Multiplanar, multiecho pulse sequences of the brain and surrounding structures were obtained without and with intravenous contrast. CONTRAST:  5mL GADAVIST  GADOBUTROL  1 MMOL/ML IV SOLN COMPARISON:  08/16/2023 and earlier. FINDINGS: Brain: Previous left vertex craniotomy. Underlying resection cavity, regional T2 and FLAIR hyperintensity. Stable size and configuration since February, minimally increased T2 and FLAIR hyperintensity along the posterior margin of the treatment site compared to  September last year (e.g. Series 15, image 36). No involvement of the nearby corpus callosum. Following contrast no progressed or suspicious enhancement when compared to September. Mild curvilinear vascular related enhancement is stable. No suspicious changes on DWI. No regional mass effect. Stable to decreased mild regional hemosiderin. No new signal abnormality identified elsewhere. No restricted diffusion to suggest acute infarction. No midline shift, ventriculomegaly, or acute intracranial hemorrhage. Cervicomedullary junction and pituitary are within normal limits. No suspicious dural thickening. No new intracranial enhancement. Vascular:  Major intracranial vascular flow voids are stable. Mildly dominant distal left vertebral artery. Following contrast the major dural venous sinuses are enhancing and appear patent. Skull and upper cervical spine: Stable craniotomy. Normal background bone marrow signal. Negative visible cervical spine and spinal cord. Sinuses/Orbits: Negative. Other: Mastoids are clear. Visible internal auditory structures appear normal. Stable visible scalp and face. IMPRESSION: 1. Stable since November, satisfactory post treatment appearance of the Brain. No abnormal enhancement or suspicious features at the treatment site. 2. No new intracranial abnormality. Electronically Signed   By: Marlise Simpers M.D.   On: 11/13/2023 09:04    Assessment/Plan Oligodendroglioma, IDH gene mutant and 1p/19q-codeleted (HCC)  Keishawn A Atha is clinically stable today, now having completed cycle #3 of 5-day Temodar , s/p 1 month holiday and MRI brain.  MRI demonstrates stable findings.  Neurtopenia is improved today, but ANC still below normal at 1.5. despite Temodar  dose reduction.    We recommended holding off on chemotherapy again this month and repeating MRI brain in 2 months.  Chemotherapy should be held for the following:  ANC less than 1,000  Platelets less than 100,000  LFT or  creatinine greater than 2x ULN  If clinical concerns/contraindications develop  Recommended continuing propanolol 40mg  BID for headache prevention.    We ask that Yolandra A Carucci return to clinic in 2 months with MRI brain prior to (potential) cycle #4, or sooner as needed.   All questions were answered. The patient knows to call the clinic with any problems, questions or concerns. No barriers to learning were detected.  I have spent a total of 40 minutes of face-to-face and non-face-to-face time, excluding clinical staff time, preparing to see patient, ordering tests and/or medications, counseling the patient, and independently interpreting results and communicating results to the patient/family/caregiver    Armanda Forand K Benecio Kluger, MD Medical Director of Neuro-Oncology [facility] 11/13/23 11:21 AM

## 2023-11-15 ENCOUNTER — Telehealth: Payer: Self-pay | Admitting: Internal Medicine

## 2023-11-15 NOTE — Telephone Encounter (Signed)
 Patient scheduled appointments. Patient is aware of all appointment details.

## 2023-12-28 ENCOUNTER — Other Ambulatory Visit: Payer: Self-pay

## 2024-01-01 ENCOUNTER — Other Ambulatory Visit: Payer: Self-pay

## 2024-01-08 ENCOUNTER — Other Ambulatory Visit: Payer: Self-pay

## 2024-01-11 ENCOUNTER — Ambulatory Visit (HOSPITAL_COMMUNITY)
Admission: RE | Admit: 2024-01-11 | Discharge: 2024-01-11 | Disposition: A | Discharge status: Home / Self Care | Source: Ambulatory Visit | Attending: Internal Medicine | Admitting: Internal Medicine

## 2024-01-11 DIAGNOSIS — C719 Malignant neoplasm of brain, unspecified: Secondary | ICD-10-CM | POA: Diagnosis present

## 2024-01-11 MED ORDER — GADOBUTROL 1 MMOL/ML IV SOLN
5.0000 mL | Freq: Once | INTRAVENOUS | Status: AC | PRN
  Administered 2024-01-11: 5 mL via INTRAVENOUS

## 2024-01-15 ENCOUNTER — Inpatient Hospital Stay (HOSPITAL_BASED_OUTPATIENT_CLINIC_OR_DEPARTMENT_OTHER): Admitting: Internal Medicine

## 2024-01-15 ENCOUNTER — Inpatient Hospital Stay: Attending: Internal Medicine

## 2024-01-15 ENCOUNTER — Other Ambulatory Visit: Payer: Self-pay

## 2024-01-15 VITALS — BP 127/95 | HR 85 | Temp 97.2°F | Resp 14 | Wt 105.7 lb

## 2024-01-15 DIAGNOSIS — C711 Malignant neoplasm of frontal lobe: Secondary | ICD-10-CM | POA: Insufficient documentation

## 2024-01-15 DIAGNOSIS — C719 Malignant neoplasm of brain, unspecified: Secondary | ICD-10-CM

## 2024-01-15 LAB — CMP (CANCER CENTER ONLY)
ALT: 11 U/L (ref 0–44)
AST: 14 U/L — ABNORMAL LOW (ref 15–41)
Albumin: 4.4 g/dL (ref 3.5–5.0)
Alkaline Phosphatase: 56 U/L (ref 38–126)
Anion gap: 7 (ref 5–15)
BUN: 9 mg/dL (ref 6–20)
CO2: 27 mmol/L (ref 22–32)
Calcium: 9.6 mg/dL (ref 8.9–10.3)
Chloride: 107 mmol/L (ref 98–111)
Creatinine: 0.74 mg/dL (ref 0.44–1.00)
GFR, Estimated: >60 mL/min (ref >60)
Glucose, Bld: 50 mg/dL — ABNORMAL LOW (ref 70–99)
Potassium: 3.5 mmol/L (ref 3.5–5.1)
Sodium: 141 mmol/L (ref 135–145)
Total Bilirubin: 0.5 mg/dL (ref 0.0–1.2)
Total Protein: 7.1 g/dL (ref 6.5–8.1)

## 2024-01-15 LAB — CBC WITH DIFFERENTIAL (CANCER CENTER ONLY)
Abs Immature Granulocytes: 0 K/uL (ref 0.00–0.07)
Basophils Absolute: 0 K/uL (ref 0.0–0.1)
Basophils Relative: 0 %
Eosinophils Absolute: 0.1 K/uL (ref 0.0–0.5)
Eosinophils Relative: 3 %
HCT: 37.3 % (ref 36.0–46.0)
Hemoglobin: 13.4 g/dL (ref 12.0–15.0)
Immature Granulocytes: 0 %
Lymphocytes Relative: 30 %
Lymphs Abs: 0.8 K/uL (ref 0.7–4.0)
MCH: 34.6 pg — ABNORMAL HIGH (ref 26.0–34.0)
MCHC: 35.9 g/dL (ref 30.0–36.0)
MCV: 96.4 fL (ref 80.0–100.0)
Monocytes Absolute: 0.3 K/uL (ref 0.1–1.0)
Monocytes Relative: 11 %
Neutro Abs: 1.5 K/uL — ABNORMAL LOW (ref 1.7–7.7)
Neutrophils Relative %: 56 %
Platelet Count: 183 K/uL (ref 150–400)
RBC: 3.87 MIL/uL (ref 3.87–5.11)
RDW: 11.5 % (ref 11.5–15.5)
WBC Count: 2.7 K/uL — ABNORMAL LOW (ref 4.0–10.5)
nRBC: 0 % (ref 0.0–0.2)

## 2024-01-15 NOTE — Progress Notes (Signed)
 [facility] at [facility] [address]  [place], [place] [address] [phone]   Interval Evaluation  Date of Service: 01/15/24 Patient Name: Maureen Key Patient MRN: [id number] Patient DOB: 18-Jul-1980 Provider: Arthea MARLA Manns, MD  Identifying Statement:  Maureen Key is a 43 y.o. 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 Cheryle.  Path demonstrates Oligodendroglioma WHO III; IDHmt and 1p/19q co-deleted   01/30/2023 - 03/16/2023 Radiation Therapy   Completes IMRT with concurrent Temodar  Valene)   06/27/2023 -  Chemotherapy   5-day Temozolomide  150-200mg /m2     Biomarkers:  MGMT Unknown.  IDH 1/2 Mutated.  1p/19q co-deleted  TERT Unknown   Interval History:  Hayley A Bittinger presents for follow up today after recent MRI brain.  No new or progressive changes.  Migraines remain well controlled with propanolol.  Rarely needing breakthrough analegsia.  Otherwise maintaining activity level at home and work.    H+P (12/20/19) Patient presented to medical attention after trauma, fall while roller skating and fracturing tibia one week ago.  This led to trauma evaluation and CNS imaging which demonstrated left frontal mass ~5cm.  She denies any neurologic symptoms, no headaches or seizures.  Currently in cast and wheelchair following tibial fixation over the weekend, healing well.   Medications: Current Outpatient Medications on File Prior to Visit  Medication Sig Dispense Refill   Probiotic Product (PROBIOTIC DAILY PO) Take 1 tablet by mouth daily.     VITAMIN D  PO Take by mouth.     ondansetron  (ZOFRAN ) 8 MG tablet Take 1 tablet (8 mg total) by mouth every 8 (eight) hours as needed for nausea or vomiting. May take 30-60 minutes prior to Temodar  administration if  nausea/vomiting occurs as needed. (Patient not taking: Reported on 01/15/2024) 30 tablet 1   No current facility-administered medications on file prior to visit.    Allergies: No Known Allergies Past Medical History:  Past Medical History:  Diagnosis Date   Anxiety 2008   Blood transfusion without reported diagnosis    last PPH delivery   History of placenta accreta in prior pregnancy, currently pregnant    History of sepsis    Oligodendroglioma (HCC)    Sickle cell trait (HCC)    Past Surgical History:  Past Surgical History:  Procedure Laterality Date   ABDOMINAL HYSTERECTOMY  05/12/2017   Procedure: HYSTERECTOMY ABDOMINAL;  Surgeon: Edsel Norleen GAILS, MD;  Location: [facility] SU[facility];  Service: Obstetrics;;   APPLICATION OF CRANIAL NAVIGATION N/A 02/04/2020   Procedure: APPLICATION OF CRANIAL NAVIGATION;  Surgeon: Cheryle Debby DELENA, MD;  Location: [facility] OR;  Service: Neurosurgery;  Laterality: N/A;  APPLICATION OF CRANIAL NAVIGATION   BRAIN SURGERY  01/05/2020   CERVICAL CONE BIOPSY     CESAREAN SECTION     CESAREAN SECTION N/A 05/12/2017   Procedure: CESAREAN SECTION WITH Hysterectomy;  Surgeon: Edsel Norleen GAILS, MD;  Location: [facility] BIRTHING SUITES;  Service: Obstetrics;  Laterality: N/A;   CRANIOTOMY Left 02/04/2020   Procedure: LEFT CRANIOTOMY FOR TUMOR RESECTION;  Surgeon: Cheryle Debby DELENA, MD;  Location: [facility] OR;  Service: Neurosurgery;  Laterality: Left;  LEFT CRANIOTOMY FOR TUMOR RESECTION   TIBIA IM NAIL INSERTION Right 12/13/2019   Procedure: INTRAMEDULLARY (IM) NAIL TIBIAL;  Surgeon: Kendal Franky SQUIBB, MD;  Location: [facility] OR;  Service: Orthopedics;  Laterality: Right;   Social  History:  Social History   Socioeconomic History   Marital status: Single    Spouse name: Not on file   Number of children: 4   Years of education: Not on file   Highest education level: 12th grade  Occupational History   Not on file  Tobacco Use   Smoking status: Former    Current packs/day:  0.00    Average packs/day: 0.3 packs/day for 15.0 years (3.8 ttl pk-yrs)    Types: Cigarettes    Start date: 03/08/2002    Quit date: 03/08/2017    Years since quitting: 6.8   Smokeless tobacco: Never   Tobacco comments:    3 cigarettes a day  Vaping Use   Vaping status: Never Used  Substance and Sexual Activity   Alcohol use: Not Currently    Alcohol/week: 1.0 standard drink of alcohol    Types: 1 Glasses of wine per week    Comment: On occasions   Drug use: Not Currently    Frequency: 2.0 times per week    Types: Marijuana    Comment: last used 04/09/17   Sexual activity: Yes    Birth control/protection: Condom, None  Other Topics Concern   Not on file  Social History Narrative   Not on file   Social Drivers of Health   Financial Resource Strain: Low Risk  (07/14/2023)   Overall Financial Resource Strain (CARDIA)    Difficulty of Paying Living Expenses: Not hard at all  Food Insecurity: No Food Insecurity (07/20/2023)   Hunger Vital Sign    Worried About Running Out of Food in the Last Year: Never true    Ran Out of Food in the Last Year: Never true  Transportation Needs: No Transportation Needs (07/20/2023)   PRAPARE - Administrator, Civil Service (Medical): No    Lack of Transportation (Non-Medical): No  Physical Activity: Inactive (07/14/2023)   Exercise Vital Sign    Days of Exercise per Week: 0 days    Minutes of Exercise per Session: 0 min  Stress: No Stress Concern Present (07/14/2023)   [employer] of Occupational Health - Occupational Stress Questionnaire    Feeling of Stress : Not at all  Social Connections: Moderately Isolated (07/14/2023)   Social Connection and Isolation Panel    Frequency of Communication with Friends and Family: More than three times a week    Frequency of Social Gatherings with Friends and Family: Once a week    Attends Religious Services: More than 4 times per year    Active Member of [employer] or Organizations: No    Attends  Banker Meetings: Never    Marital Status: Never married  Intimate Partner Violence: Not At Risk (07/20/2023)   Humiliation, Afraid, Rape, and Kick questionnaire    Fear of Current or Ex-Partner: No    Emotionally Abused: No    Physically Abused: No    Sexually Abused: No   Family History:  Family History  Problem Relation Age of Onset   Hypertension Mother    HIV/AIDS Father    Cancer Father        lung   Diabetes Sister    Diabetes Maternal Aunt    Diabetes Maternal Uncle    Cancer Paternal Aunt    Cancer Paternal Uncle    Diabetes Maternal Grandmother    Alzheimer's disease Paternal Grandmother     Review of Systems: Constitutional: Doesn't report fevers, chills or abnormal weight loss Eyes: Doesn't report blurriness  of vision Ears, nose, mouth, throat, and face: Doesn't report sore throat Respiratory: Doesn't report cough, dyspnea or wheezes Cardiovascular: Doesn't report palpitation, chest discomfort  Gastrointestinal:  Doesn't report nausea, constipation, diarrhea GU: Doesn't report incontinence Skin: Doesn't report skin rashes Neurological: Per HPI Musculoskeletal: Doesn't report joint pain Behavioral/Psych: Doesn't report anxiety  Physical Exam: Vitals:   01/15/24 0940  BP: (!) 127/95  Pulse: 85  Resp: 14  Temp: (!) 97.2 F (36.2 C)  SpO2: 100%    KPS: 80. General: Alert, cooperative, pleasant, in no acute distress Head: Normal EENT: No conjunctival injection or scleral icterus.  Lungs: Resp effort normal Cardiac: Regular rate Abdomen: Non-distended abdomen Skin: No rashes cyanosis or petechiae. Extremities: No clubbing or edema  Neurologic Exam: Mental Status: Awake, alert, attentive to examiner. Oriented to self and environment. Language is fluent with intact comprehension.  Impaired recall, age advanced psychomotor slowing Cranial Nerves: Visual acuity is grossly normal. Visual fields are full. Extra-ocular movements intact. No  ptosis. Face is symmetric Motor: Tone and bulk are normal. Power is full in both arms and legs. Reflexes are symmetric, no pathologic reflexes present.  Sensory: Intact to light touch Gait: Orthopedic limitation only  Labs: I have reviewed the data as listed    Component Value Date/Time   NA 141 01/15/2024 0922   NA 143 01/03/2018 0949   K 3.5 01/15/2024 0922   CL 107 01/15/2024 0922   CO2 27 01/15/2024 0922   GLUCOSE 50 (L) 01/15/2024 0922   BUN 9 01/15/2024 0922   BUN 14 01/03/2018 0949   CREATININE 0.74 01/15/2024 0922   CALCIUM 9.6 01/15/2024 0922   PROT 7.1 01/15/2024 0922   PROT 7.6 01/03/2018 0949   ALBUMIN 4.4 01/15/2024 0922   ALBUMIN 4.8 01/03/2018 0949   AST 14 (L) 01/15/2024 0922   ALT 11 01/15/2024 0922   ALKPHOS 56 01/15/2024 0922   BILITOT 0.5 01/15/2024 0922   GFRNONAA >60 01/15/2024 0922   GFRAA >60 01/31/2020 1340   Lab Results  Component Value Date   WBC 2.7 (L) 01/15/2024   NEUTROABS 1.5 (L) 01/15/2024   HGB 13.4 01/15/2024   HCT 37.3 01/15/2024   MCV 96.4 01/15/2024   PLT 183 01/15/2024    Imaging:  [facility] Clinician Interpretation: I have personally reviewed the CNS images as listed.  My interpretation, in the context of the patient's clinical presentation, is stable disease  MR BRAIN W WO CONTRAST Result Date: 01/11/2024 CLINICAL DATA:  42 year old female with oligodendroglioma, IDH mutant. Restaging. EXAM: MRI HEAD WITHOUT AND WITH CONTRAST TECHNIQUE: Multiplanar, multiecho pulse sequences of the brain and surrounding structures were obtained without and with intravenous contrast. CONTRAST:  5mL GADAVIST  GADOBUTROL  1 MMOL/ML IV SOLN COMPARISON:  Brain MRI 11/11/2023 and earlier. FINDINGS: Brain: Chronic left vertex craniotomy. Underlying resection cavity is stable and size and configuration. Surrounding T2 and FLAIR hyperintensity remains stable, not significantly changed from last year. No regional mass effect. Stable mild hemosiderin. Stable  architectural distortion and associated curvilinear probably vascular related enhancement in those areas following contrast. No suspicious DWI signal, no new signal abnormality. Stable underlying cerebral volume. No superimposed restricted diffusion to suggest acute infarction. No midline shift,, ventriculomegaly, acute intracranial hemorrhage. Cervicomedullary junction and pituitary are within normal limits. No new signal abnormality elsewhere in the brain. No abnormal enhancement or dural thickening elsewhere. Vascular: Major intracranial vascular flow voids are stable. Following contrast the major dural venous sinuses are enhancing and appear to be patent. Skull and upper cervical  spine: Stable craniotomy. Normal background bone marrow signal. Negative visible cervical spine and spinal cord. Sinuses/Orbits: Stable and negative. Other: None. IMPRESSION: Continued stable from last year, satisfactory post treatment appearance of the brain. No new intracranial abnormality. Electronically Signed   By: VEAR Hurst M.D.   On: 01/11/2024 11:27    Assessment/Plan Oligodendroglioma, IDH gene mutant and 1p/19q-codeleted (HCC) - Plan: MR BRAIN W WO CONTRAST  Shawnia A Digioia is clinically stable today.  MRI demonstrates stable findings.  Relative neutropenia persists today, ANC still below normal at 1.5. She is now over 3 months removed from last chemo dosing.  We recommended holding off on chemotherapy again this month and repeating MRI brain in 3 months.  With additional progression we would likely transition to IDH inhibitor.  She is agreeable with this.  Recommended continuing propanolol 40mg  BID for headache prevention.    We ask that Tristin A Zipper return to clinic in 3 months with MRI brain, or sooner as needed.   All questions were answered. The patient knows to call the clinic with any problems, questions or concerns. No barriers to learning were detected.  I have spent a total of 40  minutes of face-to-face and non-face-to-face time, excluding clinical staff time, preparing to see patient, ordering tests and/or medications, counseling the patient, and independently interpreting results and communicating results to the patient/family/caregiver    Bari Leib K Hason Ofarrell, MD Medical Director of Neuro-Oncology [facility] [place] [facility] 01/15/24 10:13 AM

## 2024-01-16 ENCOUNTER — Other Ambulatory Visit: Payer: Self-pay

## 2024-01-25 ENCOUNTER — Other Ambulatory Visit: Payer: Self-pay

## 2024-01-31 ENCOUNTER — Other Ambulatory Visit: Payer: Self-pay | Admitting: Medical Genetics

## 2024-02-05 ENCOUNTER — Other Ambulatory Visit (HOSPITAL_COMMUNITY)

## 2024-02-27 ENCOUNTER — Ambulatory Visit: Admitting: Women's Health

## 2024-02-27 ENCOUNTER — Other Ambulatory Visit (HOSPITAL_COMMUNITY)
Admission: RE | Admit: 2024-02-27 | Discharge: 2024-02-27 | Disposition: A | Discharge status: Home / Self Care | Source: Ambulatory Visit | Attending: Women's Health | Admitting: Women's Health

## 2024-02-27 ENCOUNTER — Ambulatory Visit: Admitting: Obstetrics & Gynecology

## 2024-02-27 ENCOUNTER — Encounter: Payer: Self-pay | Admitting: Women's Health

## 2024-02-27 VITALS — BP 125/85 | HR 75 | Ht 60.0 in | Wt 111.0 lb

## 2024-02-27 DIAGNOSIS — Z01419 Encounter for gynecological examination (general) (routine) without abnormal findings: Secondary | ICD-10-CM

## 2024-02-27 DIAGNOSIS — Z01411 Encounter for gynecological examination (general) (routine) with abnormal findings: Secondary | ICD-10-CM

## 2024-02-27 DIAGNOSIS — N898 Other specified noninflammatory disorders of vagina: Secondary | ICD-10-CM

## 2024-02-27 NOTE — Addendum Note (Signed)
 Addended by: BERNARDO ALAN CROME on: 02/27/2024 09:03 AM   Modules accepted: Orders

## 2024-02-27 NOTE — Progress Notes (Addendum)
 WELL-WOMAN EXAMINATION Patient name: Maureen Key MRN [id number]  Date of birth: October 12, 1980 Chief Complaint:   Gynecologic Exam  History of Present Illness:   Maureen Key is a 43 y.o. [phone] African-American female being seen today for a routine well-woman exam.  Current complaints: vaginal odor, no itching/irritation/discharge  PCP: Landy Stains      does not desire labs Patient's last menstrual period was 08/05/2016. The current method of family planning is status post hysterectomy for placenta accreta Last pap 01/10/23. Results were: NILM w/ HRHPV negative. H/O abnormal pap: yes 'long time ago' Last mammogram: 11/10/23. Results were: normal. Family h/o breast cancer: no Last colonoscopy: never. Results were: N/A. Family h/o colorectal cancer: no     02/27/2024    8:37 AM 07/14/2023    1:17 PM 03/07/2023    8:43 AM 01/17/2023    2:01 PM 01/10/2023    3:32 PM  Depression screen PHQ 2/9  Decreased Interest 1 0 3 1 1   Down, Depressed, Hopeless 0 0 2 1 0  PHQ - 2 Score 1 0 5 2 1   Altered sleeping 1  1  0  Tired, decreased energy 1  3  1   Change in appetite 1  0  0  Feeling bad or failure about yourself  0  0  0  Trouble concentrating 0  2  1  Moving slowly or fidgety/restless 0  0  0  Suicidal thoughts 0  0  0  PHQ-9 Score 4  11  3   Difficult doing work/chores   Very difficult          02/27/2024    8:37 AM 01/10/2023    3:32 PM 11/11/2021    9:27 AM  GAD 7 : Generalized Anxiety Score  Nervous, Anxious, on Edge 0 0 0  Control/stop worrying 0 0 0  Worry too much - different things 0 1 0  Trouble relaxing 0 0 0  Restless 0 0 0  Easily annoyed or irritable 0 0 0  Afraid - awful might happen 0 0 0  Total GAD 7 Score 0 1 0     Review of Systems:   Pertinent items are noted in HPI Denies any headaches, blurred vision, fatigue, shortness of breath, chest pain, abdominal pain, abnormal vaginal discharge/itching/odor/irritation, problems with periods, bowel  movements, urination, or intercourse unless otherwise stated above. Pertinent History Reviewed:  Reviewed past medical,surgical, social and family history.  Reviewed problem list, medications and allergies. Physical Assessment:   Vitals:   02/27/24 0836  BP: 125/85  Pulse: 75  Weight: 111 lb (50.3 kg)  Height: 5' (1.524 m)  Body mass index is 21.68 kg/m.        Physical Examination:   General appearance - well appearing, and in no distress  Mental status - alert, oriented to person, place, and time  Psych:  She has a normal mood and affect  Skin - warm and dry, normal color, no suspicious lesions noted  Chest - effort normal, all lung fields clear to auscultation bilaterally  Heart - normal rate and regular rhythm  Neck:  midline trachea, no thyromegaly or nodules  Breasts - breasts appear normal, no suspicious masses, no skin or nipple changes or  axillary nodes  Abdomen - soft, nontender, nondistended, no masses or organomegaly  Pelvic - VULVA: normal appearing vulva with no masses, tenderness or lesions  URETHRAL MEATUS-appears normal VAGINA: normal appearing vagina with normal color and discharge, no lesions  CERVIX: normal  appearing cervix without discharge or lesions, no CMT  Rectal- no evidence of external hemorrhoids  Thin prep pap is not done   UTERUS: uterus is felt to be normal size, shape, consistency and nontender   ADNEXA: No adnexal masses or tenderness noted.  Extremities:  No swelling or varicosities noted  Chaperone: Alan Fischer  No results found for this or any previous visit (from the past 24 hours).  Assessment & Plan:  1) Well-Woman Exam  2) Vaginal odor> CV swab  Labs/procedures today: CV swab  Mammogram: 89yr from last, or sooner if problems Colonoscopy: @ 43yo, or sooner if problems  No orders of the defined types were placed in this encounter.   Meds: No orders of the defined types were placed in this encounter.   Follow-up: Return in  about 1 year (around 02/26/2025) for Pap & physical.  Suzen JONELLE Fetters CNM, [facility] 02/27/2024 8:59 AM

## 2024-02-28 LAB — CERVICOVAGINAL ANCILLARY ONLY
Bacterial Vaginitis (gardnerella): POSITIVE — AB
Candida Glabrata: NEGATIVE
Candida Vaginitis: NEGATIVE
Chlamydia: NEGATIVE
Comment: NEGATIVE
Comment: NEGATIVE
Comment: NEGATIVE
Comment: NEGATIVE
Comment: NEGATIVE
Comment: NORMAL
Neisseria Gonorrhea: NEGATIVE
Trichomonas: NEGATIVE

## 2024-02-29 ENCOUNTER — Ambulatory Visit: Payer: Self-pay | Admitting: Women's Health

## 2024-02-29 MED ORDER — METRONIDAZOLE 500 MG PO TABS: 500.0000 mg | ORAL_TABLET | Freq: Two times a day (BID) | ORAL | 0 refills | Status: DC

## 2024-04-04 ENCOUNTER — Other Ambulatory Visit (HOSPITAL_COMMUNITY): Payer: Self-pay

## 2024-04-08 ENCOUNTER — Other Ambulatory Visit: Payer: Self-pay

## 2024-04-08 ENCOUNTER — Ambulatory Visit (HOSPITAL_COMMUNITY)
Admission: RE | Admit: 2024-04-08 | Discharge: 2024-04-08 | Disposition: A | Discharge status: Home / Self Care | Source: Ambulatory Visit | Attending: Internal Medicine | Admitting: Internal Medicine

## 2024-04-08 DIAGNOSIS — C719 Malignant neoplasm of brain, unspecified: Secondary | ICD-10-CM | POA: Diagnosis present

## 2024-04-08 MED ORDER — GADOBUTROL 1 MMOL/ML IV SOLN
5.0000 mL | Freq: Once | INTRAVENOUS | Status: AC | PRN
Start: 2024-04-08 — End: 2024-04-08
  Administered 2024-04-08: 5 mL via INTRAVENOUS

## 2024-04-15 ENCOUNTER — Inpatient Hospital Stay: Admitting: Internal Medicine

## 2024-04-15 ENCOUNTER — Other Ambulatory Visit: Payer: Self-pay

## 2024-04-15 ENCOUNTER — Inpatient Hospital Stay: Attending: Internal Medicine

## 2024-04-15 ENCOUNTER — Other Ambulatory Visit: Payer: Self-pay | Admitting: *Deleted

## 2024-04-15 VITALS — BP 121/87 | HR 71 | Temp 97.4°F | Resp 17 | Ht 60.0 in | Wt 112.0 lb

## 2024-04-15 DIAGNOSIS — Q9359 Other deletions of part of a chromosome: Secondary | ICD-10-CM | POA: Diagnosis not present

## 2024-04-15 DIAGNOSIS — Z801 Family history of malignant neoplasm of trachea, bronchus and lung: Secondary | ICD-10-CM | POA: Insufficient documentation

## 2024-04-15 DIAGNOSIS — C711 Malignant neoplasm of frontal lobe: Secondary | ICD-10-CM | POA: Insufficient documentation

## 2024-04-15 DIAGNOSIS — Z87891 Personal history of nicotine dependence: Secondary | ICD-10-CM | POA: Diagnosis not present

## 2024-04-15 DIAGNOSIS — C719 Malignant neoplasm of brain, unspecified: Secondary | ICD-10-CM

## 2024-04-15 DIAGNOSIS — Z809 Family history of malignant neoplasm, unspecified: Secondary | ICD-10-CM | POA: Insufficient documentation

## 2024-04-15 LAB — CBC WITH DIFFERENTIAL (CANCER CENTER ONLY)
Abs Immature Granulocytes: 0.01 K/uL (ref 0.00–0.07)
Basophils Absolute: 0 K/uL (ref 0.0–0.1)
Basophils Relative: 1 %
Eosinophils Absolute: 0.1 K/uL (ref 0.0–0.5)
Eosinophils Relative: 4 %
HCT: 36.2 % (ref 36.0–46.0)
Hemoglobin: 12.8 g/dL (ref 12.0–15.0)
Immature Granulocytes: 0 %
Lymphocytes Relative: 35 %
Lymphs Abs: 0.9 K/uL (ref 0.7–4.0)
MCH: 33.2 pg (ref 26.0–34.0)
MCHC: 35.4 g/dL (ref 30.0–36.0)
MCV: 94 fL (ref 80.0–100.0)
Monocytes Absolute: 0.2 K/uL (ref 0.1–1.0)
Monocytes Relative: 9 %
Neutro Abs: 1.3 K/uL — ABNORMAL LOW (ref 1.7–7.7)
Neutrophils Relative %: 51 %
Platelet Count: 179 K/uL (ref 150–400)
RBC: 3.85 MIL/uL — ABNORMAL LOW (ref 3.87–5.11)
RDW: 12 % (ref 11.5–15.5)
WBC Count: 2.6 K/uL — ABNORMAL LOW (ref 4.0–10.5)
nRBC: 0 % (ref 0.0–0.2)

## 2024-04-15 LAB — CMP (CANCER CENTER ONLY)
ALT: 40 U/L (ref 0–44)
AST: 29 U/L (ref 15–41)
Albumin: 4.7 g/dL (ref 3.5–5.0)
Alkaline Phosphatase: 65 U/L (ref 38–126)
Anion gap: 6 (ref 5–15)
BUN: 13 mg/dL (ref 6–20)
CO2: 28 mmol/L (ref 22–32)
Calcium: 10 mg/dL (ref 8.9–10.3)
Chloride: 105 mmol/L (ref 98–111)
Creatinine: 0.78 mg/dL (ref 0.44–1.00)
GFR, Estimated: >60 mL/min (ref >60)
Glucose, Bld: 90 mg/dL (ref 70–99)
Potassium: 4.2 mmol/L (ref 3.5–5.1)
Sodium: 139 mmol/L (ref 135–145)
Total Bilirubin: 0.4 mg/dL (ref 0.0–1.2)
Total Protein: 7.5 g/dL (ref 6.5–8.1)

## 2024-04-15 NOTE — Progress Notes (Signed)
 [facility] [address]  [place], [place] [address] [phone]   Interval Evaluation  Date of Service: 04/15/24 Patient Name: Maureen Key Patient MRN: [id number] Patient DOB: 08-28-1980 Provider: Arthea MARLA Manns, MD  Identifying Statement:  Maureen Key is a 43 y.o. 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 Cheryle.  Path demonstrates Oligodendroglioma WHO III; IDHmt and 1p/19q co-deleted   01/30/2023 - 03/16/2023 Radiation Therapy   Completes IMRT with concurrent Temodar  Valene)   06/27/2023 -  Chemotherapy   5-day Temozolomide  150-200mg /m2     Biomarkers:  MGMT Unknown.  IDH 1/2 Mutated.  1p/19q co-deleted  TERT Unknown   Interval History:  Maureen Key presents for follow up today after recent MRI brain.  Denies new or progressive neurologic symptoms today.  Migraines remain well controlled with propanolol.  Rarely needing breakthrough analegsia.  Otherwise maintaining activity level at home and work.    H+P (12/20/19) Patient presented to medical attention after trauma, fall while roller skating and fracturing tibia one week ago.  This led to trauma evaluation and CNS imaging which demonstrated left frontal mass ~5cm.  She denies any neurologic symptoms, no headaches or seizures.  Currently in cast and wheelchair following tibial fixation over the weekend, healing well.   Medications: Current Outpatient Medications on File Prior to Visit  Medication Sig Dispense Refill   metroNIDAZOLE  (FLAGYL ) 500 MG tablet Take 1 tablet (500 mg total) by mouth 2 (two) times daily. 14 tablet 0   ondansetron  (ZOFRAN ) 8 MG tablet Take 1 tablet (8 mg total) by mouth every 8 (eight) hours as needed for nausea or vomiting. May take 30-60 minutes prior to Temodar   administration if nausea/vomiting occurs as needed. (Patient not taking: Reported on 02/27/2024) 30 tablet 1   Probiotic Product (PROBIOTIC DAILY PO) Take 1 tablet by mouth daily.     VITAMIN D  PO Take by mouth.     No current facility-administered medications on file prior to visit.    Allergies: No Known Allergies Past Medical History:  Past Medical History:  Diagnosis Date   Anxiety 2008   Blood transfusion without reported diagnosis    last PPH delivery   History of placenta accreta in prior pregnancy, currently pregnant    History of sepsis    Oligodendroglioma (HCC)    Sickle cell trait    Past Surgical History:  Past Surgical History:  Procedure Laterality Date   ABDOMINAL HYSTERECTOMY  05/12/2017   Procedure: HYSTERECTOMY ABDOMINAL;  Surgeon: Edsel Norleen GAILS, MD;  Location: [facility] BIRTHING SUITES;  Service: Obstetrics;;   APPLICATION OF CRANIAL NAVIGATION N/A 02/04/2020   Procedure: APPLICATION OF CRANIAL NAVIGATION;  Surgeon: Cheryle Debby DELENA, MD;  Location: [facility] OR;  Service: Neurosurgery;  Laterality: N/A;  APPLICATION OF CRANIAL NAVIGATION   BRAIN SURGERY  01/05/2020   CERVICAL CONE BIOPSY     CESAREAN SECTION     CESAREAN SECTION N/A 05/12/2017   Procedure: CESAREAN SECTION WITH Hysterectomy;  Surgeon: Edsel Norleen GAILS, MD;  Location: [facility] BIRTHING SUITES;  Service: Obstetrics;  Laterality: N/A;   CRANIOTOMY Left 02/04/2020   Procedure: LEFT CRANIOTOMY FOR TUMOR RESECTION;  Surgeon: Cheryle Debby DELENA, MD;  Location: [facility] OR;  Service: Neurosurgery;  Laterality: Left;  LEFT CRANIOTOMY FOR TUMOR RESECTION   TIBIA IM NAIL INSERTION Right 12/13/2019   Procedure:  INTRAMEDULLARY (IM) NAIL TIBIAL;  Surgeon: Kendal Franky SQUIBB, MD;  Location: [facility] OR;  Service: Orthopedics;  Laterality: Right;   Social History:  Social History   Socioeconomic History   Marital status: Single    Spouse name: Not on file   Number of children: 4   Years of education: Not on file   Highest  education level: 12th grade  Occupational History   Not on file  Tobacco Use   Smoking status: Former    Current packs/day: 0.00    Average packs/day: 0.3 packs/day for 15.0 years (3.8 ttl pk-yrs)    Types: Cigarettes    Start date: 03/08/2002    Quit date: 03/08/2017    Years since quitting: 7.1   Smokeless tobacco: Never   Tobacco comments:    3 cigarettes a day  Vaping Use   Vaping status: Never Used  Substance and Sexual Activity   Alcohol use: Not Currently    Alcohol/week: 1.0 standard drink of alcohol    Types: 1 Glasses of wine per week    Comment: On occasions   Drug use: Not Currently    Frequency: 2.0 times per week    Types: Marijuana    Comment: last used 04/09/17   Sexual activity: Yes    Birth control/protection: Surgical  Other Topics Concern   Not on file  Social History Narrative   Not on file   Social Drivers of Health   Financial Resource Strain: Low Risk  (02/27/2024)   Overall Financial Resource Strain (CARDIA)    Difficulty of Paying Living Expenses: Not hard at all  Food Insecurity: No Food Insecurity (02/27/2024)   Hunger Vital Sign    Worried About Running Out of Food in the Last Year: Never true    Ran Out of Food in the Last Year: Never true  Transportation Needs: No Transportation Needs (02/27/2024)   PRAPARE - Administrator, Civil Service (Medical): No    Lack of Transportation (Non-Medical): No  Physical Activity: Insufficiently Active (02/27/2024)   Exercise Vital Sign    Days of Exercise per Week: 7 days    Minutes of Exercise per Session: 20 min  Stress: No Stress Concern Present (02/27/2024)   [employer] of Occupational Health - Occupational Stress Questionnaire    Feeling of Stress: Not at all  Social Connections: Moderately Isolated (02/27/2024)   Social Connection and Isolation Panel    Frequency of Communication with Friends and Family: More than three times a week    Frequency of Social Gatherings with Friends  and Family: Once a week    Attends Religious Services: More than 4 times per year    Active Member of [employer] or Organizations: No    Attends Banker Meetings: Never    Marital Status: Never married  Intimate Partner Violence: Not At Risk (02/27/2024)   Humiliation, Afraid, Rape, and Kick questionnaire    Fear of Current or Ex-Partner: No    Emotionally Abused: No    Physically Abused: No    Sexually Abused: No   Family History:  Family History  Problem Relation Age of Onset   Hypertension Mother    HIV/AIDS Father    Cancer Father        lung   Diabetes Sister    Diabetes Maternal Aunt    Diabetes Maternal Uncle    Cancer Paternal Aunt    Cancer Paternal Uncle    Diabetes Maternal Grandmother    Alzheimer's disease  Paternal Grandmother     Review of Systems: Constitutional: Doesn't report fevers, chills or abnormal weight loss Eyes: Doesn't report blurriness of vision Ears, nose, mouth, throat, and face: Doesn't report sore throat Respiratory: Doesn't report cough, dyspnea or wheezes Cardiovascular: Doesn't report palpitation, chest discomfort  Gastrointestinal:  Doesn't report nausea, constipation, diarrhea GU: Doesn't report incontinence Skin: Doesn't report skin rashes Neurological: Per HPI Musculoskeletal: Doesn't report joint pain Behavioral/Psych: Doesn't report anxiety  Physical Exam: There were no vitals filed for this visit. KPS: 80. General: Alert, cooperative, pleasant, in no acute distress Head: Normal EENT: No conjunctival injection or scleral icterus.  Lungs: Resp effort normal Cardiac: Regular rate Abdomen: Non-distended abdomen Skin: No rashes cyanosis or petechiae. Extremities: No clubbing or edema  Neurologic Exam: Mental Status: Awake, alert, attentive to examiner. Oriented to self and environment. Language is fluent with intact comprehension.  Impaired recall, age advanced psychomotor slowing Cranial Nerves: Visual acuity is  grossly normal. Visual fields are full. Extra-ocular movements intact. No ptosis. Face is symmetric Motor: Tone and bulk are normal. Power is full in both arms and legs. Reflexes are symmetric, no pathologic reflexes present.  Sensory: Intact to light touch Gait: Orthopedic limitation only  Labs: I have reviewed the data as listed    Component Value Date/Time   NA 141 01/15/2024 0922   NA 143 01/03/2018 0949   K 3.5 01/15/2024 0922   CL 107 01/15/2024 0922   CO2 27 01/15/2024 0922   GLUCOSE 50 (L) 01/15/2024 0922   BUN 9 01/15/2024 0922   BUN 14 01/03/2018 0949   CREATININE 0.74 01/15/2024 0922   CALCIUM 9.6 01/15/2024 0922   PROT 7.1 01/15/2024 0922   PROT 7.6 01/03/2018 0949   ALBUMIN 4.4 01/15/2024 0922   ALBUMIN 4.8 01/03/2018 0949   AST 14 (L) 01/15/2024 0922   ALT 11 01/15/2024 0922   ALKPHOS 56 01/15/2024 0922   BILITOT 0.5 01/15/2024 0922   GFRNONAA >60 01/15/2024 0922   GFRAA >60 01/31/2020 1340   Lab Results  Component Value Date   WBC 2.6 (L) 04/15/2024   NEUTROABS PENDING 04/15/2024   HGB 12.8 04/15/2024   HCT 36.2 04/15/2024   MCV 94.0 04/15/2024   PLT 179 04/15/2024    Imaging:  [facility] Clinician Interpretation: I have personally reviewed the CNS images as listed.  My interpretation, in the context of the patient's clinical presentation, is stable disease  MR BRAIN W WO CONTRAST Result Date: 04/08/2024 EXAM: MRI BRAIN WITHOUT AND WITH CONTRAST 04/08/2024 11:43:48 AM TECHNIQUE: Multiplanar multisequence MRI of the head/brain was performed without and with the administration of intravenous contrast. COMPARISON: MRI of the head dated 01/11/2024. CLINICAL HISTORY: Brain/CNS neoplasm, assess treatment response. Dx: Oligodendroglioma, IDH gene mutant and 1p/19q-codeleted (HCC) C71.9 (ICD-10-CM). FINDINGS: BRAIN AND VENTRICLES: No acute infarct. No acute intracranial hemorrhage. No mass effect or midline shift. No hydrocephalus. The sella is unremarkable. Normal flow  voids. No mass or abnormal enhancement. The patient is status post left frontal parietal craniotomy. There are postoperative encephalomalacia changes present and immediately within the left frontal lobe which appear unchanged in the interim. There is no evidence of residual or recurrent tumor. There is no adverse interval change in the appearance of the brain. ORBITS: No acute abnormality. SINUSES: No acute abnormality. BONES AND SOFT TISSUES: Normal bone marrow signal and enhancement. No acute soft tissue abnormality. IMPRESSION: 1. No evidence of residual or recurrent tumor. 2. Stable postoperative encephalomalacia in the left frontal lobe. Electronically signed by: Evalene  Hugh MD 04/08/2024 12:23 PM EDT RP Workstation: [id number]    Assessment/Plan Oligodendroglioma, IDH gene mutant and 1p/19q-codeleted (HCC)  Maureen Key is clinically stable today.  MRI demonstrates stable findings when compared to 3 prior studies.  Relative neutropenia persists today. She is now 6 months removed from last chemo dosing.  We recommended holding off on chemotherapy and repeating MRI brain in 3 months.  With additional progression we would likely transition to IDH inhibitor.  She is agreeable with this.  Recommended continuing propanolol 40mg  BID for headache prevention.    We ask that Maureen Key return to clinic in 3 months with MRI brain, or sooner as needed.   All questions were answered. The patient knows to call the clinic with any problems, questions or concerns. No barriers to learning were detected.  I have spent a total of 40 minutes of face-to-face and non-face-to-face time, excluding clinical staff time, preparing to see patient, ordering tests and/or medications, counseling the patient, and independently interpreting results and communicating results to the patient/family/caregiver    Maureen Nobile K Lakynn Halvorsen, MD Medical Director of Neuro-Oncology [facility] at  [facility] 04/15/24 10:16 AM

## 2024-04-15 NOTE — Progress Notes (Signed)
 Per OV on 10/6, pt has been off therapy for 6 months. Will plan for another MRI in 3 months and then decide on starting new therapy depending on progression. Disenrolled.

## 2024-04-19 ENCOUNTER — Other Ambulatory Visit: Payer: Self-pay | Admitting: Medical Genetics

## 2024-04-19 DIAGNOSIS — Z006 Encounter for examination for normal comparison and control in clinical research program: Secondary | ICD-10-CM

## 2024-05-06 ENCOUNTER — Telehealth: Payer: Self-pay | Admitting: Internal Medicine

## 2024-05-06 LAB — GENECONNECT MOLECULAR SCREEN: Genetic Analysis Overall Interpretation: NEGATIVE

## 2024-05-06 NOTE — Telephone Encounter (Signed)
 Scheduled patient for next appointment. Called and spoke with her, she is aware.

## 2024-05-07 ENCOUNTER — Other Ambulatory Visit: Payer: Self-pay

## 2024-05-17 ENCOUNTER — Encounter: Payer: Self-pay | Admitting: Internal Medicine

## 2024-05-20 ENCOUNTER — Other Ambulatory Visit: Payer: Self-pay | Admitting: *Deleted

## 2024-05-20 DIAGNOSIS — Z1509 Genetic susceptibility to other malignant neoplasm: Secondary | ICD-10-CM

## 2024-05-20 MED ORDER — PROPRANOLOL HCL 40 MG PO TABS
40.0000 mg | ORAL_TABLET | Freq: Two times a day (BID) | ORAL | 0 refills | Status: AC
Start: 2024-05-20 — End: ?

## 2024-05-31 ENCOUNTER — Other Ambulatory Visit: Payer: Self-pay

## 2024-07-15 ENCOUNTER — Ambulatory Visit: Payer: Medicare Other

## 2024-07-16 ENCOUNTER — Ambulatory Visit

## 2024-07-17 ENCOUNTER — Ambulatory Visit (HOSPITAL_COMMUNITY)
Admission: RE | Admit: 2024-07-17 | Discharge: 2024-07-17 | Disposition: A | Discharge status: Home / Self Care | Source: Ambulatory Visit | Attending: Internal Medicine | Admitting: Internal Medicine

## 2024-07-17 ENCOUNTER — Encounter: Payer: Self-pay | Admitting: Internal Medicine

## 2024-07-17 DIAGNOSIS — Q9359 Other deletions of part of a chromosome: Secondary | ICD-10-CM | POA: Insufficient documentation

## 2024-07-17 DIAGNOSIS — C719 Malignant neoplasm of brain, unspecified: Secondary | ICD-10-CM | POA: Diagnosis present

## 2024-07-17 DIAGNOSIS — Z1509 Genetic susceptibility to other malignant neoplasm: Secondary | ICD-10-CM | POA: Diagnosis present

## 2024-07-17 MED ORDER — GADOBUTROL 1 MMOL/ML IV SOLN
5.0000 mL | Freq: Once | INTRAVENOUS | Status: AC | PRN
  Administered 2024-07-17: 5 mL via INTRAVENOUS

## 2024-07-18 ENCOUNTER — Encounter: Payer: Self-pay | Admitting: Internal Medicine

## 2024-07-23 ENCOUNTER — Inpatient Hospital Stay: Admitting: Internal Medicine

## 2024-07-23 ENCOUNTER — Inpatient Hospital Stay: Attending: Internal Medicine

## 2024-07-23 VITALS — BP 119/84 | HR 73 | Temp 98.0°F | Resp 17 | Ht 60.0 in | Wt 117.0 lb

## 2024-07-23 DIAGNOSIS — Q9359 Other deletions of part of a chromosome: Secondary | ICD-10-CM | POA: Diagnosis not present

## 2024-07-23 DIAGNOSIS — C719 Malignant neoplasm of brain, unspecified: Secondary | ICD-10-CM

## 2024-07-23 LAB — CBC WITH DIFFERENTIAL (CANCER CENTER ONLY)
Abs Immature Granulocytes: 0.01 K/uL (ref 0.00–0.07)
Basophils Absolute: 0 K/uL (ref 0.0–0.1)
Basophils Relative: 1 %
Eosinophils Absolute: 0.1 K/uL (ref 0.0–0.5)
Eosinophils Relative: 2 %
HCT: 36.9 % (ref 36.0–46.0)
Hemoglobin: 13 g/dL (ref 12.0–15.0)
Immature Granulocytes: 0 %
Lymphocytes Relative: 29 %
Lymphs Abs: 1 K/uL (ref 0.7–4.0)
MCH: 32.7 pg (ref 26.0–34.0)
MCHC: 35.2 g/dL (ref 30.0–36.0)
MCV: 92.9 fL (ref 80.0–100.0)
Monocytes Absolute: 0.4 K/uL (ref 0.1–1.0)
Monocytes Relative: 10 %
Neutro Abs: 2 K/uL (ref 1.7–7.7)
Neutrophils Relative %: 58 %
Platelet Count: 191 K/uL (ref 150–400)
RBC: 3.97 MIL/uL (ref 3.87–5.11)
RDW: 11.9 % (ref 11.5–15.5)
WBC Count: 3.4 K/uL — ABNORMAL LOW (ref 4.0–10.5)
nRBC: 0 % (ref 0.0–0.2)

## 2024-07-23 NOTE — Progress Notes (Signed)
 "  [facility] [address]  [place], [place] [address] [phone]   Interval Evaluation  Date of Service: 07/23/2024 Patient Name: Maureen Key Patient MRN: [id number] Patient DOB: 25-Feb-1981 Provider: Arthea MARLA Manns, MD  Identifying Statement:  Maureen Key is a 44 y.o. 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 Cheryle.  Path demonstrates Oligodendroglioma WHO III; IDHmt and 1p/19q co-deleted   01/30/2023 - 03/16/2023 Radiation Therapy   Completes IMRT with concurrent Temodar  Valene)   06/27/2023 - 10/09/2023 Chemotherapy   3 cycles 5-day Temozolomide  150-200mg /m2; held due to neutropenia     Biomarkers:  MGMT Unknown.  IDH 1/2 Mutated.  1p/19q co-deleted  TERT Unknown   Interval History:  Maureen Key presents for follow up today after recent MRI brain.  No new complaints today.  Migraines remain sporadic, continues on propanolol.  Rarely needing breakthrough analegsia.  Otherwise maintaining activity level at home and work.    H+P (12/20/19) Patient presented to medical attention after trauma, fall while roller skating and fracturing tibia one week ago.  This led to trauma evaluation and CNS imaging which demonstrated left frontal mass ~5cm.  She denies any neurologic symptoms, no headaches or seizures.  Currently in cast and wheelchair following tibial fixation over the weekend, healing well.   Medications: Current Outpatient Medications on File Prior to Visit  Medication Sig Dispense Refill   metroNIDAZOLE  (FLAGYL ) 500 MG tablet Take 1 tablet (500 mg total) by mouth 2 (two) times daily. 14 tablet 0   ondansetron  (ZOFRAN ) 8 MG tablet Take 1 tablet (8 mg total) by mouth every 8 (eight) hours as needed for nausea or vomiting. May take 30-60 minutes  prior to Temodar  administration if nausea/vomiting occurs as needed. (Patient not taking: Reported on 02/27/2024) 30 tablet 1   Probiotic Product (PROBIOTIC DAILY PO) Take 1 tablet by mouth daily.     propranolol  (INDERAL ) 40 MG tablet Take 1 tablet (40 mg total) by mouth 2 (two) times daily. For headache prevention 60 tablet 0   VITAMIN D  PO Take by mouth.     No current facility-administered medications on file prior to visit.    Allergies: No Known Allergies Past Medical History:  Past Medical History:  Diagnosis Date   Anxiety 2008   Blood transfusion without reported diagnosis    last PPH delivery   History of placenta accreta in prior pregnancy, currently pregnant    History of sepsis    Oligodendroglioma (HCC)    Sickle cell trait    Past Surgical History:  Past Surgical History:  Procedure Laterality Date   ABDOMINAL HYSTERECTOMY  05/12/2017   Procedure: HYSTERECTOMY ABDOMINAL;  Surgeon: Edsel Norleen GAILS, MD;  Location: [facility] BIRTHING SUITES;  Service: Obstetrics;;   APPLICATION OF CRANIAL NAVIGATION N/A 02/04/2020   Procedure: APPLICATION OF CRANIAL NAVIGATION;  Surgeon: Cheryle Debby DELENA, MD;  Location: [facility] OR;  Service: Neurosurgery;  Laterality: N/A;  APPLICATION OF CRANIAL NAVIGATION   BRAIN SURGERY  01/05/2020   CERVICAL CONE BIOPSY     CESAREAN SECTION     CESAREAN SECTION N/A 05/12/2017   Procedure: CESAREAN SECTION WITH Hysterectomy;  Surgeon: Edsel Norleen GAILS, MD;  Location: [facility] BIRTHING SUITES;  Service: Obstetrics;  Laterality: N/A;   CRANIOTOMY Left 02/04/2020   Procedure: LEFT CRANIOTOMY FOR TUMOR RESECTION;  Surgeon: Cheryle Debby  A, MD;  Location: [facility] OR;  Service: Neurosurgery;  Laterality: Left;  LEFT CRANIOTOMY FOR TUMOR RESECTION   TIBIA IM NAIL INSERTION Right 12/13/2019   Procedure: INTRAMEDULLARY (IM) NAIL TIBIAL;  Surgeon: Kendal Franky SQUIBB, MD;  Location: [facility] OR;  Service: Orthopedics;  Laterality: Right;   Social History:  Social History    Socioeconomic History   Marital status: Single    Spouse name: Not on file   Number of children: 4   Years of education: Not on file   Highest education level: 12th grade  Occupational History   Not on file  Tobacco Use   Smoking status: Former    Current packs/day: 0.00    Average packs/day: 0.3 packs/day for 15.0 years (3.8 ttl pk-yrs)    Types: Cigarettes    Start date: 03/08/2002    Quit date: 03/08/2017    Years since quitting: 7.3   Smokeless tobacco: Never   Tobacco comments:    3 cigarettes a day  Vaping Use   Vaping status: Never Used  Substance and Sexual Activity   Alcohol use: Not Currently    Alcohol/week: 1.0 standard drink of alcohol    Types: 1 Glasses of wine per week    Comment: On occasions   Drug use: Not Currently    Frequency: 2.0 times per week    Types: Marijuana    Comment: last used 04/09/17   Sexual activity: Yes    Birth control/protection: Surgical  Other Topics Concern   Not on file  Social History Narrative   Not on file   Social Drivers of Health   Tobacco Use: Medium Risk (02/27/2024)   Patient History    Smoking Tobacco Use: Former    Smokeless Tobacco Use: Never    Passive Exposure: Not on Actuary Strain: Low Risk (02/27/2024)   Overall Financial Resource Strain (CARDIA)    Difficulty of Paying Living Expenses: Not hard at all  Food Insecurity: No Food Insecurity (02/27/2024)   Epic    Worried About Radiation Protection Practitioner of Food in the Last Year: Never true    [employer] of Food in the Last Year: Never true  Transportation Needs: No Transportation Needs (02/27/2024)   Epic    Lack of Transportation (Medical): No    Lack of Transportation (Non-Medical): No  Physical Activity: Insufficiently Active (02/27/2024)   Exercise Vital Sign    Days of Exercise per Week: 7 days    Minutes of Exercise per Session: 20 min  Stress: No Stress Concern Present (02/27/2024)   [employer] of Occupational Health - Occupational Stress  Questionnaire    Feeling of Stress: Not at all  Social Connections: Moderately Isolated (02/27/2024)   Social Connection and Isolation Panel    Frequency of Communication with Friends and Family: More than three times a week    Frequency of Social Gatherings with Friends and Family: Once a week    Attends Religious Services: More than 4 times per year    Active Member of [employer] or Organizations: No    Attends Banker Meetings: Never    Marital Status: Never married  Intimate Partner Violence: Not At Risk (02/27/2024)   Epic    Fear of Current or Ex-Partner: No    Emotionally Abused: No    Physically Abused: No    Sexually Abused: No  Depression (PHQ2-9): Low Risk (02/27/2024)   Depression (PHQ2-9)    PHQ-2 Score: 4  Alcohol Screen: Low Risk (02/27/2024)  Alcohol Screen    Last Alcohol Screening Score (AUDIT): 1  Housing: Low Risk (02/27/2024)   Epic    Unable to Pay for Housing in the Last Year: No    Number of Times Moved in the Last Year: 0    Homeless in the Last Year: No  Utilities: Not At Risk (02/27/2024)   Epic    Threatened with loss of utilities: No  Health Literacy: Adequate Health Literacy (02/27/2024)   B1300 Health Literacy    Frequency of need for help with medical instructions: Never   Family History:  Family History  Problem Relation Age of Onset   Hypertension Mother    HIV/AIDS Father    Cancer Father        lung   Diabetes Sister    Diabetes Maternal Aunt    Diabetes Maternal Uncle    Cancer Paternal Aunt    Cancer Paternal Uncle    Diabetes Maternal Grandmother    Alzheimer's disease Paternal Grandmother     Review of Systems: Constitutional: Doesn't report fevers, chills or abnormal weight loss Eyes: Doesn't report blurriness of vision Ears, nose, mouth, throat, and face: Doesn't report sore throat Respiratory: Doesn't report cough, dyspnea or wheezes Cardiovascular: Doesn't report palpitation, chest discomfort  Gastrointestinal:   Doesn't report nausea, constipation, diarrhea GU: Doesn't report incontinence Skin: Doesn't report skin rashes Neurological: Per HPI Musculoskeletal: Doesn't report joint pain Behavioral/Psych: Doesn't report anxiety  Physical Exam: There were no vitals filed for this visit. KPS: 80. General: Alert, cooperative, pleasant, in no acute distress Head: Normal EENT: No conjunctival injection or scleral icterus.  Lungs: Resp effort normal Cardiac: Regular rate Abdomen: Non-distended abdomen Skin: No rashes cyanosis or petechiae. Extremities: No clubbing or edema  Neurologic Exam: Mental Status: Awake, alert, attentive to examiner. Oriented to self and environment. Language is fluent with intact comprehension.  Impaired recall, age advanced psychomotor slowing Cranial Nerves: Visual acuity is grossly normal. Visual fields are full. Extra-ocular movements intact. No ptosis. Face is symmetric Motor: Tone and bulk are normal. Power is full in both arms and legs. Reflexes are symmetric, no pathologic reflexes present.  Sensory: Intact to light touch Gait: Orthopedic limitation only  Labs: I have reviewed the data as listed    Component Value Date/Time   NA 139 04/15/2024 1004   NA 143 01/03/2018 0949   K 4.2 04/15/2024 1004   CL 105 04/15/2024 1004   CO2 28 04/15/2024 1004   GLUCOSE 90 04/15/2024 1004   BUN 13 04/15/2024 1004   BUN 14 01/03/2018 0949   CREATININE 0.78 04/15/2024 1004   CALCIUM 10.0 04/15/2024 1004   PROT 7.5 04/15/2024 1004   PROT 7.6 01/03/2018 0949   ALBUMIN 4.7 04/15/2024 1004   ALBUMIN 4.8 01/03/2018 0949   AST 29 04/15/2024 1004   ALT 40 04/15/2024 1004   ALKPHOS 65 04/15/2024 1004   BILITOT 0.4 04/15/2024 1004   GFRNONAA >60 04/15/2024 1004   GFRAA >60 01/31/2020 1340   Lab Results  Component Value Date   WBC 3.4 (L) 07/23/2024   NEUTROABS 2.0 07/23/2024   HGB 13.0 07/23/2024   HCT 36.9 07/23/2024   MCV 92.9 07/23/2024   PLT 191 07/23/2024     Imaging:  [facility] Clinician Interpretation: I have personally reviewed the CNS images as listed.  My interpretation, in the context of the patient's clinical presentation, is stable disease  MR BRAIN W WO CONTRAST Result Date: 07/17/2024 EXAM: MRI BRAIN WITH AND WITHOUT CONTRAST 07/17/2024 11:00:43  AM TECHNIQUE: Multiplanar multisequence MRI of the head/brain was performed with and without the administration of intravenous contrast. CONTRAST: 5 mL of Gadavist . COMPARISON: MR Head without then with IV contrast 04/08/2024; 01/11/2024. CLINICAL HISTORY: 44 year old female. IDH mutant oligodendroglioma, restaging. Brain/CNS neoplasm, assess treatment response. FINDINGS: BRAIN AND VENTRICLES: Chronic left superior convexity craniotomy. Underlying resection site, regional T2 and FLAIR hyperintensity remained stable prior to contrast. No regional mass effect and stable mild ex vacuo enlargement of the underlying left lateral ventricle there. Postcontrast stable mild curvilinear regional probably vascular related enhancement. No suspicious enhancement or DWI changes. Stable underlying brain volume. No new signal abnormality. Stable mild left frontal lobe postoperative hemosiderin. No abnormal dural thickening. No acute infarct. No acute intracranial hemorrhage. No mass effect or midline shift. No hydrocephalus. The sella is unremarkable. Normal flow voids. Major dural venous sinuses are enhancing and appear to be patent. ORBITS: No acute abnormality. SINUSES: No acute abnormality. BONES AND SOFT TISSUES: Negative visible cervical spine and spinal cord. Stable bone marrow signal. No acute soft tissue abnormality. IMPRESSION: 1. Continued stable and satisfactory post-treatment appearance of the brain. 2. No new intracranial abnormality. Electronically signed by: Helayne Hurst MD 07/17/2024 11:43 AM EST RP Workstation: [id number]    Assessment/Plan Oligodendroglioma, IDH gene mutant and 1p/19q-codeleted  (HCC)  Maureen Key is clinically stable today.  MRI demonstrates stable findings.  Blood counts are improved today.  We recommended remaining on imaging surveillance only.  With additional progression we would likely transition to IDH inhibitor.  She is agreeable with this.  Recommended continuing propanolol 40mg  BID for headache prevention.    We ask that Maureen Key return to clinic in 4 months with MRI brain, or sooner as needed.   All questions were answered. The patient knows to call the clinic with any problems, questions or concerns. No barriers to learning were detected.  I have spent a total of 40 minutes of face-to-face and non-face-to-face time, excluding clinical staff time, preparing to see patient, ordering tests and/or medications, counseling the patient, and independently interpreting results and communicating results to the patient/family/caregiver    Randall Colden K Marlaya Turck, MD Medical Director of Neuro-Oncology [facility] 07/23/2024 8:56 AM "

## 2024-07-24 ENCOUNTER — Telehealth: Payer: Self-pay | Admitting: Internal Medicine

## 2024-07-24 NOTE — Telephone Encounter (Signed)
 Scheduled patient for next appointment in 4 months. Called and spoke with the patient, she is aware.

## 2024-07-25 ENCOUNTER — Other Ambulatory Visit: Payer: Self-pay

## 2024-08-01 ENCOUNTER — Ambulatory Visit

## 2024-08-01 VITALS — BP 100/70 | HR 73 | Ht 62.0 in | Wt 118.4 lb

## 2024-08-01 DIAGNOSIS — Z0000 Encounter for general adult medical examination without abnormal findings: Secondary | ICD-10-CM | POA: Diagnosis not present

## 2024-08-01 NOTE — Progress Notes (Signed)
 "  Chief Complaint  Patient presents with   Medicare Wellness     Subjective:   Maureen Key is a 44 y.o. female who presents for a [employer] Visit.  Visit info / Clinical Intake: Medicare Wellness Visit Type:: Subsequent Annual Wellness Visit Persons participating in visit and providing information:: patient Medicare Wellness Visit Mode:: In-person (required for WTM) Interpreter Needed?: No Pre-visit prep was completed: yes AWV questionnaire completed by patient prior to visit?: yes Date:: 07/28/24 Living arrangements:: (Patient-Rptd) with family/others Patient's Overall Health Status Rating: (Patient-Rptd) good Typical amount of pain: (Patient-Rptd) some Does pain affect daily life?: (!) (Patient-Rptd) yes Are you currently prescribed opioids?: no  Dietary Habits and Nutritional Risks How many meals a day?: (Patient-Rptd) 3 Eats fruit and vegetables daily?: (Patient-Rptd) yes Most meals are obtained by: (Patient-Rptd) preparing own meals; eating out In the last 2 weeks, have you had any of the following?: none Diabetic:: no  Functional Status Activities of Daily Living (to include ambulation/medication): (Patient-Rptd) Independent Ambulation: (Patient-Rptd) Independent Medication Administration: (Patient-Rptd) Independent Home Management (perform basic housework or laundry): (Patient-Rptd) Independent Manage your own finances?: (Patient-Rptd) yes Primary transportation is: (Patient-Rptd) driving; family / friends Concerns about vision?: no *vision screening is required for WTM* Concerns about hearing?: no  Fall Screening Falls in the past year?: (Patient-Rptd) 0 Number of falls in past year: 0 Was there an injury with Fall?: 0 Fall Risk Category Calculator: 0 Patient Fall Risk Level: Low Fall Risk  Fall Risk Patient at Risk for Falls Due to: No Fall Risks Fall risk Follow up: Falls evaluation completed; Falls prevention discussed  Home and  Transportation Safety: All rugs have non-skid backing?: (Patient-Rptd) yes All stairs or steps have railings?: (Patient-Rptd) yes Grab bars in the bathtub or shower?: (Patient-Rptd) yes Have non-skid surface in bathtub or shower?: (Patient-Rptd) yes Good home lighting?: (Patient-Rptd) yes Regular seat belt use?: (Patient-Rptd) yes Hospital stays in the last year:: (Patient-Rptd) no  Cognitive Assessment Difficulty concentrating, remembering, or making decisions? : (Patient-Rptd) no Will 6CIT or Mini Cog be Completed: no 6CIT or Mini Cog Declined: patient alert, oriented, able to answer questions appropriately and recall recent events  Advance Directives (For Healthcare) Does Patient Have a Medical Advance Directive?: No Would patient like information on creating a medical advance directive?: No - Patient declined  Reviewed/Updated  Reviewed/Updated: Reviewed All (Medical, Surgical, Family, Medications, Allergies, Care Teams, Patient Goals)    Allergies (verified) Patient has no known allergies.   Current Medications (verified) Outpatient Encounter Medications as of 08/01/2024  Medication Sig   Probiotic Product (PROBIOTIC DAILY PO) Take 1 tablet by mouth daily.   propranolol  (INDERAL ) 40 MG tablet Take 1 tablet (40 mg total) by mouth 2 (two) times daily. For headache prevention   VITAMIN D  PO Take by mouth.   ondansetron  (ZOFRAN ) 8 MG tablet Take 1 tablet (8 mg total) by mouth every 8 (eight) hours as needed for nausea or vomiting. May take 30-60 minutes prior to Temodar  administration if nausea/vomiting occurs as needed. (Patient not taking: Reported on 08/01/2024)   No facility-administered encounter medications on file as of 08/01/2024.    History: Past Medical History:  Diagnosis Date   Anxiety 2008   Blood transfusion without reported diagnosis    last PPH delivery   History of placenta accreta in prior pregnancy, currently pregnant    History of sepsis     Oligodendroglioma (HCC)    Sickle cell trait    Past Surgical History:  Procedure  Laterality Date   ABDOMINAL HYSTERECTOMY  05/12/2017   Procedure: HYSTERECTOMY ABDOMINAL;  Surgeon: Edsel Norleen GAILS, MD;  Location: [facility] BIRTHING SUITES;  Service: Obstetrics;;   APPLICATION OF CRANIAL NAVIGATION N/A 02/04/2020   Procedure: APPLICATION OF CRANIAL NAVIGATION;  Surgeon: Cheryle Debby LABOR, MD;  Location: [facility] OR;  Service: Neurosurgery;  Laterality: N/A;  APPLICATION OF CRANIAL NAVIGATION   BRAIN SURGERY  01/05/2020   CERVICAL CONE BIOPSY     CESAREAN SECTION     CESAREAN SECTION N/A 05/12/2017   Procedure: CESAREAN SECTION WITH Hysterectomy;  Surgeon: Edsel Norleen GAILS, MD;  Location: [facility] BIRTHING SUITES;  Service: Obstetrics;  Laterality: N/A;   CRANIOTOMY Left 02/04/2020   Procedure: LEFT CRANIOTOMY FOR TUMOR RESECTION;  Surgeon: Cheryle Debby LABOR, MD;  Location: [facility] OR;  Service: Neurosurgery;  Laterality: Left;  LEFT CRANIOTOMY FOR TUMOR RESECTION   TIBIA IM NAIL INSERTION Right 12/13/2019   Procedure: INTRAMEDULLARY (IM) NAIL TIBIAL;  Surgeon: Kendal Franky SQUIBB, MD;  Location: [facility] OR;  Service: Orthopedics;  Laterality: Right;   Family History  Problem Relation Age of Onset   Hypertension Mother    HIV/AIDS Father    Cancer Father        lung   Diabetes Sister    Diabetes Maternal Aunt    Diabetes Maternal Uncle    Cancer Paternal Aunt    Cancer Paternal Uncle    Diabetes Maternal Grandmother    Alzheimer's disease Paternal Grandmother    Social History   Occupational History   Occupation: Disablied  Tobacco Use   Smoking status: Former    Current packs/day: 0.00    Average packs/day: 0.3 packs/day for 15.0 years (3.8 ttl pk-yrs)    Types: Cigarettes    Start date: 03/08/2002    Quit date: 03/08/2017    Years since quitting: 7.4   Smokeless tobacco: Never   Tobacco comments:    3 cigarettes a day  Vaping Use   Vaping status: Never Used  Substance and Sexual Activity    Alcohol use: Not Currently    Alcohol/week: 1.0 standard drink of alcohol    Types: 1 Glasses of wine per week    Comment: On occasions   Drug use: Not Currently    Frequency: 2.0 times per week    Types: Marijuana    Comment: last used 04/09/17   Sexual activity: Yes    Birth control/protection: Surgical   Tobacco Counseling Counseling given: Not Answered Tobacco comments: 3 cigarettes a day  SDOH Screenings   Food Insecurity: No Food Insecurity (07/28/2024)  Housing: Low Risk (07/28/2024)  Transportation Needs: No Transportation Needs (07/28/2024)  Utilities: Not At Risk (08/01/2024)  Alcohol Screen: Low Risk (07/28/2024)  Depression (PHQ2-9): Low Risk (08/01/2024)  Financial Resource Strain: Low Risk (07/28/2024)  Physical Activity: Insufficiently Active (07/28/2024)  Social Connections: Moderately Integrated (07/28/2024)  Stress: No Stress Concern Present (07/28/2024)  Tobacco Use: Medium Risk (08/01/2024)  Health Literacy: Adequate Health Literacy (08/01/2024)   See flowsheets for full screening details  Depression Screen PHQ 2 & 9 Depression Scale- Over the past 2 weeks, how often have you been bothered by any of the following problems? Little interest or pleasure in doing things: 0 Feeling down, depressed, or hopeless (PHQ Adolescent also includes...irritable): 0 PHQ-2 Total Score: 0 Trouble falling or staying asleep, or sleeping too much: 0 Feeling tired or having little energy: 0 Poor appetite or overeating (PHQ Adolescent also includes...weight loss): 0 Feeling bad about yourself - or that you are  a failure or have let yourself or your family down: 0 Trouble concentrating on things, such as reading the newspaper or watching television (PHQ Adolescent also includes...like school work): 0 Moving or speaking so slowly that other people could have noticed. Or the opposite - being so fidgety or restless that you have been moving around a lot more than usual: 0 Thoughts that you  would be better off dead, or of hurting yourself in some way: 0 PHQ-9 Total Score: 0 If you checked off any problems, how difficult have these problems made it for you to do your work, take care of things at home, or get along with other people?: Not difficult at all  Depression Treatment Depression Interventions/Treatment : EYV7-0 Score <4 Follow-up Not Indicated     Goals Addressed               This Visit's Progress     Patient Stated (pt-stated)        To continue to stay healthy/2026             Objective:    Today's Vitals   08/01/24 0853  BP: 100/70  Pulse: 73  SpO2: 100%  Weight: 118 lb 6.4 oz (53.7 kg)  Height: 5' 2 (1.575 m)   Body mass index is 21.66 kg/m.  Hearing/Vision screen Hearing Screening - Comments:: Denies hearing difficulties   Vision Screening - Comments:: Denies vision issues.  Immunizations and Health Maintenance Health Maintenance  Topic Date Due   COVID-19 Vaccine (1) Never done   Hepatitis C Screening  Never done   Pneumococcal Vaccine (1 of 2 - PCV) Never done   Hepatitis B Vaccines 19-59 Average Risk (1 of 3 - 19+ 3-dose series) Never done   Influenza Vaccine  02/09/2024   Mammogram  11/09/2024   Medicare Annual Wellness (AWV)  08/01/2025   DTaP/Tdap/Td (2 - Td or Tdap) 05/16/2027   Cervical Cancer Screening (HPV/Pap Cotest)  01/10/2028   HPV VACCINES (No Doses Required) Completed   HIV Screening  Completed   Meningococcal B Vaccine  Aged Out        Assessment/Plan:  This is a routine wellness examination for Maureen Key.  Patient Care Team: Lendia Boby CROME, NP-C as PCP - General (Family Medicine) Buckley Zachary K, MD as Consulting Physician (Psychiatry)  I have personally reviewed and noted the following in the patients chart:   Medical and social history Use of alcohol, tobacco or illicit drugs  Current medications and supplements including opioid prescriptions. Functional ability and status Nutritional  status Physical activity Advanced directives List of other physicians Hospitalizations, surgeries, and ER visits in previous 12 months Vitals Screenings to include cognitive, depression, and falls Referrals and appointments  No orders of the defined types were placed in this encounter.  In addition, I have reviewed and discussed with patient certain preventive protocols, quality metrics, and best practice recommendations. A written personalized care plan for preventive services as well as general preventive health recommendations were provided to patient.   Maureen Key, CMA   08/01/2024   Return in 1 year (on 08/01/2025).  After Visit Summary: (MyChart) Due to this being a telephonic visit, the after visit summary with patients personalized plan was offered to patient via MyChart   Nurse Notes: Patient is due for a pneumonia vaccine, a Hep B vaccine and a Hep C screening.  She would like to discuss all due vaccines and get Hep C screening during her next office visit with provider.  Patient declines the Flu vaccine. She had no other concerns to address today. "

## 2024-08-01 NOTE — Patient Instructions (Addendum)
 Maureen Key,  Thank you for taking the time for your Medicare Wellness Visit. I appreciate your continued commitment to your health goals. Please review the care plan we discussed, and feel free to reach out if I can assist you further.  Please note that Annual Wellness Visits do not include a physical exam. Some assessments may be limited, especially if the visit was conducted virtually. If needed, we may recommend an in-person follow-up with your provider.  Ongoing Care Seeing your primary care provider every 3 to 6 months helps us  monitor your health and provide consistent, personalized care. Next office visit on 08/07/2024.  You are due for a pneumonia vaccine and a Hep B vaccine, please discuss with provider during your next office visit.  You are also due for a Hep C screening which you can discuss with provider during your next office visit.   Referrals If a referral was made during today's visit and you haven't received any updates within two weeks, please contact the referred provider directly to check on the status.  Recommended Screenings:  Health Maintenance  Topic Date Due   COVID-19 Vaccine (1) Never done   Hepatitis C Screening  Never done   Pneumococcal Vaccine (1 of 2 - PCV) Never done   Hepatitis B Vaccine (1 of 3 - 19+ 3-dose series) Never done   Flu Shot  02/09/2024   Medicare Annual Wellness Visit  07/13/2024   Breast Cancer Screening  11/09/2024   DTaP/Tdap/Td vaccine (2 - Td or Tdap) 05/16/2027   Pap with HPV screening  01/10/2028   HPV Vaccine (No Doses Required) Completed   HIV Screening  Completed   Meningitis B Vaccine  Aged Out       07/28/2024   12:06 PM  Advanced Directives  Does Patient Have a Medical Advance Directive? No    Vision: Annual vision screenings are recommended for early detection of glaucoma, cataracts, and diabetic retinopathy. These exams can also reveal signs of chronic conditions such as diabetes and high blood pressure.  Dental:  Annual dental screenings help detect early signs of oral cancer, gum disease, and other conditions linked to overall health, including heart disease and diabetes.  Please see the attached documents for additional preventive care recommendations.

## 2024-08-07 ENCOUNTER — Ambulatory Visit: Admitting: Family Medicine

## 2024-08-07 ENCOUNTER — Encounter: Payer: Self-pay | Admitting: Family Medicine

## 2024-08-07 VITALS — BP 112/74 | HR 95 | Temp 97.6°F | Ht 62.0 in | Wt 123.0 lb

## 2024-08-07 DIAGNOSIS — Z87898 Personal history of other specified conditions: Secondary | ICD-10-CM | POA: Diagnosis not present

## 2024-08-07 DIAGNOSIS — G43709 Chronic migraine without aura, not intractable, without status migrainosus: Secondary | ICD-10-CM

## 2024-08-07 DIAGNOSIS — C719 Malignant neoplasm of brain, unspecified: Secondary | ICD-10-CM

## 2024-08-07 DIAGNOSIS — Z83438 Family history of other disorder of lipoprotein metabolism and other lipidemia: Secondary | ICD-10-CM

## 2024-08-07 DIAGNOSIS — Z1509 Genetic susceptibility to other malignant neoplasm: Secondary | ICD-10-CM

## 2024-08-07 DIAGNOSIS — Z1159 Encounter for screening for other viral diseases: Secondary | ICD-10-CM

## 2024-08-07 DIAGNOSIS — E559 Vitamin D deficiency, unspecified: Secondary | ICD-10-CM

## 2024-08-07 DIAGNOSIS — Z136 Encounter for screening for cardiovascular disorders: Secondary | ICD-10-CM | POA: Diagnosis not present

## 2024-08-07 DIAGNOSIS — Q9359 Other deletions of part of a chromosome: Secondary | ICD-10-CM

## 2024-08-07 LAB — COMPREHENSIVE METABOLIC PANEL WITH GFR
ALT: 72 U/L — ABNORMAL HIGH (ref 3–35)
AST: 60 U/L — ABNORMAL HIGH (ref 5–37)
Albumin: 4.4 g/dL (ref 3.5–5.2)
Alkaline Phosphatase: 64 U/L (ref 39–117)
BUN: 11 mg/dL (ref 6–23)
CO2: 31 meq/L (ref 19–32)
Calcium: 9.8 mg/dL (ref 8.4–10.5)
Chloride: 106 meq/L (ref 96–112)
Creatinine, Ser: 0.68 mg/dL (ref 0.40–1.20)
GFR: 107.74 mL/min
Glucose, Bld: 93 mg/dL (ref 70–99)
Potassium: 3.9 meq/L (ref 3.5–5.1)
Sodium: 140 meq/L (ref 135–145)
Total Bilirubin: 0.2 mg/dL (ref 0.2–1.2)
Total Protein: 7.1 g/dL (ref 6.0–8.3)

## 2024-08-07 LAB — LIPID PANEL
Cholesterol: 190 mg/dL (ref 28–200)
HDL: 54.3 mg/dL
LDL Cholesterol: 115 mg/dL — ABNORMAL HIGH (ref 10–99)
NonHDL: 135.71
Total CHOL/HDL Ratio: 3
Triglycerides: 104 mg/dL (ref 10.0–149.0)
VLDL: 20.8 mg/dL (ref 0.0–40.0)

## 2024-08-07 NOTE — Progress Notes (Signed)
 "  Subjective:     Patient ID: Maureen Key, female    DOB: [birth year], 44 y.o.   MRN: [id number]  Chief Complaint  Patient presents with   Medical Management of Chronic Issues    HPI  Discussed the use of AI scribe software for clinical note transcription with the patient, who gave verbal consent to proceed.  History of Present Illness Maureen Key is a 44 year old female who presents for follow-up on chronic health conditions.  Brain tumor surveillance and hematologic status - Brain tumor surveillance interval extended to every four months - Recent laboratory evaluation showed normal CBC blood counts per patient. Done with oncologist.  - Weight and appetite have improved  Vitamin d  deficiency - Vitamin D  deficiency with prior level of 10 ng/mL in May 2024 - Currently taking vitamin D  supplementation - Energy and appetite are good - No current symptoms   Chronic migraines - Chronic migraines managed with propranolol  - Headaches are controlled on current regimen  Tobacco use and lipid history - No tobacco use - No personal history of hyperlipidemia - Family history of hyperlipidemia in mother and grandfather     Health Maintenance Due  Topic Date Due   Hepatitis C Screening  Never done   Pneumococcal Vaccine (1 of 2 - PCV) Never done   Hepatitis B Vaccines 19-59 Average Risk (1 of 3 - 19+ 3-dose series) Never done    Past Medical History:  Diagnosis Date   Anxiety 2008   Blood transfusion without reported diagnosis    last PPH delivery   History of placenta accreta in prior pregnancy, currently pregnant    History of sepsis    Oligodendroglioma (HCC)    Sickle cell trait     Past Surgical History:  Procedure Laterality Date   ABDOMINAL HYSTERECTOMY  05/12/2017   Procedure: HYSTERECTOMY ABDOMINAL;  Surgeon: Edsel Norleen GAILS, MD;  Location: [facility]IRTHING SU[facility];  Service: Obstetrics;;   APPLICATION OF CRANIAL NAVIGATION N/A 02/04/2020    Procedure: APPLICATION OF CRANIAL NAVIGATION;  Surgeon: Cheryle Debby DELENA, MD;  Location: [facility] OR;  Service: Neurosurgery;  Laterality: N/A;  APPLICATION OF CRANIAL NAVIGATION   BRAIN SURGERY  01/05/2020   CERVICAL CONE BIOPSY     CESAREAN SECTION     CESAREAN SECTION N/A 05/12/2017   Procedure: CESAREAN SECTION WITH Hysterectomy;  Surgeon: Edsel Norleen GAILS, MD;  Location: [facility] BIRTHING SUIT[facility];  Service: Obstetrics;  Laterality: N/A;   CRANIOTOMY Left 02/04/2020   Procedure: LEFT CRANIOTOMY FOR TUMOR RESECTION;  Surgeon: Cheryle Debby DELENA, MD;  Location: [facility] OR;  Service: Neurosurgery;  Laterality: Left;  LEFT CRANIOTOMY FOR TUMOR RESECTION   TIBIA IM NAIL INSERTION Right 12/13/2019   Procedure: INTRAMEDULLARY (IM) NAIL TIBIAL;  Surgeon: Kendal Franky SQUIBB, MD;  Location: [facility] OR;  Service: Orthopedics;  Laterality: Right;    Family History  Problem Relation Age of Onset   Hypertension Mother    HIV/AIDS Father    Cancer Father        lung   Diabetes Sister    Diabetes Maternal Aunt    Diabetes Maternal Uncle    Cancer Paternal Aunt    Cancer Paternal Uncle    Diabetes Maternal Grandmother    Alzheimer's disease Paternal Grandmother     Social History   Socioeconomic History   Marital status: Single    Spouse name: Not on file   Number of children: 4   Years of education: Not on file  Highest education level: Some college, no degree  Occupational History   Occupation: Disablied  Tobacco Use   Smoking status: Former    Current packs/day: 0.00    Average packs/day: 0.3 packs/day for 15.0 years (3.8 ttl pk-yrs)    Types: Cigarettes    Start date: 03/08/2002    Quit date: 03/08/2017    Years since quitting: 7.4   Smokeless tobacco: Never   Tobacco comments:    3 cigarettes a day  Vaping Use   Vaping status: Never Used  Substance and Sexual Activity   Alcohol use: Not Currently    Alcohol/week: 1.0 standard drink of alcohol    Types: 1 Glasses of wine per week    Comment: On  occasions   Drug use: Not Currently    Frequency: 2.0 times per week    Types: Marijuana    Comment: last used 04/09/17   Sexual activity: Yes    Birth control/protection: Surgical  Other Topics Concern   Not on file  Social History Narrative   Live with children/2026   Social Drivers of Health   Tobacco Use: Medium Risk (08/07/2024)   Patient History    Smoking Tobacco Use: Former    Smokeless Tobacco Use: Never    Passive Exposure: Not on Actuary Strain: Low Risk (07/28/2024)   Overall Financial Resource Strain (CARDIA)    Difficulty of Paying Living Expenses: Not very hard  Food Insecurity: No Food Insecurity (07/28/2024)   Epic    Worried About Radiation Protection Practitioner of Food in the Last Year: Never true    Ran Out of Food in the Last Year: Never true  Transportation Needs: No Transportation Needs (07/28/2024)   Epic    Lack of Transportation (Medical): No    Lack of Transportation (Non-Medical): No  Physical Activity: Insufficiently Active (07/28/2024)   Exercise Vital Sign    Days of Exercise per Week: 2 days    Minutes of Exercise per Session: 30 min  Stress: No Stress Concern Present (07/28/2024)   [employer] of Occupational Health - Occupational Stress Questionnaire    Feeling of Stress: Only a little  Social Connections: Moderately Integrated (07/28/2024)   Social Connection and Isolation Panel    Frequency of Communication with Friends and Family: Three times a week    Frequency of Social Gatherings with Friends and Family: Once a week    Attends Religious Services: More than 4 times per year    Active Member of Clubs or Organizations: Yes    Attends Banker Meetings: More than 4 times per year    Marital Status: Never married  Intimate Partner Violence: Patient Unable To Answer (08/01/2024)   Epic    Fear of Current or Ex-Partner: Patient unable to answer    Emotionally Abused: Patient unable to answer    Physically Abused: Patient unable  to answer    Sexually Abused: Patient unable to answer  Depression (PHQ2-9): Low Risk (08/01/2024)   Depression (PHQ2-9)    PHQ-2 Score: 0  Alcohol Screen: Low Risk (07/28/2024)   Alcohol Screen    Last Alcohol Screening Score (AUDIT): 0  Housing: Low Risk (07/28/2024)   Epic    Unable to Pay for Housing in the Last Year: No    Number of Times Moved in the Last Year: 1    Homeless in the Last Year: No  Utilities: Not At Risk (08/01/2024)   Epic    Threatened with loss of utilities: No  Health Literacy: Adequate Health Literacy (08/01/2024)   B1300 Health Literacy    Frequency of need for help with medical instructions: Never    Outpatient Medications Prior to Visit  Medication Sig Dispense Refill   Probiotic Product (PROBIOTIC DAILY PO) Take 1 tablet by mouth daily.     propranolol  (INDERAL ) 40 MG tablet Take 1 tablet (40 mg total) by mouth 2 (two) times daily. For headache prevention 60 tablet 0   VITAMIN D  PO Take by mouth.     ondansetron  (ZOFRAN ) 8 MG tablet Take 1 tablet (8 mg total) by mouth every 8 (eight) hours as needed for nausea or vomiting. May take 30-60 minutes prior to Temodar  administration if nausea/vomiting occurs as needed. (Patient not taking: Reported on 08/07/2024) 30 tablet 1   No facility-administered medications prior to visit.    Allergies[1]  Review of Systems  Constitutional:  Negative for chills, fever, malaise/fatigue and weight loss.  Eyes:  Negative for blurred vision and double vision.  Respiratory:  Negative for shortness of breath.   Cardiovascular:  Negative for chest pain, palpitations and leg swelling.  Gastrointestinal:  Negative for abdominal pain, constipation, diarrhea, nausea and vomiting.  Genitourinary:  Negative for dysuria, frequency and urgency.  Neurological:  Negative for dizziness and focal weakness.  Psychiatric/Behavioral:  Negative for depression. The patient is not nervous/anxious.        Objective:    Physical  Exam Constitutional:      General: She is not in acute distress.    Appearance: She is not ill-appearing.  HENT:     Mouth/Throat:     Mouth: Mucous membranes are moist.     Pharynx: Oropharynx is clear.  Eyes:     Extraocular Movements: Extraocular movements intact.     Conjunctiva/sclera: Conjunctivae normal.     Pupils: Pupils are equal, round, and reactive to light.  Cardiovascular:     Rate and Rhythm: Normal rate and regular rhythm.  Pulmonary:     Effort: Pulmonary effort is normal.     Breath sounds: Normal breath sounds.  Musculoskeletal:     Cervical back: Normal range of motion and neck supple.     Right lower leg: No edema.     Left lower leg: No edema.  Skin:    General: Skin is warm and dry.  Neurological:     General: No focal deficit present.     Mental Status: She is alert and oriented to person, place, and time.     Cranial Nerves: No cranial nerve deficit.     Motor: No weakness.     Coordination: Coordination normal.     Gait: Gait normal.  Psychiatric:        Mood and Affect: Mood normal.        Behavior: Behavior normal.        Thought Content: Thought content normal.      BP 112/74   Pulse 95   Temp 97.6 F (36.4 C) (Temporal)   Ht 5' 2 (1.575 m)   Wt 123 lb (55.8 kg)   LMP 08/05/2016   SpO2 98%   BMI 22.50 kg/m  Wt Readings from Last 3 Encounters:  08/07/24 123 lb (55.8 kg)  08/01/24 118 lb 6.4 oz (53.7 kg)  07/23/24 117 lb (53.1 kg)       Assessment & Plan:   Problem List Items Addressed This Visit     History of brain tumor   Relevant Orders   Comprehensive metabolic panel  with GFR   Oligodendroglioma, IDH gene mutant and 1p/19q-codeleted (HCC)   Vitamin D  deficiency - Primary   Relevant Orders   VITAMIN D  25 Hydroxy (Vit-D Deficiency, Fractures)   Other Visit Diagnoses       Encounter for screening for other viral diseases       Relevant Orders   Hepatitis C antibody     Family history of hyperlipidemia        Relevant Orders   Lipid panel     Screening for heart disease       Relevant Orders   Lipid panel     Chronic migraine without aura without status migrainosus, not intractable           Assessment and Plan Assessment & Plan Oligodendroglioma of brain Oligodendroglioma is well-managed with follow-up every four months. Temozolomide  was discontinued due to low platelet count. Recent blood work shows good blood counts and improved appetite. No new symptoms reported. - Continue follow-up every four months with oncologist.  Vitamin D  deficiency Vitamin D  level was 10 in May 2024. Currently taking vitamin D  supplements.  - Ordered blood test to check current vitamin D  level. - Will adjust vitamin D  supplementation if levels remain low.  Chronic migraine Chronic migraines managed with propranolol . No new symptoms reported. - Continue propranolol  for migraine management per oncologist   General Health Maintenance Pneumonia vaccine recommended. Declined at this time. Hepatitis C screening and cholesterol levels to be checked. Up to date on Pap smear and mammogram. Colonoscopy not yet required. - Offered pneumonia vaccine if she changes her mind. - Ordered blood tests for hepatitis C screening and cholesterol levels.     I am having Maureen Key maintain her VITAMIN D  PO, ondansetron , Probiotic Product (PROBIOTIC DAILY PO), and propranolol .  No orders of the defined types were placed in this encounter.      [1] No Known Allergies  "

## 2024-08-07 NOTE — Patient Instructions (Signed)
 Please go downstairs for labs.

## 2024-08-08 ENCOUNTER — Other Ambulatory Visit: Payer: Self-pay | Admitting: Family Medicine

## 2024-08-08 ENCOUNTER — Encounter: Payer: Self-pay | Admitting: Internal Medicine

## 2024-08-08 ENCOUNTER — Ambulatory Visit: Payer: Self-pay | Admitting: Family Medicine

## 2024-08-08 DIAGNOSIS — R7989 Other specified abnormal findings of blood chemistry: Secondary | ICD-10-CM

## 2024-08-08 LAB — HEPATITIS C ANTIBODY: Hepatitis C Ab: NONREACTIVE

## 2024-08-08 LAB — VITAMIN D 25 HYDROXY (VIT D DEFICIENCY, FRACTURES): VITD: 11.67 ng/mL — ABNORMAL LOW (ref 30.00–100.00)

## 2024-08-08 MED ORDER — VITAMIN D (ERGOCALCIFEROL) 1.25 MG (50000 UNIT) PO CAPS: 50000.0000 [IU] | ORAL_CAPSULE | ORAL | 1 refills | Status: AC

## 2024-08-08 NOTE — Progress Notes (Signed)
 Her vitamin D  is severely low.  I sent high-dose prescription vitamin D  to her pharmacy.  She will take this once weekly for 3 months. Also, her liver enzymes are elevated.  Please ask her to follow-up for repeat blood work, lab only visit in 1 to 2 weeks to reassess her liver function.

## 2024-08-13 ENCOUNTER — Other Ambulatory Visit: Payer: Self-pay

## 2024-08-14 ENCOUNTER — Other Ambulatory Visit

## 2024-08-14 ENCOUNTER — Ambulatory Visit: Payer: Self-pay | Admitting: Family Medicine

## 2024-08-14 DIAGNOSIS — R7989 Other specified abnormal findings of blood chemistry: Secondary | ICD-10-CM

## 2024-08-14 LAB — HEPATIC FUNCTION PANEL
ALT: 160 U/L — ABNORMAL HIGH (ref 3–35)
AST: 70 U/L — ABNORMAL HIGH (ref 5–37)
Albumin: 4.2 g/dL (ref 3.5–5.2)
Alkaline Phosphatase: 62 U/L (ref 39–117)
Bilirubin, Direct: 0.1 mg/dL (ref 0.1–0.3)
Total Bilirubin: 0.3 mg/dL (ref 0.2–1.2)
Total Protein: 6.7 g/dL (ref 6.0–8.3)

## 2024-08-14 NOTE — Progress Notes (Signed)
 Please schedule her to come in to see me Thursday or Friday for higher liver enzymes.

## 2024-08-16 ENCOUNTER — Ambulatory Visit: Admitting: Family Medicine

## 2024-08-20 ENCOUNTER — Ambulatory Visit: Admitting: Family Medicine

## 2024-11-25 ENCOUNTER — Inpatient Hospital Stay: Admitting: Internal Medicine
